# Patient Record
Sex: Male | Born: 1960 | Race: White | Hispanic: No | Marital: Married | Smoking: Former smoker
Health system: Southern US, Community
[De-identification: ages and names within clinical notes are randomized; demographics above are authoritative.]

## PROBLEM LIST (undated history)

## (undated) DIAGNOSIS — K7460 Unspecified cirrhosis of liver: Secondary | ICD-10-CM

## (undated) DIAGNOSIS — K029 Dental caries, unspecified: Secondary | ICD-10-CM

## (undated) DIAGNOSIS — K759 Inflammatory liver disease, unspecified: Secondary | ICD-10-CM

## (undated) DIAGNOSIS — F1010 Alcohol abuse, uncomplicated: Secondary | ICD-10-CM

## (undated) DIAGNOSIS — M1990 Unspecified osteoarthritis, unspecified site: Secondary | ICD-10-CM

---

## 2014-05-20 ENCOUNTER — Inpatient Hospital Stay (HOSPITAL_COMMUNITY)
Admission: AD | Admit: 2014-05-20 | Discharge: 2014-06-18 | DRG: 025 | Disposition: A | Discharge status: Hospice | Payer: Medicaid Other | Source: Other Acute Inpatient Hospital | Attending: Neurosurgery | Admitting: Neurosurgery

## 2014-05-20 DIAGNOSIS — R0902 Hypoxemia: Secondary | ICD-10-CM | POA: Insufficient documentation

## 2014-05-20 DIAGNOSIS — G936 Cerebral edema: Secondary | ICD-10-CM | POA: Diagnosis present

## 2014-05-20 DIAGNOSIS — G9389 Other specified disorders of brain: Secondary | ICD-10-CM | POA: Insufficient documentation

## 2014-05-20 DIAGNOSIS — K047 Periapical abscess without sinus: Secondary | ICD-10-CM | POA: Diagnosis present

## 2014-05-20 DIAGNOSIS — J690 Pneumonitis due to inhalation of food and vomit: Secondary | ICD-10-CM

## 2014-05-20 DIAGNOSIS — Z7901 Long term (current) use of anticoagulants: Secondary | ICD-10-CM | POA: Diagnosis not present

## 2014-05-20 DIAGNOSIS — F17210 Nicotine dependence, cigarettes, uncomplicated: Secondary | ICD-10-CM | POA: Diagnosis present

## 2014-05-20 DIAGNOSIS — F1020 Alcohol dependence, uncomplicated: Secondary | ICD-10-CM | POA: Diagnosis present

## 2014-05-20 DIAGNOSIS — K5900 Constipation, unspecified: Secondary | ICD-10-CM | POA: Diagnosis present

## 2014-05-20 DIAGNOSIS — Z515 Encounter for palliative care: Secondary | ICD-10-CM | POA: Insufficient documentation

## 2014-05-20 DIAGNOSIS — B3789 Other sites of candidiasis: Secondary | ICD-10-CM | POA: Diagnosis present

## 2014-05-20 DIAGNOSIS — C3411 Malignant neoplasm of upper lobe, right bronchus or lung: Secondary | ICD-10-CM | POA: Diagnosis present

## 2014-05-20 DIAGNOSIS — J3800 Paralysis of vocal cords and larynx, unspecified: Secondary | ICD-10-CM | POA: Diagnosis present

## 2014-05-20 DIAGNOSIS — R260 Ataxic gait: Secondary | ICD-10-CM | POA: Diagnosis present

## 2014-05-20 DIAGNOSIS — K029 Dental caries, unspecified: Secondary | ICD-10-CM | POA: Diagnosis present

## 2014-05-20 DIAGNOSIS — R739 Hyperglycemia, unspecified: Secondary | ICD-10-CM | POA: Diagnosis present

## 2014-05-20 DIAGNOSIS — G8918 Other acute postprocedural pain: Secondary | ICD-10-CM | POA: Diagnosis not present

## 2014-05-20 DIAGNOSIS — G939 Disorder of brain, unspecified: Secondary | ICD-10-CM | POA: Diagnosis present

## 2014-05-20 DIAGNOSIS — R918 Other nonspecific abnormal finding of lung field: Secondary | ICD-10-CM | POA: Insufficient documentation

## 2014-05-20 DIAGNOSIS — J9601 Acute respiratory failure with hypoxia: Secondary | ICD-10-CM | POA: Diagnosis not present

## 2014-05-20 DIAGNOSIS — K7030 Alcoholic cirrhosis of liver without ascites: Secondary | ICD-10-CM | POA: Diagnosis present

## 2014-05-20 DIAGNOSIS — Z886 Allergy status to analgesic agent status: Secondary | ICD-10-CM | POA: Diagnosis not present

## 2014-05-20 DIAGNOSIS — Z79899 Other long term (current) drug therapy: Secondary | ICD-10-CM | POA: Diagnosis not present

## 2014-05-20 DIAGNOSIS — E440 Moderate protein-calorie malnutrition: Secondary | ICD-10-CM | POA: Insufficient documentation

## 2014-05-20 DIAGNOSIS — D496 Neoplasm of unspecified behavior of brain: Secondary | ICD-10-CM

## 2014-05-20 DIAGNOSIS — Z881 Allergy status to other antibiotic agents status: Secondary | ICD-10-CM

## 2014-05-20 DIAGNOSIS — R42 Dizziness and giddiness: Secondary | ICD-10-CM

## 2014-05-20 DIAGNOSIS — D630 Anemia in neoplastic disease: Secondary | ICD-10-CM | POA: Diagnosis present

## 2014-05-20 DIAGNOSIS — R040 Epistaxis: Secondary | ICD-10-CM | POA: Diagnosis not present

## 2014-05-20 DIAGNOSIS — T380X5A Adverse effect of glucocorticoids and synthetic analogues, initial encounter: Secondary | ICD-10-CM | POA: Diagnosis present

## 2014-05-20 DIAGNOSIS — I10 Essential (primary) hypertension: Secondary | ICD-10-CM | POA: Diagnosis present

## 2014-05-20 DIAGNOSIS — B1920 Unspecified viral hepatitis C without hepatic coma: Secondary | ICD-10-CM | POA: Diagnosis present

## 2014-05-20 DIAGNOSIS — D6959 Other secondary thrombocytopenia: Secondary | ICD-10-CM | POA: Diagnosis present

## 2014-05-20 DIAGNOSIS — Z66 Do not resuscitate: Secondary | ICD-10-CM | POA: Diagnosis present

## 2014-05-20 DIAGNOSIS — J9811 Atelectasis: Secondary | ICD-10-CM | POA: Diagnosis not present

## 2014-05-20 DIAGNOSIS — C7931 Secondary malignant neoplasm of brain: Principal | ICD-10-CM | POA: Diagnosis present

## 2014-05-20 DIAGNOSIS — R451 Restlessness and agitation: Secondary | ICD-10-CM | POA: Diagnosis present

## 2014-05-20 DIAGNOSIS — R1312 Dysphagia, oropharyngeal phase: Secondary | ICD-10-CM | POA: Diagnosis present

## 2014-05-20 DIAGNOSIS — G893 Neoplasm related pain (acute) (chronic): Secondary | ICD-10-CM | POA: Diagnosis present

## 2014-05-20 DIAGNOSIS — Z452 Encounter for adjustment and management of vascular access device: Secondary | ICD-10-CM

## 2014-05-20 DIAGNOSIS — B370 Candidal stomatitis: Secondary | ICD-10-CM

## 2014-05-20 DIAGNOSIS — Z9889 Other specified postprocedural states: Secondary | ICD-10-CM

## 2014-05-20 DIAGNOSIS — Z880 Allergy status to penicillin: Secondary | ICD-10-CM | POA: Diagnosis not present

## 2014-05-20 DIAGNOSIS — C799 Secondary malignant neoplasm of unspecified site: Secondary | ICD-10-CM

## 2014-05-20 DIAGNOSIS — R7981 Abnormal blood-gas level: Secondary | ICD-10-CM

## 2014-05-20 DIAGNOSIS — M79609 Pain in unspecified limb: Secondary | ICD-10-CM | POA: Diagnosis not present

## 2014-05-20 HISTORY — DX: Inflammatory liver disease, unspecified: K75.9

## 2014-05-20 HISTORY — DX: Unspecified cirrhosis of liver: K74.60

## 2014-05-20 HISTORY — DX: Unspecified osteoarthritis, unspecified site: M19.90

## 2014-05-20 HISTORY — DX: Dental caries, unspecified: K02.9

## 2014-05-20 HISTORY — DX: Alcohol abuse, uncomplicated: F10.10

## 2014-05-20 MED ORDER — LORAZEPAM 1 MG PO TABS: 1.0000 mg | ORAL_TABLET | Freq: Four times a day (QID) | ORAL | Status: AC | PRN

## 2014-05-20 MED ORDER — FOLIC ACID 1 MG PO TABS
1.0000 mg | ORAL_TABLET | Freq: Every day | ORAL | Status: DC
  Administered 2014-05-21 – 2014-06-18 (×27): 1 mg via ORAL
  Filled 2014-05-20 (×29): qty 1

## 2014-05-20 MED ORDER — SODIUM CHLORIDE 0.9 % IV SOLN
INTRAVENOUS | Status: DC
  Administered 2014-05-20: 100 mL/h via INTRAVENOUS
  Administered 2014-05-22: 15:00:00 via INTRAVENOUS
  Administered 2014-05-22: 1000 mL via INTRAVENOUS
  Administered 2014-05-23 (×2): via INTRAVENOUS
  Administered 2014-05-24: 100 mL/h via INTRAVENOUS

## 2014-05-20 MED ORDER — VITAMIN B-1 100 MG PO TABS
100.0000 mg | ORAL_TABLET | Freq: Every day | ORAL | Status: DC
  Administered 2014-05-21 – 2014-06-18 (×27): 100 mg via ORAL
  Filled 2014-05-20 (×29): qty 1

## 2014-05-20 MED ORDER — ONDANSETRON HCL 4 MG PO TABS
4.0000 mg | ORAL_TABLET | Freq: Four times a day (QID) | ORAL | Status: DC | PRN
  Administered 2014-05-29 – 2014-06-02 (×2): 4 mg via ORAL
  Filled 2014-05-20 (×2): qty 1

## 2014-05-20 MED ORDER — THIAMINE HCL 100 MG/ML IJ SOLN
100.0000 mg | Freq: Every day | INTRAMUSCULAR | Status: DC
  Filled 2014-05-20: qty 2

## 2014-05-20 MED ORDER — ADULT MULTIVITAMIN W/MINERALS CH
1.0000 | ORAL_TABLET | Freq: Every day | ORAL | Status: DC
  Administered 2014-05-21 – 2014-06-18 (×27): 1 via ORAL
  Filled 2014-05-20 (×30): qty 1

## 2014-05-20 MED ORDER — ONDANSETRON HCL 4 MG/2ML IJ SOLN
4.0000 mg | Freq: Four times a day (QID) | INTRAMUSCULAR | Status: DC | PRN
  Administered 2014-05-20 – 2014-06-09 (×16): 4 mg via INTRAVENOUS
  Filled 2014-05-20 (×17): qty 2

## 2014-05-20 MED ORDER — LORAZEPAM 2 MG/ML IJ SOLN
1.0000 mg | Freq: Four times a day (QID) | INTRAMUSCULAR | Status: AC | PRN
  Administered 2014-05-21 (×2): 1 mg via INTRAVENOUS
  Filled 2014-05-20 (×2): qty 1

## 2014-05-20 MED ORDER — HEPARIN SODIUM (PORCINE) 5000 UNIT/ML IJ SOLN
5000.0000 [IU] | Freq: Three times a day (TID) | INTRAMUSCULAR | Status: DC
  Administered 2014-05-20 – 2014-05-31 (×29): 5000 [IU] via SUBCUTANEOUS
  Filled 2014-05-20 (×30): qty 1

## 2014-05-20 MED ORDER — MORPHINE SULFATE 2 MG/ML IJ SOLN
1.0000 mg | INTRAMUSCULAR | Status: DC | PRN
  Administered 2014-05-20 – 2014-05-30 (×39): 2 mg via INTRAVENOUS
  Administered 2014-05-31: 1 mg via INTRAVENOUS
  Administered 2014-05-31 – 2014-06-09 (×20): 2 mg via INTRAVENOUS
  Filled 2014-05-20 (×61): qty 1

## 2014-05-20 NOTE — H&P (Addendum)
Hospitalist Admission History and Physical  Patient name: Ray Garcia Medical record number: [id number] Date of birth: Feb 05, 1960 Age: 54 y.o. Gender: male  Primary Care Provider: Mliss Sax [name], MD  Chief Complaint: cerebellar mass   History of Present Illness:This is a 54 y.o. year old male with significant past medical history of cirrhosis, hepatitis C, HTN, dental caries/abscess, ETOH abuse presenting with cerebellar mass. Patient presented today at [facility] with chief complaint of recurrent intermittent headaches as well as recurrent nausea and dizziness. States that symptoms have been fairly persistent for over the past 2 weeks. No reported head trauma. Reports only minimal alcohol intake. One to 2 beers per day. Previous heavy drinker. No hemiparesis or confusion. Also with noted dental caries and dental abscess. Is currently on oral penicillin for this. Presents his [facility] afebrile, hemodynamically stable. A head CT obtained that [facility] showed abnormal appearance of the left cerebellar hemisphere concerning for underlying mass lesion with associated edema. Per report, ER physician assistant discussed case with on-call neurosurgery at [facility] recommended medical admission with formal neurosurgical consultation. Labs are pending.   Assessment and Plan: Ray Garcia is a 54 y.o. year old male presenting with cerebellar mass   Active Problems:   Cerebellar mass   1- Cerebellar mass -MRI brain w/ contrast per imaging recs -symptomatic control  -fairly benign exam  -NS consult in am   2-Cirrhosis -obtain LFTs -noted be followed at [facility]  3- Hep C -followed at [facility] -LFTs   4- HTN -pending formal vital signs and med rec  5-ETOH abuse -CIWA protocol   6-Dental Caries/Dental Abscess -cont pen VK   FEN/GI: heart healthy diet  Prophylaxis: sub q heparin  Disposition: pending further evaluation  Code Status:FUll Code    Patient Active  Problem List   Diagnosis Date Noted  . Cerebellar mass 05/20/2014   Past Medical History: 1-ETOH Abuse 2-Dental Caries   Past Surgical History: No past surgical history on file.  Social History: History   Social History  . Marital Status: Married    Spouse Name: N/A  . Number of Children: N/A  . Years of Education: N/A   Social History Main Topics  . Smoking status: Not on file  . Smokeless tobacco: Not on file  . Alcohol Use: Not on file  . Drug Use: Not on file  . Sexual Activity: Not on file   Other Topics Concern  . Not on file   Social History Narrative  . No narrative on file    Family History: Reviewed. No significant family history noted.  No family history on file.  Allergies: NKDA Allergies not on file  Current Facility-Administered Medications  Medication Dose Route Frequency Provider Last Rate Last Dose  . 0.9 %  sodium chloride infusion   Intravenous Continuous Deneise Lever, MD      . heparin injection 5,000 Units  5,000 Units Subcutaneous 3 times per day Deneise Lever, MD      . morphine 2 MG/ML injection 1-2 mg  1-2 mg Intravenous Q3H PRN Deneise Lever, MD      . ondansetron [facility]) tablet 4 mg  4 mg Oral Q6H PRN Deneise Lever, MD       Or  . ondansetron [facility]) injection 4 mg  4 mg Intravenous Q6H PRN Deneise Lever, MD       Review Of Systems: 12 point ROS negative except as noted above in HPI.  Physical Exam: Filed Vitals:  05/21/14 0541  BP: 132/83  Pulse: 62  Temp: 98.3 F (36.8 C)  Resp: 20    General: alert and cooperative HEENT: PERRLA, extra ocular movement intact and + dental caries Heart: S1, S2 normal, no murmur, rub or gallop, regular rate and rhythm Lungs: clear to auscultation, no wheezes or rales and unlabored breathing Abdomen: abdomen is soft without significant tenderness, masses, organomegaly or guarding Extremities: extremities normal, atraumatic, no cyanosis or edema Skin:no rashes Neurology:  grossly normal exam, + mild dizziness w/ ambulation   Labs and Imaging: Lab Results  Component Value Date/Time   NA 134* 05/21/2014 04:09 AM   K 3.8 05/21/2014 04:09 AM   CL 100 05/21/2014 04:09 AM   CO2 25 05/21/2014 04:09 AM   BUN 12 05/21/2014 04:09 AM   CREATININE 0.66 05/21/2014 04:09 AM   GLUCOSE 111* 05/21/2014 04:09 AM   Lab Results  Component Value Date   WBC 7.9 05/21/2014   HGB 14.3 05/21/2014   HCT 40.0 05/21/2014   MCV 88.5 05/21/2014   PLT 123* 05/21/2014    No results found.         Shanda Howells MD  Pager: [phone]

## 2014-05-21 ENCOUNTER — Inpatient Hospital Stay (HOSPITAL_COMMUNITY): Payer: Medicaid Other

## 2014-05-21 ENCOUNTER — Encounter (HOSPITAL_COMMUNITY): Payer: Self-pay | Admitting: Family Medicine

## 2014-05-21 DIAGNOSIS — I10 Essential (primary) hypertension: Secondary | ICD-10-CM

## 2014-05-21 DIAGNOSIS — F1010 Alcohol abuse, uncomplicated: Secondary | ICD-10-CM

## 2014-05-21 LAB — CBC
HCT: 40.9 % (ref 39.0–52.0)
HEMOGLOBIN: 14.6 g/dL (ref 13.0–17.0)
MCH: 31.9 pg (ref 26.0–34.0)
MCHC: 35.7 g/dL (ref 30.0–36.0)
MCV: 89.3 fL (ref 78.0–100.0)
Platelets: 124 10*3/uL — ABNORMAL LOW (ref 150–400)
RBC: 4.58 MIL/uL (ref 4.22–5.81)
RDW: 12.9 % (ref 11.5–15.5)
WBC: 7.9 10*3/uL (ref 4.0–10.5)

## 2014-05-21 LAB — COMPREHENSIVE METABOLIC PANEL
ALK PHOS: 57 U/L (ref 39–117)
ALT: 150 U/L — AB (ref 0–53)
AST: 84 U/L — ABNORMAL HIGH (ref 0–37)
Albumin: 3.4 g/dL — ABNORMAL LOW (ref 3.5–5.2)
Anion gap: 9 (ref 5–15)
BILIRUBIN TOTAL: 1.1 mg/dL (ref 0.3–1.2)
BUN: 12 mg/dL (ref 6–23)
CHLORIDE: 100 mmol/L (ref 96–112)
CO2: 25 mmol/L (ref 19–32)
Calcium: 8.7 mg/dL (ref 8.4–10.5)
Creatinine, Ser: 0.66 mg/dL (ref 0.50–1.35)
GLUCOSE: 111 mg/dL — AB (ref 70–99)
POTASSIUM: 3.8 mmol/L (ref 3.5–5.1)
Sodium: 134 mmol/L — ABNORMAL LOW (ref 135–145)
Total Protein: 6.8 g/dL (ref 6.0–8.3)

## 2014-05-21 LAB — CBC WITH DIFFERENTIAL/PLATELET
Basophils Absolute: 0 10*3/uL (ref 0.0–0.1)
Basophils Relative: 0 % (ref 0–1)
Eosinophils Absolute: 0.1 10*3/uL (ref 0.0–0.7)
Eosinophils Relative: 1 % (ref 0–5)
HEMATOCRIT: 40 % (ref 39.0–52.0)
Hemoglobin: 14.3 g/dL (ref 13.0–17.0)
Lymphocytes Relative: 32 % (ref 12–46)
Lymphs Abs: 2.5 10*3/uL (ref 0.7–4.0)
MCH: 31.6 pg (ref 26.0–34.0)
MCHC: 35.8 g/dL (ref 30.0–36.0)
MCV: 88.5 fL (ref 78.0–100.0)
MONO ABS: 0.8 10*3/uL (ref 0.1–1.0)
Monocytes Relative: 10 % (ref 3–12)
NEUTROS ABS: 4.6 10*3/uL (ref 1.7–7.7)
Neutrophils Relative %: 57 % (ref 43–77)
PLATELETS: 123 10*3/uL — AB (ref 150–400)
RBC: 4.52 MIL/uL (ref 4.22–5.81)
RDW: 12.9 % (ref 11.5–15.5)
WBC: 7.9 10*3/uL (ref 4.0–10.5)

## 2014-05-21 LAB — CREATININE, SERUM
CREATININE: 0.66 mg/dL (ref 0.50–1.35)
GFR calc Af Amer: >90 mL/min (ref >90)
GFR calc non Af Amer: >90 mL/min (ref >90)

## 2014-05-21 MED ORDER — ENSURE ENLIVE PO LIQD
237.0000 mL | Freq: Two times a day (BID) | ORAL | Status: DC
  Administered 2014-05-22 – 2014-06-09 (×28): 237 mL via ORAL

## 2014-05-21 MED ORDER — DEXAMETHASONE SODIUM PHOSPHATE 4 MG/ML IJ SOLN
4.0000 mg | Freq: Three times a day (TID) | INTRAMUSCULAR | Status: DC
  Administered 2014-05-21 – 2014-06-13 (×66): 4 mg via INTRAVENOUS
  Filled 2014-05-21 (×72): qty 1

## 2014-05-21 MED ORDER — IOHEXOL 300 MG/ML  SOLN: 25.0000 mL | INTRAMUSCULAR | Status: AC

## 2014-05-21 MED ORDER — GADOBENATE DIMEGLUMINE 529 MG/ML IV SOLN
20.0000 mL | Freq: Once | INTRAVENOUS | Status: AC
  Administered 2014-05-21: 20 mL via INTRAVENOUS

## 2014-05-21 MED ORDER — CLINDAMYCIN HCL 300 MG PO CAPS
300.0000 mg | ORAL_CAPSULE | Freq: Three times a day (TID) | ORAL | Status: DC
  Administered 2014-05-22 – 2014-05-29 (×22): 300 mg via ORAL
  Filled 2014-05-21 (×26): qty 1

## 2014-05-21 MED ORDER — IOHEXOL 300 MG/ML  SOLN
100.0000 mL | Freq: Once | INTRAMUSCULAR | Status: AC | PRN
  Administered 2014-05-21: 100 mL via INTRAVENOUS

## 2014-05-21 MED ORDER — PENICILLIN V POTASSIUM 500 MG PO TABS
500.0000 mg | ORAL_TABLET | Freq: Four times a day (QID) | ORAL | Status: DC
  Administered 2014-05-21: 500 mg via ORAL
  Filled 2014-05-21 (×6): qty 1

## 2014-05-21 MED ORDER — SACCHAROMYCES BOULARDII 250 MG PO CAPS
250.0000 mg | ORAL_CAPSULE | Freq: Two times a day (BID) | ORAL | Status: DC
  Administered 2014-05-22 – 2014-06-18 (×52): 250 mg via ORAL
  Filled 2014-05-21 (×56): qty 1

## 2014-05-21 NOTE — Progress Notes (Signed)
Pt told me this evening that he has a "violent reaction" to penicillin, becomes nauseated and vomits. Held this dose until this could be investigated further. Note sent to pharmacy.

## 2014-05-21 NOTE — Progress Notes (Signed)
Pt arrived on unit approx 2140 hrs, A&O, C/O headache 7/10 and nausea. Notified MD of Pt arrival Orders received and implemented, Pt oriented to room and equipment.

## 2014-05-21 NOTE — Progress Notes (Signed)
INITIAL NUTRITION ASSESSMENT  DOCUMENTATION CODES Per approved criteria  -Not Applicable   INTERVENTION: Ensure Enlive po BID, each supplement provides 350 kcal and 20 grams of protein RD to follow for nutrition care plan  NUTRITION DIAGNOSIS: Increased nutrient needs related to cirrhosis as evidenced by estimated nutrition needs  Goal: Pt to meet >/= 90% of their estimated nutrition needs   Monitor:  PO & supplemental intake, weight, labs, I/O's  Reason for Assessment: Malnutrition Screening Tool Report  54 y.o. male  Admitting Dx: cerebellar mass  ASSESSMENT: 54 y.o. Male transferred to [facility] from [facility] after presenting with about 2 weeks of severe daily bouts of nausea and vomiting. In addition, he has been experiencing vertigo to the point of falling also in an episodic fashion whenever he is standing. He has been having primarily posterior HA, although these have been present over the last 3 years. He denies changes in vision, N/T/W.   [facility] showed abnormal appearance of the left cerebellar hemisphere concerning for underlying mass lesion with associated edema.  Patient currently in CT-IMAGING.    Per Malnutrition Screening Tool, pt reported eating poorly because of a decreased appetite and recent weight loss without trying.  Noted nausea and vomiting PTA.  RD unable to obtain further nutrition hx at this time.  Nutrient needs increased given hx of cirrhosis, ETOH abuse.  Will order oral nutrition supplements.  Height: Ht Readings from Last 1 Encounters:  05/20/14 5' 9.6" (1.768 m)    Weight: Wt Readings from Last 1 Encounters:  05/20/14 204 lb 9.4 oz (92.8 kg)    Ideal Body Weight: 160 lb  % Ideal Body Weight: 127%  Wt Readings from Last 10 Encounters:  05/20/14 204 lb 9.4 oz (92.8 kg)    Usual Body Weight: ---  % Usual Body Weight: ---  BMI:  Body mass index is 29.69 kg/(m^2).  Estimated Nutritional  Needs: Kcal: 2100-2300 Protein: 110-120 gm Fluid: 2.1-2.3 L  Skin: Intact  Diet Order: Diet Heart Room service appropriate?: Yes; Fluid consistency:: Thin  EDUCATION NEEDS: -No education needs identified at this time  Labs:   Recent Labs Lab 05/21/14 0017 05/21/14 0409  NA  --  134*  K  --  3.8  CL  --  100  CO2  --  25  BUN  --  12  CREATININE 0.66 0.66  CALCIUM  --  8.7  GLUCOSE  --  111*    Scheduled Meds: . dexamethasone  4 mg Intravenous 3 times per day  . folic acid  1 mg Oral Daily  . heparin  5,000 Units Subcutaneous 3 times per day  . multivitamin with minerals  1 tablet Oral Daily  . penicillin v potassium  500 mg Oral 4 times per day  . thiamine  100 mg Oral Daily   Or  . thiamine  100 mg Intravenous Daily    Continuous Infusions: . sodium chloride 100 mL/hr (05/20/14 2355)    Past Medical History  Diagnosis Date  . ETOH abuse   . Dental caries     No past surgical history on file.  Arthur Holms, RD, LDN Pager #: [phone] After-Hours Pager #: [phone]

## 2014-05-21 NOTE — Progress Notes (Signed)
CARE MANAGEMENT NOTE 05/21/2014  Patient:  Ray Garcia, Ray Garcia   Account Number:  [id number]  Date Initiated:  05/21/2014  Documentation initiated by:  Lorne Skeens  Subjective/Objective Assessment:   Patient was admitted with nausea/vomiting/dizziness due to cerebellar mass.  Lives at home with his spouse. Patient is currently uninsured.     Action/Plan:   Will follow for discharge needs.   Anticipated DC Date:     Anticipated DC Plan:    In-house referral  Financial Counselor      DC Planning Services  CM consult      Choice offered to / List presented to:             Status of service:  In process, will continue to follow Medicare Important Message given?   (If response is "NO", the following Medicare IM given date fields will be blank) Date Medicare IM given:   Medicare IM given by:   Date Additional Medicare IM given:   Additional Medicare IM given by:    Discharge Disposition:    Per UR Regulation:  Reviewed for med. necessity/level of care/duration of stay  If discussed at [place] of Stay Meetings, dates discussed:    Comments:  05/21/14 [place], MSN, CM- Met with patient to discuss discharge needs. Patient is currently uninsured, but receives charity care through the [employer] at [place].  This program provides him with a PCP as well as medication assistance.  Per chart notation, patient has already been contacted by a [facility] financial counselor regarding Medicaid.  Per patient's request, CM called patient's wife Ray Garcia [phone] to relay information that was discussed.  CM will continue to follow for any additional discharge needs.

## 2014-05-21 NOTE — Progress Notes (Signed)
[facility] PROGRESS NOTE  Dewan Emond JJH:[id number] DOB: August 17, 1960 DOA: 05/20/2014 PCP: Mliss Sax [name], MD  Assessment/Plan: 1. Left cerebellar mass lesion -Patient presenting as a transfer to [facility] after initially being seen at outside hospital for complaints of nausea, vomiting, dizziness. Workup has included a CT scan of the brain which showed the presence of abnormal appearance of the left cerebellar hemisphere that could be consistent with mass lesion and associated edema. -Mass lesion and cerebellum likely to explain his symptoms. Will further workup with an MRI of brain. We'll also obtain a CT scan of chest abdomen and pelvis to assess for possible metastatic disease.  -Case was discussed with neurosurgery -Will start Decadron 4 mg IV every 8 hours as CT scan of brain showing associated edema with mass lesion in brain. -Await further recommendations from neurosurgery.  2.  Nausea/vomiting. -As mentioned above likely secondary to cerebellar mass lesion seen on brain imaging at outside hospital. -Continue supportive care, IV fluid resuscitation, IV anti-medic therapy, starting IV steroids  3.  History of cirrhosis. -Patient with history of hepatitis C and chronic alcoholism.  -Checking a CT scan of abdomen and pelvis to assess for possible metastatic disease  4.  Chronic alcoholism. -Patient reporting his last drink about a month ago, currently does not exhibit signs or symptoms of alcohol withdrawal -We'll continue monitoring closely  5.  History of hypertension -Blood pressures are stable off of antihypertensive agents will continue to monitor blood pressures over the course the day.  6.  DVT prophylaxis. Subcutaneous heparin 3 times a day  Code Status: Full Code Family Communication: Family not present Disposition Plan: Will obtain CT scan of chest abdomen and pelvis, neurosurgery  consulted   Consultants:  Neurosurgery  Procedures:    Antibiotics:    HPI/Subjective: Patient is a 54 year old woman with a past medical history of cirrhosis, hepatitis C, hypertension, history of tobacco abuse who is admitted to the medicine service on 05/20/2014. He presented as a transfer from [facility] where he initially presented with a two-week history of nausea, vomiting, dizziness, recurrent falls. A head CT performed at that facility showed abnormal appearance of the left cerebellar hemisphere consistent with mass lesion and associated edema. Patient was transferred to [facility] to undergo further workup. Case was discussed with neurosurgery who will evaluate patient today.  Objective: Filed Vitals:   05/21/14 0541  BP: 132/83  Pulse: 62  Temp: 98.3 F (36.8 C)  Resp: 20   No intake or output data in the 24 hours ending 05/21/14 0808 Filed Weights   05/20/14 2200  Weight: 92.8 kg (204 lb 9.4 oz)    Exam:   General:  Patient is awake and alert, following commands, reports ongoing headache associate with nausea.  Cardiovascular: Regular rate rhythm normal S1-S2 no murmurs rubs or gallops  Respiratory: Normal respiratory effort, lungs are clear  Abdomen: Soft nontender nondistended  Musculoskeletal: No edema  Neurological: Patient having 5 of 5 muscle strength to bilateral upper and lower extremities, no alteration to sensation. He was not ambulated  Data Reviewed: Basic Metabolic Panel:  Recent Labs Lab 05/21/14 0017 05/21/14 0409  NA  --  134*  K  --  3.8  CL  --  100  CO2  --  25  GLUCOSE  --  111*  BUN  --  12  CREATININE 0.66 0.66  CALCIUM  --  8.7   Liver Function Tests:  Recent Labs Lab 05/21/14 0409  AST 84*  ALT 150*  ALKPHOS 57  BILITOT 1.1  PROT 6.8  ALBUMIN 3.4*   No results for input(s): LIPASE, AMYLASE in the last 168 hours. No results for input(s): AMMONIA in the last 168 hours. CBC:  Recent  Labs Lab 05/21/14 0017 05/21/14 0409  WBC 7.9 7.9  NEUTROABS  --  4.6  HGB 14.6 14.3  HCT 40.9 40.0  MCV 89.3 88.5  PLT 124* 123*   Cardiac Enzymes: No results for input(s): CKTOTAL, CKMB, CKMBINDEX, TROPONINI in the last 168 hours. BNP (last 3 results) No results for input(s): BNP in the last 8760 hours.  ProBNP (last 3 results) No results for input(s): PROBNP in the last 8760 hours.  CBG: No results for input(s): GLUCAP in the last 168 hours.  No results found for this or any previous visit (from the past 240 hour(s)).   Studies: No results found.  Scheduled Meds: . dexamethasone  4 mg Intravenous 3 times per day  . folic acid  1 mg Oral Daily  . heparin  5,000 Units Subcutaneous 3 times per day  . multivitamin with minerals  1 tablet Oral Daily  . penicillin v potassium  500 mg Oral 4 times per day  . thiamine  100 mg Oral Daily   Or  . thiamine  100 mg Intravenous Daily   Continuous Infusions: . sodium chloride 100 mL/hr (05/20/14 2355)    Active Problems:   Cerebellar mass    Time spent: 40 min    Oneisha Ammons  [facility] Pager [phone]. If 7PM-7AM, please contact night-coverage at [email or web address], password [facility] 05/21/2014, 8:08 AM  LOS: 1 day

## 2014-05-21 NOTE — Consult Note (Signed)
CC:  Nausea and vomiting  HPI: Ray Garcia is a 54 y.o. male transferred to [facility] from [facility] hospital after presenting with about 2 weeks of severe daily bouts of nausea and vomiting. In addition, he has been experiencing vertigo to the point of falling also in an episodic fashion whenever he is standing. He has been having primarily posterior HA, although these have been present over the last 3 years. He denies changes in vision, N/T/W.   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 He is also a heavy smoker. He has recently been diagnosed with HCV with cirrhosis, not treated yet.  PMH: Past Medical History  Diagnosis Date  . ETOH abuse   . Dental caries     PSH: No past surgical history on file.  SH: History  Substance Use Topics  . Smoking status: Not on file  . Smokeless tobacco: Not on file  . Alcohol Use: Not on file    MEDS: Prior to Admission medications   Not on File    ALLERGY: Allergies not on file  ROS: ROS  NEUROLOGIC EXAM: Awake, alert, oriented Memory and concentration grossly intact Speech fluent, appropriate CN grossly intact, with end nystagmus on lateral gaze Motor exam: Upper Extremities Deltoid Bicep Tricep Grip  Right 5/5 5/5 5/5 5/5  Left 5/5 5/5 5/5 5/5   Lower Extremity IP Quad PF DF EHL  Right 5/5 5/5 5/5 5/5 5/5  Left 5/5 5/5 5/5 5/5 5/5   Sensation grossly intact to LT Past pointing on L  IMGAING: CTH demonstrates a left cerebellar tonsillar lesion with some surrounding edema.  CXR demonstrates a large left upper lobe mass.  IMPRESSION: - 54 y.o. 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  PLAN: - Further w/u with MRI brain w/w/o Gad - CT C/A/P - Cont decadron

## 2014-05-21 NOTE — Progress Notes (Signed)
UR complete.  Naiya Corral RN, MSN 

## 2014-05-21 NOTE — Progress Notes (Signed)
Sent page to [facility] on-call for an alternate medication to the penicillin. He has not vomited since we have held the penicillin.

## 2014-05-22 ENCOUNTER — Encounter (HOSPITAL_COMMUNITY): Payer: Self-pay | Admitting: General Practice

## 2014-05-22 DIAGNOSIS — C3411 Malignant neoplasm of upper lobe, right bronchus or lung: Secondary | ICD-10-CM | POA: Insufficient documentation

## 2014-05-22 DIAGNOSIS — Z720 Tobacco use: Secondary | ICD-10-CM

## 2014-05-22 DIAGNOSIS — R918 Other nonspecific abnormal finding of lung field: Secondary | ICD-10-CM

## 2014-05-22 DIAGNOSIS — G9389 Other specified disorders of brain: Secondary | ICD-10-CM

## 2014-05-22 DIAGNOSIS — R112 Nausea with vomiting, unspecified: Secondary | ICD-10-CM

## 2014-05-22 DIAGNOSIS — J9811 Atelectasis: Secondary | ICD-10-CM

## 2014-05-22 LAB — CBC WITH DIFFERENTIAL/PLATELET
BASOS ABS: 0 10*3/uL (ref 0.0–0.1)
BASOS PCT: 0 % (ref 0–1)
Eosinophils Absolute: 0 10*3/uL (ref 0.0–0.7)
Eosinophils Relative: 0 % (ref 0–5)
HCT: 41.7 % (ref 39.0–52.0)
HEMOGLOBIN: 15 g/dL (ref 13.0–17.0)
Lymphocytes Relative: 19 % (ref 12–46)
Lymphs Abs: 1.6 10*3/uL (ref 0.7–4.0)
MCH: 31.8 pg (ref 26.0–34.0)
MCHC: 36 g/dL (ref 30.0–36.0)
MCV: 88.5 fL (ref 78.0–100.0)
MONOS PCT: 5 % (ref 3–12)
Monocytes Absolute: 0.5 10*3/uL (ref 0.1–1.0)
NEUTROS ABS: 6.4 10*3/uL (ref 1.7–7.7)
NEUTROS PCT: 76 % (ref 43–77)
PLATELETS: 134 10*3/uL — AB (ref 150–400)
RBC: 4.71 MIL/uL (ref 4.22–5.81)
RDW: 12.6 % (ref 11.5–15.5)
WBC: 8.5 10*3/uL (ref 4.0–10.5)

## 2014-05-22 LAB — COMPREHENSIVE METABOLIC PANEL
ALT: 139 U/L — ABNORMAL HIGH (ref 0–53)
ANION GAP: 10 (ref 5–15)
AST: 67 U/L — ABNORMAL HIGH (ref 0–37)
Albumin: 3.5 g/dL (ref 3.5–5.2)
Alkaline Phosphatase: 59 U/L (ref 39–117)
BUN: 11 mg/dL (ref 6–23)
CO2: 25 mmol/L (ref 19–32)
Calcium: 9.4 mg/dL (ref 8.4–10.5)
Chloride: 101 mmol/L (ref 96–112)
Creatinine, Ser: 0.79 mg/dL (ref 0.50–1.35)
GFR calc non Af Amer: >90 mL/min (ref >90)
GLUCOSE: 138 mg/dL — AB (ref 70–99)
Potassium: 4.8 mmol/L (ref 3.5–5.1)
Sodium: 136 mmol/L (ref 135–145)
TOTAL PROTEIN: 7.1 g/dL (ref 6.0–8.3)
Total Bilirubin: 1.3 mg/dL — ABNORMAL HIGH (ref 0.3–1.2)

## 2014-05-22 LAB — PROTIME-INR
INR: 0.97 (ref 0.00–1.49)
Prothrombin Time: 13 seconds (ref 11.6–15.2)

## 2014-05-22 LAB — APTT: aPTT: 30 seconds (ref 24–37)

## 2014-05-22 MED ORDER — POLYETHYLENE GLYCOL 3350 17 G PO PACK
17.0000 g | PACK | Freq: Every day | ORAL | Status: DC
  Administered 2014-05-22 – 2014-06-14 (×20): 17 g via ORAL
  Filled 2014-05-22 (×23): qty 1

## 2014-05-22 NOTE — Progress Notes (Signed)
No issues overnight. Pt reports improvement in HA and nausea with the steroids.  EXAM:  BP 159/87 mmHg  Pulse 60  Temp(Src) 97.8 F (36.6 C) (Oral)  Resp 19  Ht 5' 9.6" (1.768 m)  Wt 92.8 kg (204 lb 9.4 oz)  BMI 29.69 kg/m2  SpO2 94%  Awake, alert, oriented  Speech fluent, appropriate  CN grossly intact  5/5 BUE/BLE   IMAGING: ~3.5cm left tonsillar enhancing mass with invasion into the medulla. There is surrounding edema which partially effaces the fourth ventricle without HCP.  IMPRESSION:  54 y.o. 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  PLAN: - Pt will likely need biopsy of lung lesion to characterize lung CA. If non-small cell, he may require resection of the cerebellar lesion. - Cont steroids

## 2014-05-22 NOTE — Progress Notes (Signed)
[facility] PROGRESS NOTE  Ray Garcia ZHY:[id number] DOB: [birth year] DOA: 05/20/2014 PCP: Soyla Murphy D, MD  Interim summary Patient is a 54 year old woman with a past medical history of cirrhosis, hepatitis C, hypertension, history of tobacco abuse who is admitted to the medicine service on 05/20/2014. He presented as a transfer from [facility] where he initially presented with a two-week history of nausea, vomiting, dizziness, recurrent falls. A head CT performed at that facility showed abnormal appearance of the left cerebellar hemisphere consistent with mass lesion and associated edema. Patient was transferred to [facility] to undergo further workup. He was further worked up with a CT scan of chest abdomen and pelvis that showed a 7.7 cm mass located in the right upper lobe. These findings were discussed with pulmonary critical care medicine who recommended after reviewing scans interventional radiology consultation for CT-guided biopsy. Per radiology findings seem consistent with primary bronchogenic neoplasm. Interventional radiology consulted.  Assessment/Plan: 1. Left cerebellar mass lesion -Patient presenting as a transfer to [facility] after initially being seen at outside hospital for complaints of nausea, vomiting, dizziness. Workup has included a CT scan of the brain which showed the presence of abnormal appearance of the left cerebellar hemisphere that could be consistent with mass lesion and associated edema. -CT scan of lungs showing a 7.7 cm posterior right upper lobe mass, making this cerebellar mass lesion suspicious for metastasis -Rad/Onc consulted.  -Symptoms appear improved after starting IV steroids  2.  Probable metastatic lung cancer -Patient having a 30-pack-year history of tobacco abuse, with CT scan of lungs showing a 7.7 cm posterior right upper lobe mass along with findings worrisome for nodal metastasis. Findings possibly compatible  with primary bronchogenic neoplasm. -Case was discussed with pulmonary critical care medicine who recommended interventional radiology consultation for CT-guided biopsy. -Interventional radiology consulted -Will need follow-up with medical oncology  2.  Nausea/vomiting. -As mentioned above likely secondary to cerebellar mass lesion seen on brain imaging at outside hospital. -Patient reporting significant improvement after the initiation of IV steroids, he is now tolerating by mouth intake  3.  History of cirrhosis. -Patient with history of hepatitis C and chronic alcoholism.  -Checking a CT scan of abdomen and pelvis to assess for possible metastatic disease  4.  Chronic alcoholism. -Patient reporting his last drink about a month ago, currently does not exhibit signs or symptoms of alcohol withdrawal -We'll continue monitoring closely  5.  History of hypertension -Blood pressures are stable off of antihypertensive agents will continue to monitor blood pressures over the course the day.  6.  DVT prophylaxis. Subcutaneous heparin 3 times a day  Code Status: Full Code Family Communication: Family not present Disposition Plan: Plan for CT-guided biopsy of lung mass   Consultants:  Neurosurgery  Pulmonary critical care medicine  Interventional radiology   HPI/Subjective: Patient states feeling much better today, reports significant improvement to nausea/vomiting, tolerating by mouth intake  Objective: Filed Vitals:   05/22/14 0600  BP: 159/87  Pulse: 60  Temp: 97.8 F (36.6 C)  Resp: 19    Intake/Output Summary (Last 24 hours) at 05/22/14 0950 Last data filed at 05/22/14 0944  Gross per 24 hour  Intake      0 ml  Output    300 ml  Net   -300 ml   Filed Weights   05/20/14 2200  Weight: 92.8 kg (204 lb 9.4 oz)    Exam:   General:  Patient is awake and alert, following commands,  today states feeling better.  Cardiovascular: Regular rate rhythm normal S1-S2 no  murmurs rubs or gallops  Respiratory: Normal respiratory effort, lungs are clear  Abdomen: Soft nontender nondistended  Musculoskeletal: No edema  Neurological: Patient having 5 of 5 muscle strength to bilateral upper and lower extremities, no alteration to sensation. He was not ambulated  Data Reviewed: Basic Metabolic Panel:  Recent Labs Lab 05/21/14 0017 05/21/14 0409 05/22/14 0458  NA  --  134* 136  K  --  3.8 4.8  CL  --  100 101  CO2  --  25 25  GLUCOSE  --  111* 138*  BUN  --  12 11  CREATININE 0.66 0.66 0.79  CALCIUM  --  8.7 9.4   Liver Function Tests:  Recent Labs Lab 05/21/14 0409 05/22/14 0458  AST 84* 67*  ALT 150* 139*  ALKPHOS 57 59  BILITOT 1.1 1.3*  PROT 6.8 7.1  ALBUMIN 3.4* 3.5   No results for input(s): LIPASE, AMYLASE in the last 168 hours. No results for input(s): AMMONIA in the last 168 hours. CBC:  Recent Labs Lab 05/21/14 0017 05/21/14 0409 05/22/14 0458  WBC 7.9 7.9 8.5  NEUTROABS  --  4.6 6.4  HGB 14.6 14.3 15.0  HCT 40.9 40.0 41.7  MCV 89.3 88.5 88.5  PLT 124* 123* 134*   Cardiac Enzymes: No results for input(s): CKTOTAL, CKMB, CKMBINDEX, TROPONINI in the last 168 hours. BNP (last 3 results) No results for input(s): BNP in the last 8760 hours.  ProBNP (last 3 results) No results for input(s): PROBNP in the last 8760 hours.  CBG: No results for input(s): GLUCAP in the last 168 hours.  No results found for this or any previous visit (from the past 240 hour(s)).   Studies: Ct Chest W Contrast  05/21/2014   CLINICAL DATA:  Cirrhosis, hepatitis C. Cerebellar mass. Lung mass on chest radiograph. Evaluate for metastatic disease.  EXAM: CT CHEST, ABDOMEN, AND PELVIS WITH CONTRAST  TECHNIQUE: Multidetector CT imaging of the chest, abdomen and pelvis was performed following the standard protocol during bolus administration of intravenous contrast.  CONTRAST:  126m OMNIPAQUE IOHEXOL 300 MG/ML  SOLN  COMPARISON:  Chest radiograph  dated 05/20/2014.  FINDINGS: CT CHEST FINDINGS  Mediastinum/Nodes: The heart is normal in size. No pericardial effusion.  Coronary atherosclerosis in the LAD.  9 mm short axis right hilar node (series 3/ image 23). Small right paratracheal nodes measuring up to 6 mm short axis (series 3/image 20). 7 mm short axis subcarinal node (series 3/ image 23).  No suspicious axillary or supraclavicular nodes.  Visualized thyroid is unremarkable.  Lungs/Pleura: Macrolobulated 6.6 x 7.7 x 6.4 cm mass in the posterior right upper lobe (series 4/image 14), which abuts the posterior chest wall/pleural surface, but does not demonstrate definite overlying osseous invasion.  No evidence of satellite nodularity or lymphangitic spread. Lungs are otherwise clear.  Mild dependent scarring/ atelectasis in the right lower lobe.  No pleural effusion or pneumothorax.  Musculoskeletal: Mild degenerative changes of the thoracic spine.  CT ABDOMEN PELVIS FINDINGS  Hepatobiliary: Cirrhosis. Mildly increased subcapsular perfusion inferiorly in thea posterior segment right hepatic lobe (series 3/image 70), which normalizes on the delayed phase, without definite underlying mass.  Cholelithiasis (series 3/ image 66). Gallbladder is underdistended. No intrahepatic or extrahepatic ductal dilatation.  Pancreas: Within normal limits.  Spleen: Splenomegaly, measuring 15.9 cm in maximal craniocaudal dimension.  Adrenals/Urinary Tract: Adrenal glands are within normal limits.  Kidneys are within normal  limits.  No hydronephrosis.  Layering excretory contrast (likely gadolinium) within the bladder.  Stomach/Bowel: Stomach is within normal limits.  No evidence of bowel obstruction.  Normal appendix.  Colonic diverticulosis, without evidence of diverticulitis.  Vascular/Lymphatic: Atherosclerotic calcifications of the abdominal aorta and branch vessels.  Small upper abdominal lymph nodes measuring up to 10 mm short axis, likely reactive.  Reproductive:  Prostate is unremarkable.  Other: Moderate fat containing left inguinal/ scrotal hernia.  Musculoskeletal: Degenerative changes of the lumbar spine.  IMPRESSION: 7.7 cm posterior right upper lobe mass which abuts the posterior chest wall/pleural surface, compatible with primary bronchogenic neoplasm.  9 mm short axis right hilar node, worrisome for nodal metastasis. Additional small right paratracheal and subcarinal nodes, indeterminate.  No evidence of metastatic disease in the abdomen/pelvis.  Additional ancillary findings as above.   Electronically Signed   By: Julian Hy M.D.   On: 05/21/2014 13:21   Mr Jeri Cos PJ Contrast  05/21/2014   CLINICAL DATA:  Dizziness with nausea and vomiting. Known brain mass.  EXAM: MRI HEAD WITHOUT AND WITH CONTRAST  TECHNIQUE: Multiplanar, multiecho pulse sequences of the brain and surrounding structures were obtained without and with intravenous contrast.  CONTRAST:  90m MULTIHANCE GADOBENATE DIMEGLUMINE 529 MG/ML IV SOLN  [facility]  FINDINGS: Calvarium and upper cervical spine: No focal marrow signal abnormality.  Orbits: No significant findings.  Sinuses: Retention cysts in the adenoid. No paranasal sinus or mastoid obstruction.  Brain: There is diffusely enhancing mass located in the lower left cerebellum with growth across the inferior cerebellar peduncle into the dorsal lateral medulla. The mass measures 32 x 25 x 37 mm. T2 hypo intensity and partial diffusion restriction suggests dense cellularity. There could be minimal blood products internally, but no measurable hematoma. No additional mass lesion is seen. Vasogenic edema surrounds the mass, with crowding in the posterior fossa and upper displacement of the cerebellum. The lower fourth ventricle it is partially effaced, but there is no hydrocephalus.  No infarct, major vessel occlusion, for significant white matter disease.  IMPRESSION: 1. 32 x 25 x 37  mm densely cellular left cerebellar mass which extends into the left inferior peduncle and medulla. Given patient's right upper lobe mass, this is most consistent with a solitary metastasis. 2. Vasogenic edema partially effaces the lower fourth ventricle. No hydrocephalus.   Electronically Signed   By: JMonte FantasiaM.D.   On: 05/21/2014 12:04   Ct Abdomen Pelvis W Contrast  05/21/2014   CLINICAL DATA:  Cirrhosis, hepatitis C. Cerebellar mass. Lung mass on chest radiograph. Evaluate for metastatic disease.  EXAM: CT CHEST, ABDOMEN, AND PELVIS WITH CONTRAST  TECHNIQUE: Multidetector CT imaging of the chest, abdomen and pelvis was performed following the standard protocol during bolus administration of intravenous contrast.  CONTRAST:  1052mOMNIPAQUE IOHEXOL 300 MG/ML  SOLN  COMPARISON:  Chest radiograph dated 05/20/2014.  FINDINGS: CT CHEST FINDINGS  Mediastinum/Nodes: The heart is normal in size. No pericardial effusion.  Coronary atherosclerosis in the LAD.  9 mm short axis right hilar node (series 3/ image 23). Small right paratracheal nodes measuring up to 6 mm short axis (series 3/image 20). 7 mm short axis subcarinal node (series 3/ image 23).  No suspicious axillary or supraclavicular nodes.  Visualized thyroid is unremarkable.  Lungs/Pleura: Macrolobulated 6.6 x 7.7 x 6.4 cm mass in the posterior right upper lobe (series 4/image 14), which abuts the posterior chest wall/pleural surface, but  does not demonstrate definite overlying osseous invasion.  No evidence of satellite nodularity or lymphangitic spread. Lungs are otherwise clear.  Mild dependent scarring/ atelectasis in the right lower lobe.  No pleural effusion or pneumothorax.  Musculoskeletal: Mild degenerative changes of the thoracic spine.  CT ABDOMEN PELVIS FINDINGS  Hepatobiliary: Cirrhosis. Mildly increased subcapsular perfusion inferiorly in thea posterior segment right hepatic lobe (series 3/image 70), which normalizes on the delayed  phase, without definite underlying mass.  Cholelithiasis (series 3/ image 66). Gallbladder is underdistended. No intrahepatic or extrahepatic ductal dilatation.  Pancreas: Within normal limits.  Spleen: Splenomegaly, measuring 15.9 cm in maximal craniocaudal dimension.  Adrenals/Urinary Tract: Adrenal glands are within normal limits.  Kidneys are within normal limits.  No hydronephrosis.  Layering excretory contrast (likely gadolinium) within the bladder.  Stomach/Bowel: Stomach is within normal limits.  No evidence of bowel obstruction.  Normal appendix.  Colonic diverticulosis, without evidence of diverticulitis.  Vascular/Lymphatic: Atherosclerotic calcifications of the abdominal aorta and branch vessels.  Small upper abdominal lymph nodes measuring up to 10 mm short axis, likely reactive.  Reproductive: Prostate is unremarkable.  Other: Moderate fat containing left inguinal/ scrotal hernia.  Musculoskeletal: Degenerative changes of the lumbar spine.  IMPRESSION: 7.7 cm posterior right upper lobe mass which abuts the posterior chest wall/pleural surface, compatible with primary bronchogenic neoplasm.  9 mm short axis right hilar node, worrisome for nodal metastasis. Additional small right paratracheal and subcarinal nodes, indeterminate.  No evidence of metastatic disease in the abdomen/pelvis.  Additional ancillary findings as above.   Electronically Signed   By: Julian Hy M.D.   On: 05/21/2014 13:21    Scheduled Meds: . clindamycin  300 mg Oral 3 times per day  . dexamethasone  4 mg Intravenous 3 times per day  . feeding supplement (ENSURE ENLIVE)  237 mL Oral BID BM  . folic acid  1 mg Oral Daily  . heparin  5,000 Units Subcutaneous 3 times per day  . multivitamin with minerals  1 tablet Oral Daily  . polyethylene glycol  17 g Oral Daily  . saccharomyces boulardii  250 mg Oral BID  . thiamine  100 mg Oral Daily   Continuous Infusions: . sodium chloride 1,000 mL (05/22/14 0455)     Active Problems:   Cerebellar mass    Time spent: 35 min    Denney Shein  [facility] Pager [phone]. If 7PM-7AM, please contact night-coverage at [email or web address], password [facility] 05/22/2014, 9:50 AM  LOS: 2 days

## 2014-05-22 NOTE — Consult Note (Addendum)
Name: Ray Garcia MRN: [id number] DOB: [birth year]    ADMISSION DATE:  05/20/2014 CONSULTATION DATE:  05/22/2014  REFERRING MD :  TRH  CHIEF COMPLAINT:  Lung mass  BRIEF PATIENT DESCRIPTION: 54 year old male with no significant PMH who presents to the hospital with headache.  Brain imaging showed a cerebellar mass.  Patient was transferred to [facility] for evaluation of brain mass.  During the evaluation process patient was noted to have a lung mass in the RUL and PCCM was called on consultation.  Patient is a smoker.  Denies cough, hemoptysis or wt loss.  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  SIGNIFICANT EVENTS    STUDIES:  Brain MRI with a cerebellar mass. Chest CT with a large right sided lung mass.   HISTORY OF PRESENT ILLNESS:  54 year old male with no significant PMH who presents to the hospital with headache.  Brain imaging showed a cerebellar mass.  Patient was transferred to [facility] for evaluation of brain mass.  During the evaluation process patient was noted to have a lung mass in the RUL and PCCM was called on consultation.  Patient is a smoker.  Denies cough, hemoptysis or wt loss.  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  PAST MEDICAL HISTORY :   has a past medical history of ETOH abuse and Dental caries.  has no past surgical history on file. Prior to Admission medications   Medication Sig Start Date End Date Taking? Authorizing Provider  dexamethasone (DECADRON) 4 MG/ML injection Inject 4 mg into the vein 3 (three) times daily.   Yes Historical Provider, MD  folic acid (FOLVITE) 1 MG tablet Take 1 mg by mouth daily.   Yes Historical Provider, MD  heparin 5000 UNIT/ML injection Inject 5,000 Units into the skin 3 (three) times daily.   Yes Historical Provider, MD  Multiple Vitamins-Minerals (MULTIVITAMIN WITH MINERALS) tablet Take 1 tablet by mouth daily.   Yes Historical Provider, MD  penicillin v potassium (VEETID) 500 MG tablet Take 500 mg by mouth 4 (four) times  daily.   Yes Historical Provider, MD  thiamine 100 MG tablet Take 100 mg by mouth daily.   Yes Historical Provider, MD   No Known Allergies  FAMILY HISTORY:  family history is not on file. SOCIAL HISTORY:    REVIEW OF SYSTEMS:   Constitutional: Negative for fever, chills, weight loss, malaise/fatigue and diaphoresis.  HENT: Negative for hearing loss, ear pain, nosebleeds, congestion, sore throat, neck pain, tinnitus and ear discharge. Does complain of intermittent headaches.  Eyes: Negative for blurred vision, double vision, photophobia, pain, discharge and redness.  Respiratory: Negative for cough, hemoptysis, sputum production, shortness of breath, wheezing and stridor.   Cardiovascular: Negative for chest pain, palpitations, orthopnea, claudication, leg swelling and PND.  Gastrointestinal: Negative for heartburn, nausea, vomiting, abdominal pain, diarrhea, constipation, blood in stool and melena.  Genitourinary: Negative for dysuria, urgency, frequency, hematuria and flank pain.  Musculoskeletal: Negative for myalgias, back pain, joint pain and falls.  Skin: Negative for itching and rash.  Neurological: Negative for dizziness, tingling, tremors, sensory change, speech change, focal weakness, seizures, loss of consciousness, weakness and headaches.  Endo/Heme/Allergies: Negative for environmental allergies and polydipsia. Does not bruise/bleed easily.  SUBJECTIVE:   VITAL SIGNS: Temp:  [97 F (36.1 C)-98.4 F (36.9 C)] 97.8 F (36.6 C) (04/28 0600) Pulse Rate:  [60-100] 60 (04/28 0600) Resp:  [18-20] 19 (04/28 0600) BP: (136-159)/(66-91) 159/87 mmHg (04/28 0600) SpO2:  [94 %-100 %]  94 % (04/28 0600)  PHYSICAL EXAMINATION: General:  Well appearing male, resting comfortably in exam bed in no acute distress. Neuro:  Alert and oriented x4, moving all ext to commands. Head: [place]/AT. EENT:  PERRL, EOM-I and MMM, -JVD. Cardiovascular:  RRR, Nl S1/S2, -M/R/G. Lungs:  CTA  bilaterally. Abdomen:  Soft, NT, ND and +BS. Musculoskeletal:  -edema and -tenderness. Skin:  Intact.   Recent Labs Lab 05/21/14 0017 05/21/14 0409 05/22/14 0458  NA  --  134* 136  K  --  3.8 4.8  CL  --  100 101  CO2  --  25 25  BUN  --  12 11  CREATININE 0.66 0.66 0.79  GLUCOSE  --  111* 138*    Recent Labs Lab 05/21/14 0017 05/21/14 0409 05/22/14 0458  HGB 14.6 14.3 15.0  HCT 40.9 40.0 41.7  WBC 7.9 7.9 8.5  PLT 124* 123* 134*   Ct Chest W Contrast  05/21/2014   CLINICAL DATA:  Cirrhosis, hepatitis C. Cerebellar mass. Lung mass on chest radiograph. Evaluate for metastatic disease.  EXAM: CT CHEST, ABDOMEN, AND PELVIS WITH CONTRAST  TECHNIQUE: Multidetector CT imaging of the chest, abdomen and pelvis was performed following the standard protocol during bolus administration of intravenous contrast.  CONTRAST:  173m OMNIPAQUE IOHEXOL 300 MG/ML  SOLN  COMPARISON:  Chest radiograph dated 05/20/2014.  FINDINGS: CT CHEST FINDINGS  Mediastinum/Nodes: The heart is normal in size. No pericardial effusion.  Coronary atherosclerosis in the LAD.  9 mm short axis right hilar node (series 3/ image 23). Small right paratracheal nodes measuring up to 6 mm short axis (series 3/image 20). 7 mm short axis subcarinal node (series 3/ image 23).  No suspicious axillary or supraclavicular nodes.  Visualized thyroid is unremarkable.  Lungs/Pleura: Macrolobulated 6.6 x 7.7 x 6.4 cm mass in the posterior right upper lobe (series 4/image 14), which abuts the posterior chest wall/pleural surface, but does not demonstrate definite overlying osseous invasion.  No evidence of satellite nodularity or lymphangitic spread. Lungs are otherwise clear.  Mild dependent scarring/ atelectasis in the right lower lobe.  No pleural effusion or pneumothorax.  Musculoskeletal: Mild degenerative changes of the thoracic spine.  CT ABDOMEN PELVIS FINDINGS  Hepatobiliary: Cirrhosis. Mildly increased subcapsular perfusion  inferiorly in thea posterior segment right hepatic lobe (series 3/image 70), which normalizes on the delayed phase, without definite underlying mass.  Cholelithiasis (series 3/ image 66). Gallbladder is underdistended. No intrahepatic or extrahepatic ductal dilatation.  Pancreas: Within normal limits.  Spleen: Splenomegaly, measuring 15.9 cm in maximal craniocaudal dimension.  Adrenals/Urinary Tract: Adrenal glands are within normal limits.  Kidneys are within normal limits.  No hydronephrosis.  Layering excretory contrast (likely gadolinium) within the bladder.  Stomach/Bowel: Stomach is within normal limits.  No evidence of bowel obstruction.  Normal appendix.  Colonic diverticulosis, without evidence of diverticulitis.  Vascular/Lymphatic: Atherosclerotic calcifications of the abdominal aorta and branch vessels.  Small upper abdominal lymph nodes measuring up to 10 mm short axis, likely reactive.  Reproductive: Prostate is unremarkable.  Other: Moderate fat containing left inguinal/ scrotal hernia.  Musculoskeletal: Degenerative changes of the lumbar spine.  IMPRESSION: 7.7 cm posterior right upper lobe mass which abuts the posterior chest wall/pleural surface, compatible with primary bronchogenic neoplasm.  9 mm short axis right hilar node, worrisome for nodal metastasis. Additional small right paratracheal and subcarinal nodes, indeterminate.  No evidence of metastatic disease in the abdomen/pelvis.  Additional ancillary findings as above.   Electronically Signed   By:  Julian Hy M.D.   On: 05/21/2014 13:21   Mr Jeri Cos TM Contrast  05/21/2014   CLINICAL DATA:  Dizziness with nausea and vomiting. Known brain mass.  EXAM: MRI HEAD WITHOUT AND WITH CONTRAST  TECHNIQUE: Multiplanar, multiecho pulse sequences of the brain and surrounding structures were obtained without and with intravenous contrast.  CONTRAST:  48m MULTIHANCE GADOBENATE DIMEGLUMINE 529 MG/ML IV SOLN  [facility]  FINDINGS: Calvarium and upper cervical spine: No focal marrow signal abnormality.  Orbits: No significant findings.  Sinuses: Retention cysts in the adenoid. No paranasal sinus or mastoid obstruction.  Brain: There is diffusely enhancing mass located in the lower left cerebellum with growth across the inferior cerebellar peduncle into the dorsal lateral medulla. The mass measures 32 x 25 x 37 mm. T2 hypo intensity and partial diffusion restriction suggests dense cellularity. There could be minimal blood products internally, but no measurable hematoma. No additional mass lesion is seen. Vasogenic edema surrounds the mass, with crowding in the posterior fossa and upper displacement of the cerebellum. The lower fourth ventricle it is partially effaced, but there is no hydrocephalus.  No infarct, major vessel occlusion, for significant white matter disease.  IMPRESSION: 1. 32 x 25 x 37 mm densely cellular left cerebellar mass which extends into the left inferior peduncle and medulla. Given patient's right upper lobe mass, this is most consistent with a solitary metastasis. 2. Vasogenic edema partially effaces the lower fourth ventricle. No hydrocephalus.   Electronically Signed   By: JMonte FantasiaM.D.   On: 05/21/2014 12:04   Ct Abdomen Pelvis W Contrast  05/21/2014   CLINICAL DATA:  Cirrhosis, hepatitis C. Cerebellar mass. Lung mass on chest radiograph. Evaluate for metastatic disease.  EXAM: CT CHEST, ABDOMEN, AND PELVIS WITH CONTRAST  TECHNIQUE: Multidetector CT imaging of the chest, abdomen and pelvis was performed following the standard protocol during bolus administration of intravenous contrast.  CONTRAST:  1035mOMNIPAQUE IOHEXOL 300 MG/ML  SOLN  COMPARISON:  Chest radiograph dated 05/20/2014.  FINDINGS: CT CHEST FINDINGS  Mediastinum/Nodes: The heart is normal in size. No pericardial effusion.  Coronary atherosclerosis in the LAD.  9 mm short axis right hilar node (series  3/ image 23). Small right paratracheal nodes measuring up to 6 mm short axis (series 3/image 20). 7 mm short axis subcarinal node (series 3/ image 23).  No suspicious axillary or supraclavicular nodes.  Visualized thyroid is unremarkable.  Lungs/Pleura: Macrolobulated 6.6 x 7.7 x 6.4 cm mass in the posterior right upper lobe (series 4/image 14), which abuts the posterior chest wall/pleural surface, but does not demonstrate definite overlying osseous invasion.  No evidence of satellite nodularity or lymphangitic spread. Lungs are otherwise clear.  Mild dependent scarring/ atelectasis in the right lower lobe.  No pleural effusion or pneumothorax.  Musculoskeletal: Mild degenerative changes of the thoracic spine.  CT ABDOMEN PELVIS FINDINGS  Hepatobiliary: Cirrhosis. Mildly increased subcapsular perfusion inferiorly in thea posterior segment right hepatic lobe (series 3/image 70), which normalizes on the delayed phase, without definite underlying mass.  Cholelithiasis (series 3/ image 66). Gallbladder is underdistended. No intrahepatic or extrahepatic ductal dilatation.  Pancreas: Within normal limits.  Spleen: Splenomegaly, measuring 15.9 cm in maximal craniocaudal dimension.  Adrenals/Urinary Tract: Adrenal glands are within normal limits.  Kidneys are within normal limits.  No hydronephrosis.  Layering excretory contrast (likely gadolinium) within the bladder.  Stomach/Bowel: Stomach is within normal limits.  No evidence of bowel  obstruction.  Normal appendix.  Colonic diverticulosis, without evidence of diverticulitis.  Vascular/Lymphatic: Atherosclerotic calcifications of the abdominal aorta and branch vessels.  Small upper abdominal lymph nodes measuring up to 10 mm short axis, likely reactive.  Reproductive: Prostate is unremarkable.  Other: Moderate fat containing left inguinal/ scrotal hernia.  Musculoskeletal: Degenerative changes of the lumbar spine.  IMPRESSION: 7.7 cm posterior right upper lobe mass which  abuts the posterior chest wall/pleural surface, compatible with primary bronchogenic neoplasm.  9 mm short axis right hilar node, worrisome for nodal metastasis. Additional small right paratracheal and subcarinal nodes, indeterminate.  No evidence of metastatic disease in the abdomen/pelvis.  Additional ancillary findings as above.   Electronically Signed   By: Julian Hy M.D.   On: 05/21/2014 13:21    ASSESSMENT / PLAN:  54 year old male smoker presenting to the hospital with headaches.  Brain imaging revealed a cerebellar mass that is likely metastatic.  Chest CT that I reviewed myself showed a lung mass.  PCCM was called on consultation for potential bronchoscopy.  I reviewed the CT myself, the mass is not approachable bronchoscopically.  However, it is close enough to the pleura and large enough that it avails itself to a CT guided biopsy.  Problem list: - Lung mass. 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 - Tobacco abuse. - Bibasilar atelectasis.  Plan: - No bronchoscopy, mass is not approachable that way. - Consult IR for a CT guided biopsy. - Smoking cessation. - Atelectasis will have RT give IS per RT protocol. - Recommend calling H/O now pending cell type. - PCCM will be available PRN.  Rush Farmer, M.D. [facility] Pulmonary[facility]Critical Care Medicine. Pager: [phone]. After hours pager: [phone].  05/22/2014, 9:17 AM

## 2014-05-22 NOTE — Clinical Social Work Note (Signed)
CSW consult acknowledged:  Clinical Social Worker received a consult for "financial difficulty with hospital bills and obtaining medications". Please refer to financial counseling to complete Medicaid application and RNCM for obtaining medications.   Clinical Social Worker will sign off for now as social work intervention is no longer needed. Please consult [place] again if new need arises.  Glendon Axe, MSW, LCSWA [phone] 05/22/2014 2:28 PM

## 2014-05-22 NOTE — Consult Note (Signed)
Reason for consult: Right lung mass biopsy  Referring Physician(s): TRH/CCM  History of Present Illness: Ray Garcia is a 54 y.o. male with a past medical history of cirrhosis, hepatitis C, hypertension, history of tobacco abuse who was admitted on 05/20/2014 as a transfer from [facility] where he initially presented with a two-week history of nausea, vomiting, dizziness, recurrent falls. A head CT performed at that facility showed abnormal appearance of the left cerebellar hemisphere consistent with mass lesion and associated edema. Patient was transferred to [facility] to undergo further workup. He underwent  a CT scan of chest abdomen and pelvis 05/21/14 that showed a 7.7 cm mass located in the right upper lobe. Pulmonary/critical care was consulted and felt that mass was not approachable bronchoscopically. Request has now been received for CT-guided right upper lobe lung mass biopsy.  Past Medical History  Diagnosis Date  . ETOH abuse   . Dental caries     No past surgical history on file.  Allergies: Review of patient's allergies indicates no known allergies.  Medications: Prior to Admission medications   Medication Sig Start Date End Date Taking? Authorizing Provider  dexamethasone (DECADRON) 4 MG/ML injection Inject 4 mg into the vein 3 (three) times daily.   Yes Historical Provider, MD  folic acid (FOLVITE) 1 MG tablet Take 1 mg by mouth daily.   Yes Historical Provider, MD  heparin 5000 UNIT/ML injection Inject 5,000 Units into the skin 3 (three) times daily.   Yes Historical Provider, MD  Multiple Vitamins-Minerals (MULTIVITAMIN WITH MINERALS) tablet Take 1 tablet by mouth daily.   Yes Historical Provider, MD  penicillin v potassium (VEETID) 500 MG tablet Take 500 mg by mouth 4 (four) times daily.   Yes Historical Provider, MD  thiamine 100 MG tablet Take 100 mg by mouth daily.   Yes Historical Provider, MD     No family history on file.  History    Social History  . Marital Status: Married    Spouse Name: N/A  . Number of Children: N/A  . Years of Education: N/A   Social History Main Topics  . Smoking status: Not on file  . Smokeless tobacco: Not on file  . Alcohol Use: Not on file  . Drug Use: Not on file  . Sexual Activity: Not on file   Other Topics Concern  . Not on file   Social History Narrative  . No narrative on file     Review of Systems see above  Vital Signs: BP 157/97 mmHg  Pulse 70  Temp(Src) 98.6 F (37 C) (Oral)  Resp 18  Ht 5' 9.6" (1.768 m)  Wt 204 lb 9.4 oz (92.8 kg)  BMI 29.69 kg/m2  SpO2 91%  Physical Exam patient awake, alert. Chest is clear to auscultation bilaterally. Heart with regular rate and rhythm. Abdomen soft, positive bowel sounds, nontender. Extremities with full range of motion and no edema.  Mallampati Score:     Imaging: Ct Chest W Contrast  05/21/2014   CLINICAL DATA:  Cirrhosis, hepatitis C. Cerebellar mass. Lung mass on chest radiograph. Evaluate for metastatic disease.  EXAM: CT CHEST, ABDOMEN, AND PELVIS WITH CONTRAST  TECHNIQUE: Multidetector CT imaging of the chest, abdomen and pelvis was performed following the standard protocol during bolus administration of intravenous contrast.  CONTRAST:  136m OMNIPAQUE IOHEXOL 300 MG/ML  SOLN  COMPARISON:  Chest radiograph dated 05/20/2014.  FINDINGS: CT CHEST FINDINGS  Mediastinum/Nodes: The heart is normal in size. No  pericardial effusion.  Coronary atherosclerosis in the LAD.  9 mm short axis right hilar node (series 3/ image 23). Small right paratracheal nodes measuring up to 6 mm short axis (series 3/image 20). 7 mm short axis subcarinal node (series 3/ image 23).  No suspicious axillary or supraclavicular nodes.  Visualized thyroid is unremarkable.  Lungs/Pleura: Macrolobulated 6.6 x 7.7 x 6.4 cm mass in the posterior right upper lobe (series 4/image 14), which abuts the posterior chest wall/pleural surface, but does not  demonstrate definite overlying osseous invasion.  No evidence of satellite nodularity or lymphangitic spread. Lungs are otherwise clear.  Mild dependent scarring/ atelectasis in the right lower lobe.  No pleural effusion or pneumothorax.  Musculoskeletal: Mild degenerative changes of the thoracic spine.  CT ABDOMEN PELVIS FINDINGS  Hepatobiliary: Cirrhosis. Mildly increased subcapsular perfusion inferiorly in thea posterior segment right hepatic lobe (series 3/image 70), which normalizes on the delayed phase, without definite underlying mass.  Cholelithiasis (series 3/ image 66). Gallbladder is underdistended. No intrahepatic or extrahepatic ductal dilatation.  Pancreas: Within normal limits.  Spleen: Splenomegaly, measuring 15.9 cm in maximal craniocaudal dimension.  Adrenals/Urinary Tract: Adrenal glands are within normal limits.  Kidneys are within normal limits.  No hydronephrosis.  Layering excretory contrast (likely gadolinium) within the bladder.  Stomach/Bowel: Stomach is within normal limits.  No evidence of bowel obstruction.  Normal appendix.  Colonic diverticulosis, without evidence of diverticulitis.  Vascular/Lymphatic: Atherosclerotic calcifications of the abdominal aorta and branch vessels.  Small upper abdominal lymph nodes measuring up to 10 mm short axis, likely reactive.  Reproductive: Prostate is unremarkable.  Other: Moderate fat containing left inguinal/ scrotal hernia.  Musculoskeletal: Degenerative changes of the lumbar spine.  IMPRESSION: 7.7 cm posterior right upper lobe mass which abuts the posterior chest wall/pleural surface, compatible with primary bronchogenic neoplasm.  9 mm short axis right hilar node, worrisome for nodal metastasis. Additional small right paratracheal and subcarinal nodes, indeterminate.  No evidence of metastatic disease in the abdomen/pelvis.  Additional ancillary findings as above.   Electronically Signed   By: Julian Hy M.D.   On: 05/21/2014 13:21    Mr Jeri Cos YQ Contrast  05/21/2014   CLINICAL DATA:  Dizziness with nausea and vomiting. Known brain mass.  EXAM: MRI HEAD WITHOUT AND WITH CONTRAST  TECHNIQUE: Multiplanar, multiecho pulse sequences of the brain and surrounding structures were obtained without and with intravenous contrast.  CONTRAST:  58m MULTIHANCE GADOBENATE DIMEGLUMINE 529 MG/ML IV SOLN  [facility]  FINDINGS: Calvarium and upper cervical spine: No focal marrow signal abnormality.  Orbits: No significant findings.  Sinuses: Retention cysts in the adenoid. No paranasal sinus or mastoid obstruction.  Brain: There is diffusely enhancing mass located in the lower left cerebellum with growth across the inferior cerebellar peduncle into the dorsal lateral medulla. The mass measures 32 x 25 x 37 mm. T2 hypo intensity and partial diffusion restriction suggests dense cellularity. There could be minimal blood products internally, but no measurable hematoma. No additional mass lesion is seen. Vasogenic edema surrounds the mass, with crowding in the posterior fossa and upper displacement of the cerebellum. The lower fourth ventricle it is partially effaced, but there is no hydrocephalus.  No infarct, major vessel occlusion, for significant white matter disease.  IMPRESSION: 1. 32 x 25 x 37 mm densely cellular left cerebellar mass which extends into the left inferior peduncle and medulla. Given patient's right upper lobe mass, this is most consistent  with a solitary metastasis. 2. Vasogenic edema partially effaces the lower fourth ventricle. No hydrocephalus.   Electronically Signed   By: Monte Fantasia M.D.   On: 05/21/2014 12:04   Ct Abdomen Pelvis W Contrast  05/21/2014   CLINICAL DATA:  Cirrhosis, hepatitis C. Cerebellar mass. Lung mass on chest radiograph. Evaluate for metastatic disease.  EXAM: CT CHEST, ABDOMEN, AND PELVIS WITH CONTRAST  TECHNIQUE: Multidetector CT imaging of the chest,  abdomen and pelvis was performed following the standard protocol during bolus administration of intravenous contrast.  CONTRAST:  128m OMNIPAQUE IOHEXOL 300 MG/ML  SOLN  COMPARISON:  Chest radiograph dated 05/20/2014.  FINDINGS: CT CHEST FINDINGS  Mediastinum/Nodes: The heart is normal in size. No pericardial effusion.  Coronary atherosclerosis in the LAD.  9 mm short axis right hilar node (series 3/ image 23). Small right paratracheal nodes measuring up to 6 mm short axis (series 3/image 20). 7 mm short axis subcarinal node (series 3/ image 23).  No suspicious axillary or supraclavicular nodes.  Visualized thyroid is unremarkable.  Lungs/Pleura: Macrolobulated 6.6 x 7.7 x 6.4 cm mass in the posterior right upper lobe (series 4/image 14), which abuts the posterior chest wall/pleural surface, but does not demonstrate definite overlying osseous invasion.  No evidence of satellite nodularity or lymphangitic spread. Lungs are otherwise clear.  Mild dependent scarring/ atelectasis in the right lower lobe.  No pleural effusion or pneumothorax.  Musculoskeletal: Mild degenerative changes of the thoracic spine.  CT ABDOMEN PELVIS FINDINGS  Hepatobiliary: Cirrhosis. Mildly increased subcapsular perfusion inferiorly in thea posterior segment right hepatic lobe (series 3/image 70), which normalizes on the delayed phase, without definite underlying mass.  Cholelithiasis (series 3/ image 66). Gallbladder is underdistended. No intrahepatic or extrahepatic ductal dilatation.  Pancreas: Within normal limits.  Spleen: Splenomegaly, measuring 15.9 cm in maximal craniocaudal dimension.  Adrenals/Urinary Tract: Adrenal glands are within normal limits.  Kidneys are within normal limits.  No hydronephrosis.  Layering excretory contrast (likely gadolinium) within the bladder.  Stomach/Bowel: Stomach is within normal limits.  No evidence of bowel obstruction.  Normal appendix.  Colonic diverticulosis, without evidence of diverticulitis.   Vascular/Lymphatic: Atherosclerotic calcifications of the abdominal aorta and branch vessels.  Small upper abdominal lymph nodes measuring up to 10 mm short axis, likely reactive.  Reproductive: Prostate is unremarkable.  Other: Moderate fat containing left inguinal/ scrotal hernia.  Musculoskeletal: Degenerative changes of the lumbar spine.  IMPRESSION: 7.7 cm posterior right upper lobe mass which abuts the posterior chest wall/pleural surface, compatible with primary bronchogenic neoplasm.  9 mm short axis right hilar node, worrisome for nodal metastasis. Additional small right paratracheal and subcarinal nodes, indeterminate.  No evidence of metastatic disease in the abdomen/pelvis.  Additional ancillary findings as above.   Electronically Signed   By: SJulian HyM.D.   On: 05/21/2014 13:21    Labs:  CBC:  Recent Labs  05/21/14 0017 05/21/14 0409 05/22/14 0458  WBC 7.9 7.9 8.5  HGB 14.6 14.3 15.0  HCT 40.9 40.0 41.7  PLT 124* 123* 134*    COAGS: No results for input(s): INR, APTT in the last 8760 hours.  BMP:  Recent Labs  05/21/14 0017 05/21/14 0409 05/22/14 0458  NA  --  134* 136  K  --  3.8 4.8  CL  --  100 101  CO2  --  25 25  GLUCOSE  --  111* 138*  BUN  --  12 11  CALCIUM  --  8.7 9.4  CREATININE 0.66  0.66 0.79  GFRNONAA >90 >90 >90  GFRAA >90 >90 >90    LIVER FUNCTION TESTS:  Recent Labs  05/21/14 0409 05/22/14 0458  BILITOT 1.1 1.3*  AST 84* 67*  ALT 150* 139*  ALKPHOS 57 59  PROT 6.8 7.1  ALBUMIN 3.4* 3.5    TUMOR MARKERS: No results for input(s): AFPTM, CEA, CA199, CHROMGRNA in the last 8760 hours.  Assessment and Plan: Patient with history of cirrhosis, hypertension, hepatitis C, tobacco/alcohol abuse, recent dizziness and falls, cerebellar mass as well as 7.7 cm posterior right upper lobe lung mass on recent imaging. Request now received for CT-guided right upper lobe lung mass biopsy. Imaging studies have been reviewed and the right  upper lobe mass is amenable to percutaneous biopsy. Details/risks of procedure, including but not limited to, internal bleeding, infection, hemoptysis, pneumothorax requiring chest tube placement, and death were discussed with patient with his understanding and consent. Procedure tentatively planned for 4/29. Heparin injections will held until after procedure.   Signed: Autumn Messing 05/22/2014, 10:53 AM   I spent a total of 20 minutes in face to face in clinical consultation, greater than 50% of which was counseling/coordinating care for CT-guided right upper lobe lung mass biopsy

## 2014-05-23 ENCOUNTER — Encounter (HOSPITAL_COMMUNITY): Payer: Self-pay

## 2014-05-23 ENCOUNTER — Inpatient Hospital Stay (HOSPITAL_COMMUNITY): Payer: Medicaid Other

## 2014-05-23 LAB — COMPREHENSIVE METABOLIC PANEL
ALT: 117 U/L — AB (ref 0–53)
ANION GAP: 9 (ref 5–15)
AST: 49 U/L — ABNORMAL HIGH (ref 0–37)
Albumin: 3.4 g/dL — ABNORMAL LOW (ref 3.5–5.2)
Alkaline Phosphatase: 57 U/L (ref 39–117)
BUN: 12 mg/dL (ref 6–23)
CALCIUM: 9.2 mg/dL (ref 8.4–10.5)
CO2: 25 mmol/L (ref 19–32)
CREATININE: 0.67 mg/dL (ref 0.50–1.35)
Chloride: 102 mmol/L (ref 96–112)
Glucose, Bld: 204 mg/dL — ABNORMAL HIGH (ref 70–99)
POTASSIUM: 4.3 mmol/L (ref 3.5–5.1)
Sodium: 136 mmol/L (ref 135–145)
TOTAL PROTEIN: 6.8 g/dL (ref 6.0–8.3)
Total Bilirubin: 0.5 mg/dL (ref 0.3–1.2)

## 2014-05-23 LAB — CBC WITH DIFFERENTIAL/PLATELET
BASOS ABS: 0 10*3/uL (ref 0.0–0.1)
BASOS PCT: 0 % (ref 0–1)
Eosinophils Absolute: 0 10*3/uL (ref 0.0–0.7)
Eosinophils Relative: 0 % (ref 0–5)
HEMATOCRIT: 40.4 % (ref 39.0–52.0)
Hemoglobin: 14.7 g/dL (ref 13.0–17.0)
Lymphocytes Relative: 14 % (ref 12–46)
Lymphs Abs: 1.6 10*3/uL (ref 0.7–4.0)
MCH: 32.1 pg (ref 26.0–34.0)
MCHC: 36.4 g/dL — ABNORMAL HIGH (ref 30.0–36.0)
MCV: 88.2 fL (ref 78.0–100.0)
MONO ABS: 0.7 10*3/uL (ref 0.1–1.0)
Monocytes Relative: 7 % (ref 3–12)
Neutro Abs: 8.7 10*3/uL — ABNORMAL HIGH (ref 1.7–7.7)
Neutrophils Relative %: 79 % — ABNORMAL HIGH (ref 43–77)
PLATELETS: 137 10*3/uL — AB (ref 150–400)
RBC: 4.58 MIL/uL (ref 4.22–5.81)
RDW: 12.6 % (ref 11.5–15.5)
WBC: 11.1 10*3/uL — ABNORMAL HIGH (ref 4.0–10.5)

## 2014-05-23 MED ORDER — MIDAZOLAM HCL 2 MG/2ML IJ SOLN
INTRAMUSCULAR | Status: AC
Start: 2014-05-23 — End: 2014-05-23
  Filled 2014-05-23: qty 4

## 2014-05-23 MED ORDER — FENTANYL CITRATE (PF) 100 MCG/2ML IJ SOLN
INTRAMUSCULAR | Status: AC | PRN
  Administered 2014-05-23 (×2): 50 ug via INTRAVENOUS

## 2014-05-23 MED ORDER — FENTANYL CITRATE (PF) 100 MCG/2ML IJ SOLN
INTRAMUSCULAR | Status: AC
  Filled 2014-05-23: qty 4

## 2014-05-23 MED ORDER — MIDAZOLAM HCL 2 MG/2ML IJ SOLN
INTRAMUSCULAR | Status: AC | PRN
  Administered 2014-05-23 (×2): 1 mg via INTRAVENOUS

## 2014-05-23 MED ORDER — LIDOCAINE HCL 1 % IJ SOLN
INTRAMUSCULAR | Status: AC
  Filled 2014-05-23: qty 20

## 2014-05-23 NOTE — Procedures (Signed)
RUL lung Bx 18 g core times 3 No comp

## 2014-05-23 NOTE — Sedation Documentation (Signed)
Transport requested. Pt moved to his bed w/out difficulty, no c/o pain.

## 2014-05-23 NOTE — Progress Notes (Signed)
Pt arrived to unit at 9:58am alert, verbal with no noted distress. Pt stable, neuro intact. Pt returned from biopsy, report called in by nurse Sonia Baller. Biopsy site to upper back with bandaid dry and intact. Pt denies pain but stated that when he moves site aches. Will continue to monitor.

## 2014-05-23 NOTE — Clinical Documentation Improvement (Signed)
H&P: "showed abnormal appearance of the left cerebellar hemisphere concerning for underlying mass lesion with associated edema" MR Brain: Vasogenic edema surrounds the mass, with crowding in the posterior fossa andupper displacement of the cerebellum. Vasogenic edema partially effaces the lower fourth ventricle. No hydrocephalus.  Please clarify and document in progress notes / discharge summary  if clinical condition associated with below.  Possible Conditions ____cerebral edema ____vasogenic edema Other Not able to determine  Risk Factors:Known brain mass. Sign & Symptoms: nausea, vomiting, headaches, dizziness Diagnostics:MR Brain  Treatment: Neuro consult,  Thank you,  Philippa Chester ,RN Clinical Documentation Specialist:  [facility] [employer]

## 2014-05-23 NOTE — Progress Notes (Signed)
[facility]                                                                              Patient Demographics  Ray Garcia, is a 54 y.o. male, DOB - 1960-04-06, TKP:[id number]  Admit date - 05/20/2014   Admitting Physician Lavina Hamman, MD  Outpatient Primary MD for the patient is [name], [name], MD  LOS - 3   No chief complaint on file.      Brief HPI   Patient is a 54 year old male with cirrhosis, hepatitis C, hypertension, history of tobacco abuse presented as a transfer from [facility] where he initially presented with a two-week history of nausea, vomiting, dizziness, recurrent falls. A head CT performed at that facility showed abnormal appearance of the left cerebellar hemisphere consistent with mass lesion and associated edema. Patient was transferred to [facility] to undergo further workup.  He was further worked up with a CT scan of chest abdomen and pelvis that showed a 7.7 cm mass located in the right upper lobe lung. These findings were discussed with pulmonary critical care medicine who recommended after reviewing scans interventional radiology consultation for CT-guided biopsy. Per radiology findings seem consistent with primary bronchogenic neoplasm. Interventional radiology consulted.   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  -Started on IV steroids, per neurosurgery, Dr. Kathyrn Sheriff, if non-small cell CA, may require resection of the cerebellar lesion.   Active problems  Probable metastatic lung cancer -Patient has a 30-pack-year smoking history, CT chest showed 7.7 cm posterior right  upper lobe mass compatible with primary bronchogenic neoplasm. - IR consult obtained, patient underwent CT-guided biopsy on 4/29, will consult oncology once biopsy results are available.  Nausea/vomiting. -As mentioned above likely secondary to cerebellar mass lesion seen on brain imaging at outside hospital significantly improved after IV steroids., .   liver cirrhosis: Likely due to hepatitis C and chronic alcoholism - CT abdomen and pelvis showed no evidence of metastatic disease in the abdomen and pelvis.   Chronic alcoholism. -Patient reported that his last drink about a month ago, currently does not exhibit signs or symptoms of alcohol withdrawal  Essential hypertension  - Currently stable  Code Status:Full code  Family Communication: Discussed in detail with the patient, all imaging results, lab results explained to the patient    Disposition Plan: Pending workup   Time Spent in minutes   25 minutes  Procedures  CT chest, abdomen and pelvis  Consults  Critical care medicine   IR Neurosurgery  DVT Prophylaxis heparin   Medications  Scheduled Meds: . clindamycin  300 mg Oral 3 times per day  . dexamethasone  4 mg Intravenous 3 times per day  . feeding supplement (ENSURE ENLIVE)  237 mL Oral BID BM  . fentaNYL      . folic acid  1 mg Oral Daily  . heparin  5,000 Units Subcutaneous 3 times per day  . lidocaine      . midazolam      . multivitamin with minerals  1 tablet Oral Daily  . polyethylene glycol  17 g Oral Daily  . saccharomyces boulardii  250 mg Oral BID  . thiamine  100 mg Oral Daily   Continuous Infusions: . sodium chloride 100 mL/hr at 05/23/14 0111   PRN Meds:.LORazepam **OR** LORazepam, morphine injection, ondansetron **OR** ondansetron (ZOFRAN) IV   Antibiotics   Anti-infectives    Start     Dose/Rate Route Frequency Ordered Stop   05/22/14 0015  clindamycin (CLEOCIN) capsule 300 mg     300 mg Oral 3 times per day 05/21/14 2359      05/21/14 0645  penicillin v potassium (VEETID) tablet 500 mg  Status:  Discontinued     500 mg Oral 4 times per day 05/21/14 3220 05/21/14 2358        Subjective:   Ray Garcia was seen and examined today. Patient denies dizziness, chest pain, shortness of breath, abdominal pain, N/V/D/C, new weakness, numbess, tingling. No acute events overnight.    Objective:   Blood pressure 148/91, pulse 79, temperature 97.8 F (36.6 C), temperature source Oral, resp. rate 20, height 5' 9.6" (1.768 m), weight 92.8 kg (204 lb 9.4 oz), SpO2 99 %.  Wt Readings from Last 3 Encounters:  05/20/14 92.8 kg (204 lb 9.4 oz)     Intake/Output Summary (Last 24 hours) at 05/23/14 1417 Last data filed at 05/23/14 0925  Gross per 24 hour  Intake      0 ml  Output    325 ml  Net   -325 ml    Exam  General: Alert and oriented x 3, NAD  HEENT:  PERRLA, EOMI, Anicteic Sclera, mucous membranes moist.   Neck: Supple, no JVD, no masses  CVS: S1 S2 auscultated, no rubs, murmurs or gallops. Regular rate and rhythm.  Respiratory: Clear to auscultation bilaterally, no wheezing, rales or rhonchi  Abdomen: Soft, nontender, nondistended, + bowel sounds  Ext: no cyanosis clubbing or edema  Neuro: AAOx3, Cr N's II- XII. Strength 5/5 upper and lower extremities bilaterally  Skin: No rashes  Psych: Normal affect and demeanor, alert and oriented x3    Data Review   Micro Results No results found for this or any previous visit (from the past 240 hour(s)).  Radiology Reports Ct Chest W Contrast  05/21/2014   CLINICAL DATA:  Cirrhosis, hepatitis C. Cerebellar mass. Lung mass on chest radiograph. Evaluate for metastatic disease.  EXAM: CT CHEST, ABDOMEN, AND PELVIS WITH CONTRAST  TECHNIQUE: Multidetector CT imaging of the chest, abdomen and pelvis was performed following the standard protocol during bolus administration of intravenous contrast.  CONTRAST:  14m OMNIPAQUE IOHEXOL 300 MG/ML  SOLN   COMPARISON:  Chest radiograph dated 05/20/2014.  FINDINGS: CT CHEST FINDINGS  Mediastinum/Nodes: The heart is normal in size. No pericardial effusion.  Coronary atherosclerosis in the LAD.  9 mm short axis right hilar node (series 3/ image 23). Small right paratracheal nodes measuring up to 6 mm short axis (series 3/image 20). 7 mm short axis subcarinal node (series 3/ image 23).  No suspicious axillary or supraclavicular nodes.  Visualized thyroid is unremarkable.  Lungs/Pleura: Macrolobulated 6.6 x 7.7 x 6.4 cm mass in the posterior right upper lobe (series 4/image 14), which abuts the posterior chest wall/pleural surface, but does not demonstrate definite overlying  osseous invasion.  No evidence of satellite nodularity or lymphangitic spread. Lungs are otherwise clear.  Mild dependent scarring/ atelectasis in the right lower lobe.  No pleural effusion or pneumothorax.  Musculoskeletal: Mild degenerative changes of the thoracic spine.  CT ABDOMEN PELVIS FINDINGS  Hepatobiliary: Cirrhosis. Mildly increased subcapsular perfusion inferiorly in thea posterior segment right hepatic lobe (series 3/image 70), which normalizes on the delayed phase, without definite underlying mass.  Cholelithiasis (series 3/ image 66). Gallbladder is underdistended. No intrahepatic or extrahepatic ductal dilatation.  Pancreas: Within normal limits.  Spleen: Splenomegaly, measuring 15.9 cm in maximal craniocaudal dimension.  Adrenals/Urinary Tract: Adrenal glands are within normal limits.  Kidneys are within normal limits.  No hydronephrosis.  Layering excretory contrast (likely gadolinium) within the bladder.  Stomach/Bowel: Stomach is within normal limits.  No evidence of bowel obstruction.  Normal appendix.  Colonic diverticulosis, without evidence of diverticulitis.  Vascular/Lymphatic: Atherosclerotic calcifications of the abdominal aorta and branch vessels.  Small upper abdominal lymph nodes measuring up to 10 mm short axis, likely  reactive.  Reproductive: Prostate is unremarkable.  Other: Moderate fat containing left inguinal/ scrotal hernia.  Musculoskeletal: Degenerative changes of the lumbar spine.  IMPRESSION: 7.7 cm posterior right upper lobe mass which abuts the posterior chest wall/pleural surface, compatible with primary bronchogenic neoplasm.  9 mm short axis right hilar node, worrisome for nodal metastasis. Additional small right paratracheal and subcarinal nodes, indeterminate.  No evidence of metastatic disease in the abdomen/pelvis.  Additional ancillary findings as above.   Electronically Signed   By: Julian Hy M.D.   On: 05/21/2014 13:21   Mr Jeri Cos VQ Contrast  05/21/2014   CLINICAL DATA:  Dizziness with nausea and vomiting. Known brain mass.  EXAM: MRI HEAD WITHOUT AND WITH CONTRAST  TECHNIQUE: Multiplanar, multiecho pulse sequences of the brain and surrounding structures were obtained without and with intravenous contrast.  CONTRAST:  37m MULTIHANCE GADOBENATE DIMEGLUMINE 529 MG/ML IV SOLN  [facility]  FINDINGS: Calvarium and upper cervical spine: No focal marrow signal abnormality.  Orbits: No significant findings.  Sinuses: Retention cysts in the adenoid. No paranasal sinus or mastoid obstruction.  Brain: There is diffusely enhancing mass located in the lower left cerebellum with growth across the inferior cerebellar peduncle into the dorsal lateral medulla. The mass measures 32 x 25 x 37 mm. T2 hypo intensity and partial diffusion restriction suggests dense cellularity. There could be minimal blood products internally, but no measurable hematoma. No additional mass lesion is seen. Vasogenic edema surrounds the mass, with crowding in the posterior fossa and upper displacement of the cerebellum. The lower fourth ventricle it is partially effaced, but there is no hydrocephalus.  No infarct, major vessel occlusion, for significant white matter disease.   IMPRESSION: 1. 32 x 25 x 37 mm densely cellular left cerebellar mass which extends into the left inferior peduncle and medulla. Given patient's right upper lobe mass, this is most consistent with a solitary metastasis. 2. Vasogenic edema partially effaces the lower fourth ventricle. No hydrocephalus.   Electronically Signed   By: JMonte FantasiaM.D.   On: 05/21/2014 12:04   Ct Abdomen Pelvis W Contrast  05/21/2014   CLINICAL DATA:  Cirrhosis, hepatitis C. Cerebellar mass. Lung mass on chest radiograph. Evaluate for metastatic disease.  EXAM: CT CHEST, ABDOMEN, AND PELVIS WITH CONTRAST  TECHNIQUE: Multidetector CT imaging of the chest, abdomen and pelvis was performed following the standard protocol during bolus administration  of intravenous contrast.  CONTRAST:  152m OMNIPAQUE IOHEXOL 300 MG/ML  SOLN  COMPARISON:  Chest radiograph dated 05/20/2014.  FINDINGS: CT CHEST FINDINGS  Mediastinum/Nodes: The heart is normal in size. No pericardial effusion.  Coronary atherosclerosis in the LAD.  9 mm short axis right hilar node (series 3/ image 23). Small right paratracheal nodes measuring up to 6 mm short axis (series 3/image 20). 7 mm short axis subcarinal node (series 3/ image 23).  No suspicious axillary or supraclavicular nodes.  Visualized thyroid is unremarkable.  Lungs/Pleura: Macrolobulated 6.6 x 7.7 x 6.4 cm mass in the posterior right upper lobe (series 4/image 14), which abuts the posterior chest wall/pleural surface, but does not demonstrate definite overlying osseous invasion.  No evidence of satellite nodularity or lymphangitic spread. Lungs are otherwise clear.  Mild dependent scarring/ atelectasis in the right lower lobe.  No pleural effusion or pneumothorax.  Musculoskeletal: Mild degenerative changes of the thoracic spine.  CT ABDOMEN PELVIS FINDINGS  Hepatobiliary: Cirrhosis. Mildly increased subcapsular perfusion inferiorly in thea posterior segment right hepatic lobe (series 3/image 70), which  normalizes on the delayed phase, without definite underlying mass.  Cholelithiasis (series 3/ image 66). Gallbladder is underdistended. No intrahepatic or extrahepatic ductal dilatation.  Pancreas: Within normal limits.  Spleen: Splenomegaly, measuring 15.9 cm in maximal craniocaudal dimension.  Adrenals/Urinary Tract: Adrenal glands are within normal limits.  Kidneys are within normal limits.  No hydronephrosis.  Layering excretory contrast (likely gadolinium) within the bladder.  Stomach/Bowel: Stomach is within normal limits.  No evidence of bowel obstruction.  Normal appendix.  Colonic diverticulosis, without evidence of diverticulitis.  Vascular/Lymphatic: Atherosclerotic calcifications of the abdominal aorta and branch vessels.  Small upper abdominal lymph nodes measuring up to 10 mm short axis, likely reactive.  Reproductive: Prostate is unremarkable.  Other: Moderate fat containing left inguinal/ scrotal hernia.  Musculoskeletal: Degenerative changes of the lumbar spine.  IMPRESSION: 7.7 cm posterior right upper lobe mass which abuts the posterior chest wall/pleural surface, compatible with primary bronchogenic neoplasm.  9 mm short axis right hilar node, worrisome for nodal metastasis. Additional small right paratracheal and subcarinal nodes, indeterminate.  No evidence of metastatic disease in the abdomen/pelvis.  Additional ancillary findings as above.   Electronically Signed   By: SJulian HyM.D.   On: 05/21/2014 13:21   Ct Biopsy  05/23/2014   CLINICAL DATA:  Right upper lobe mass.  Intracranial lesion.  EXAM: CT-GUIDED BIOPSY OF A RIGHT UPPER LOBE MASS.  CORE.  MEDICATIONS AND MEDICAL HISTORY: Versed 2 mg, Fentanyl 100 mcg.  Additional Medications: None.  ANESTHESIA/SEDATION: Moderate sedation time: 10 minutes  PROCEDURE: The procedure, risks, benefits, and alternatives were explained to the patient. Questions regarding the procedure were encouraged and answered. The patient understands and  consents to the procedure.  The right upper posterior thorax was prepped with Betadine in a sterile fashion, and a sterile drape was applied covering the operative field. A sterile gown and sterile gloves were used for the procedure.  Under CT guidance, a(n) 17 gauge guide needle was advanced into the right upper lobe lung mass. Subsequently 3 18 gauge core biopsies were obtained. The guide needle was removed. Final imaging was performed.  Patient tolerated the procedure well without complication. Vital sign monitoring by nursing staff during the procedure will continue as patient is in the special procedures unit for post procedure observation.  FINDINGS: The images document guide needle placement within the right upper lobe lung mass. Post biopsy images demonstrate no pneumothorax.  COMPLICATIONS: None  IMPRESSION: Successful CT-guided right upper lobe lung mass core biopsy.   Electronically Signed   By: Marybelle Killings M.D.   On: 05/23/2014 14:13    CBC  Recent Labs Lab 05/21/14 0017 05/21/14 0409 05/22/14 0458 05/23/14 0431  WBC 7.9 7.9 8.5 11.1*  HGB 14.6 14.3 15.0 14.7  HCT 40.9 40.0 41.7 40.4  PLT 124* 123* 134* 137*  MCV 89.3 88.5 88.5 88.2  MCH 31.9 31.6 31.8 32.1  MCHC 35.7 35.8 36.0 36.4*  RDW 12.9 12.9 12.6 12.6  LYMPHSABS  --  2.5 1.6 1.6  MONOABS  --  0.8 0.5 0.7  EOSABS  --  0.1 0.0 0.0  BASOSABS  --  0.0 0.0 0.0    Chemistries   Recent Labs Lab 05/21/14 0017 05/21/14 0409 05/22/14 0458 05/23/14 0431  NA  --  134* 136 136  K  --  3.8 4.8 4.3  CL  --  100 101 102  CO2  --  '25 25 25  '$ GLUCOSE  --  111* 138* 204*  BUN  --  '12 11 12  '$ CREATININE 0.66 0.66 0.79 0.67  CALCIUM  --  8.7 9.4 9.2  AST  --  84* 67* 49*  ALT  --  150* 139* 117*  ALKPHOS  --  57 59 57  BILITOT  --  1.1 1.3* 0.5   ------------------------------------------------------------------------------------------------------------------ estimated creatinine clearance is 118.6 mL/min (by C-G formula  based on Cr of 0.67). ------------------------------------------------------------------------------------------------------------------ No results for input(s): HGBA1C in the last 72 hours. ------------------------------------------------------------------------------------------------------------------ No results for input(s): CHOL, HDL, LDLCALC, TRIG, CHOLHDL, LDLDIRECT in the last 72 hours. ------------------------------------------------------------------------------------------------------------------ No results for input(s): TSH, T4TOTAL, T3FREE, THYROIDAB in the last 72 hours.  Invalid input(s): FREET3 ------------------------------------------------------------------------------------------------------------------ No results for input(s): VITAMINB12, FOLATE, FERRITIN, TIBC, IRON, RETICCTPCT in the last 72 hours.  Coagulation profile  Recent Labs Lab 05/22/14 1214  INR 0.97    No results for input(s): DDIMER in the last 72 hours.  Cardiac Enzymes No results for input(s): CKMB, TROPONINI, MYOGLOBIN in the last 168 hours.  Invalid input(s): CK ------------------------------------------------------------------------------------------------------------------ Invalid input(s): POCBNP  No results for input(s): GLUCAP in the last 72 hours.   Branton Einstein M.D. [facility] 05/23/2014, 2:17 PM  Pager: [phone]   Between 7am to 7pm - call Pager - [phone]  After 7pm go to [email or web address] - password [email or web address]  Call night coverage person covering after 7pm

## 2014-05-23 NOTE — Sedation Documentation (Signed)
Pt is not moaning or c/o pain at this time but does not appear drowsy at all. Discussed continued elevated Bp and alertness of patient w/ Dr. Barbie Banner.

## 2014-05-23 NOTE — Sedation Documentation (Signed)
Pt listed as having no allergies but stated he is allergic to Ibuprofen and Penicillin.  Added to chart.

## 2014-05-23 NOTE — Sedation Documentation (Signed)
Dr. Barbie Banner obtaining samples. Pt tolerating well. VSS.

## 2014-05-23 NOTE — Sedation Documentation (Signed)
Transport finally arrived. Pt alert and in no pain.

## 2014-05-23 NOTE — Sedation Documentation (Signed)
Patient is resting comfortably. 

## 2014-05-23 NOTE — Sedation Documentation (Signed)
Dr. Barbie Banner in to speak with patient who stated he is very scared about procedure. Risks and benefits explained. Questions answered and reassurance given.  Pt v/u.

## 2014-05-24 LAB — COMPREHENSIVE METABOLIC PANEL
ALT: 125 U/L — ABNORMAL HIGH (ref 0–53)
AST: 56 U/L — AB (ref 0–37)
Albumin: 3.4 g/dL — ABNORMAL LOW (ref 3.5–5.2)
Alkaline Phosphatase: 61 U/L (ref 39–117)
Anion gap: 7 (ref 5–15)
BUN: 16 mg/dL (ref 6–23)
CALCIUM: 9.3 mg/dL (ref 8.4–10.5)
CO2: 27 mmol/L (ref 19–32)
Chloride: 100 mmol/L (ref 96–112)
Creatinine, Ser: 0.7 mg/dL (ref 0.50–1.35)
GFR calc Af Amer: >90 mL/min (ref >90)
Glucose, Bld: 175 mg/dL — ABNORMAL HIGH (ref 70–99)
Potassium: 4.6 mmol/L (ref 3.5–5.1)
SODIUM: 134 mmol/L — AB (ref 135–145)
Total Bilirubin: 0.8 mg/dL (ref 0.3–1.2)
Total Protein: 7 g/dL (ref 6.0–8.3)

## 2014-05-24 LAB — CBC WITH DIFFERENTIAL/PLATELET
BASOS PCT: 0 % (ref 0–1)
Basophils Absolute: 0 10*3/uL (ref 0.0–0.1)
Eosinophils Absolute: 0 10*3/uL (ref 0.0–0.7)
Eosinophils Relative: 0 % (ref 0–5)
HCT: 41.8 % (ref 39.0–52.0)
Hemoglobin: 15 g/dL (ref 13.0–17.0)
LYMPHS ABS: 2.2 10*3/uL (ref 0.7–4.0)
Lymphocytes Relative: 19 % (ref 12–46)
MCH: 32 pg (ref 26.0–34.0)
MCHC: 35.9 g/dL (ref 30.0–36.0)
MCV: 89.1 fL (ref 78.0–100.0)
Monocytes Absolute: 0.7 10*3/uL (ref 0.1–1.0)
Monocytes Relative: 6 % (ref 3–12)
NEUTROS ABS: 8.7 10*3/uL — AB (ref 1.7–7.7)
Neutrophils Relative %: 75 % (ref 43–77)
Platelets: 136 10*3/uL — ABNORMAL LOW (ref 150–400)
RBC: 4.69 MIL/uL (ref 4.22–5.81)
RDW: 12.9 % (ref 11.5–15.5)
WBC: 11.6 10*3/uL — AB (ref 4.0–10.5)

## 2014-05-24 MED ORDER — DOCUSATE SODIUM 100 MG PO CAPS
100.0000 mg | ORAL_CAPSULE | Freq: Two times a day (BID) | ORAL | Status: DC
  Administered 2014-05-24 – 2014-06-14 (×32): 100 mg via ORAL
  Filled 2014-05-24 (×45): qty 1

## 2014-05-24 MED ORDER — FLEET ENEMA 7-19 GM/118ML RE ENEM
1.0000 | ENEMA | Freq: Once | RECTAL | Status: AC
  Administered 2014-05-24: 1 via RECTAL
  Filled 2014-05-24: qty 1

## 2014-05-24 MED ORDER — BISACODYL 10 MG RE SUPP
10.0000 mg | Freq: Once | RECTAL | Status: AC
  Administered 2014-05-24: 10 mg via RECTAL
  Filled 2014-05-24: qty 1

## 2014-05-24 NOTE — Progress Notes (Signed)
No new issues from a neurosurgical standpoint. Lung biopsy pending. Depending results on lung biopsy decision will be made with regard to resection of cerebellar tumor.

## 2014-05-24 NOTE — Progress Notes (Signed)
[facility]                                                                              Patient Demographics  Ray Garcia, is a 54 y.o. male, DOB - May 01, 1960, [id number]  Admit date - 05/20/2014   Admitting Physician Lavina Hamman, MD  Outpatient Primary MD for the patient is [name], [name], MD  LOS - 4   No chief complaint on file.      Brief HPI   Patient is a 54 year old male with cirrhosis, hepatitis C, hypertension, history of tobacco abuse presented as a transfer from [facility] where he initially presented with a two-week history of nausea, vomiting, dizziness, recurrent falls. A head CT performed at that facility showed abnormal appearance of the left cerebellar hemisphere consistent with mass lesion and associated edema. Patient was transferred to [facility] to undergo further workup.  He was further worked up with a CT scan of chest abdomen and pelvis that showed a 7.7 cm mass located in the right upper lobe lung. These findings were discussed with pulmonary critical care medicine who recommended after reviewing scans interventional radiology consultation for CT-guided biopsy. Per radiology findings seem consistent with primary bronchogenic neoplasm. Interventional radiology consulted.   Assessment & Plan    Principal problem Left cerebellar mass lesion with vasogenic edema - CT of the brain showed abnormal appearance of left cerebellar hemisphere consistent with mass lesion and vasogenic edema. CT scan of lungs showing a 7.7 cm posterior right upper lobe mass, making this cerebellar mass lesion suspicious for metastasis -Rad/Onc and neurosurgery consulted consulted.  -Continue IV steroids, per neurosurgery, Dr. Kathyrn Sheriff, if non-small cell CA, may require resection of the cerebellar lesion.   Active problems  Probable metastatic lung cancer -Patient has a 30-pack-year smoking history, CT chest showed 7.7 cm posterior right upper  lobe mass compatible with primary bronchogenic neoplasm. -Patient underwent CT-guided biopsy 4/29, results still pending. Further management per neurosurgery depends on the biopsy results, will need oncology consult after biopsy results are available  Nausea/vomiting. -As mentioned above likely secondary to cerebellar mass lesion seen on brain imaging at outside hospital significantly improved after IV steroids   liver cirrhosis: Likely due to hepatitis C and chronic alcoholism - CT abdomen and pelvis showed no evidence of metastatic disease in the abdomen and pelvis.   Chronic alcoholism. -Patient reported that his last drink about a month ago, currently does not exhibit signs or symptoms of alcohol withdrawal  Essential hypertension  - Currently stable  Constipation - Added MiraLAX, Colace and Dulcolax suppository  Code Status:Full code  Family Communication: Discussed in detail with the patient, all imaging results, lab results explained to the patient    Disposition Plan: Pending workup   Time Spent in minutes   25 minutes  Procedures  CT chest, abdomen and pelvis  Consults  Critical care medicine   IR Neurosurgery  DVT Prophylaxis heparin   Medications  Scheduled Meds: . clindamycin  300 mg Oral 3 times per day  . dexamethasone  4 mg Intravenous 3 times per day  . docusate sodium  100 mg Oral  BID  . feeding supplement (ENSURE ENLIVE)  237 mL Oral BID BM  . folic acid  1 mg Oral Daily  . heparin  5,000 Units Subcutaneous 3 times per day  . multivitamin with minerals  1 tablet Oral Daily  . polyethylene glycol  17 g Oral Daily  . saccharomyces boulardii  250 mg Oral BID  . thiamine  100 mg Oral Daily   Continuous Infusions:   PRN Meds:.morphine injection, ondansetron **OR** ondansetron (ZOFRAN) IV   Antibiotics   Anti-infectives    Start     Dose/Rate Route Frequency Ordered Stop   05/22/14 0015  clindamycin (CLEOCIN) capsule 300 mg     300 mg Oral 3  times per day 05/21/14 2359     05/21/14 0645  penicillin v potassium (VEETID) tablet 500 mg  Status:  Discontinued     500 mg Oral 4 times per day 05/21/14 0272 05/21/14 2358        Subjective:   Ray Garcia was seen and examined today. Patient denies dizziness, chest pain, shortness of breath, abdominal pain, N/V, new weakness, numbess, tingling. No acute events overnight. Complaining of constipation   Objective:   Blood pressure 148/87, pulse 58, temperature 97.8 F (36.6 C), temperature source Oral, resp. rate 20, height 5' 9.6" (1.768 m), weight 92.8 kg (204 lb 9.4 oz), SpO2 97 %.  Wt Readings from Last 3 Encounters:  05/20/14 92.8 kg (204 lb 9.4 oz)     Intake/Output Summary (Last 24 hours) at 05/24/14 1207 Last data filed at 05/24/14 0531  Gross per 24 hour  Intake      0 ml  Output   2525 ml  Net  -2525 ml    Exam  General: Alert and oriented x 3, NAD  HEENT:  PERRLA, EOMI, Anicteic Sclera, mucous membranes moist.   Neck: Supple, no JVD, no masses  CVS: S1 S2 auscultated, no rubs, murmurs or gallops. Regular rate and rhythm.  Respiratory: Clear to auscultation bilaterally, no wheezing, rales or rhonchi  Abdomen: Soft, nontender, nondistended, + bowel sounds  Ext: no cyanosis clubbing or edema  Neuro: AAOx3, Cr N's II- XII. Strength 5/5 upper and lower extremities bilaterally  Skin: No rashes  Psych: Normal affect and demeanor, alert and oriented x3    Data Review   Micro Results No results found for this or any previous visit (from the past 240 hour(s)).  Radiology Reports Ct Chest W Contrast  05/21/2014   CLINICAL DATA:  Cirrhosis, hepatitis C. Cerebellar mass. Lung mass on chest radiograph. Evaluate for metastatic disease.  EXAM: CT CHEST, ABDOMEN, AND PELVIS WITH CONTRAST  TECHNIQUE: Multidetector CT imaging of the chest, abdomen and pelvis was performed following the standard protocol during bolus administration of intravenous contrast.   CONTRAST:  169m OMNIPAQUE IOHEXOL 300 MG/ML  SOLN  COMPARISON:  Chest radiograph dated 05/20/2014.  FINDINGS: CT CHEST FINDINGS  Mediastinum/Nodes: The heart is normal in size. No pericardial effusion.  Coronary atherosclerosis in the LAD.  9 mm short axis right hilar node (series 3/ image 23). Small right paratracheal nodes measuring up to 6 mm short axis (series 3/image 20). 7 mm short axis subcarinal node (series 3/ image 23).  No suspicious axillary or supraclavicular nodes.  Visualized thyroid is unremarkable.  Lungs/Pleura: Macrolobulated 6.6 x 7.7 x 6.4 cm mass in the posterior right upper lobe (series 4/image 14), which abuts the posterior chest wall/pleural surface, but does not demonstrate definite overlying osseous invasion.  No evidence of satellite  nodularity or lymphangitic spread. Lungs are otherwise clear.  Mild dependent scarring/ atelectasis in the right lower lobe.  No pleural effusion or pneumothorax.  Musculoskeletal: Mild degenerative changes of the thoracic spine.  CT ABDOMEN PELVIS FINDINGS  Hepatobiliary: Cirrhosis. Mildly increased subcapsular perfusion inferiorly in thea posterior segment right hepatic lobe (series 3/image 70), which normalizes on the delayed phase, without definite underlying mass.  Cholelithiasis (series 3/ image 66). Gallbladder is underdistended. No intrahepatic or extrahepatic ductal dilatation.  Pancreas: Within normal limits.  Spleen: Splenomegaly, measuring 15.9 cm in maximal craniocaudal dimension.  Adrenals/Urinary Tract: Adrenal glands are within normal limits.  Kidneys are within normal limits.  No hydronephrosis.  Layering excretory contrast (likely gadolinium) within the bladder.  Stomach/Bowel: Stomach is within normal limits.  No evidence of bowel obstruction.  Normal appendix.  Colonic diverticulosis, without evidence of diverticulitis.  Vascular/Lymphatic: Atherosclerotic calcifications of the abdominal aorta and branch vessels.  Small upper abdominal  lymph nodes measuring up to 10 mm short axis, likely reactive.  Reproductive: Prostate is unremarkable.  Other: Moderate fat containing left inguinal/ scrotal hernia.  Musculoskeletal: Degenerative changes of the lumbar spine.  IMPRESSION: 7.7 cm posterior right upper lobe mass which abuts the posterior chest wall/pleural surface, compatible with primary bronchogenic neoplasm.  9 mm short axis right hilar node, worrisome for nodal metastasis. Additional small right paratracheal and subcarinal nodes, indeterminate.  No evidence of metastatic disease in the abdomen/pelvis.  Additional ancillary findings as above.   Electronically Signed   By: Julian Hy M.D.   On: 05/21/2014 13:21   Mr Jeri Cos NF Contrast  05/21/2014   CLINICAL DATA:  Dizziness with nausea and vomiting. Known brain mass.  EXAM: MRI HEAD WITHOUT AND WITH CONTRAST  TECHNIQUE: Multiplanar, multiecho pulse sequences of the brain and surrounding structures were obtained without and with intravenous contrast.  CONTRAST:  38m MULTIHANCE GADOBENATE DIMEGLUMINE 529 MG/ML IV SOLN  [facility]  FINDINGS: Calvarium and upper cervical spine: No focal marrow signal abnormality.  Orbits: No significant findings.  Sinuses: Retention cysts in the adenoid. No paranasal sinus or mastoid obstruction.  Brain: There is diffusely enhancing mass located in the lower left cerebellum with growth across the inferior cerebellar peduncle into the dorsal lateral medulla. The mass measures 32 x 25 x 37 mm. T2 hypo intensity and partial diffusion restriction suggests dense cellularity. There could be minimal blood products internally, but no measurable hematoma. No additional mass lesion is seen. Vasogenic edema surrounds the mass, with crowding in the posterior fossa and upper displacement of the cerebellum. The lower fourth ventricle it is partially effaced, but there is no hydrocephalus.  No infarct, major vessel  occlusion, for significant white matter disease.  IMPRESSION: 1. 32 x 25 x 37 mm densely cellular left cerebellar mass which extends into the left inferior peduncle and medulla. Given patient's right upper lobe mass, this is most consistent with a solitary metastasis. 2. Vasogenic edema partially effaces the lower fourth ventricle. No hydrocephalus.   Electronically Signed   By: JMonte FantasiaM.D.   On: 05/21/2014 12:04   Ct Abdomen Pelvis W Contrast  05/21/2014   CLINICAL DATA:  Cirrhosis, hepatitis C. Cerebellar mass. Lung mass on chest radiograph. Evaluate for metastatic disease.  EXAM: CT CHEST, ABDOMEN, AND PELVIS WITH CONTRAST  TECHNIQUE: Multidetector CT imaging of the chest, abdomen and pelvis was performed following the standard protocol during bolus administration of intravenous contrast.  CONTRAST:  1072m  OMNIPAQUE IOHEXOL 300 MG/ML  SOLN  COMPARISON:  Chest radiograph dated 05/20/2014.  FINDINGS: CT CHEST FINDINGS  Mediastinum/Nodes: The heart is normal in size. No pericardial effusion.  Coronary atherosclerosis in the LAD.  9 mm short axis right hilar node (series 3/ image 23). Small right paratracheal nodes measuring up to 6 mm short axis (series 3/image 20). 7 mm short axis subcarinal node (series 3/ image 23).  No suspicious axillary or supraclavicular nodes.  Visualized thyroid is unremarkable.  Lungs/Pleura: Macrolobulated 6.6 x 7.7 x 6.4 cm mass in the posterior right upper lobe (series 4/image 14), which abuts the posterior chest wall/pleural surface, but does not demonstrate definite overlying osseous invasion.  No evidence of satellite nodularity or lymphangitic spread. Lungs are otherwise clear.  Mild dependent scarring/ atelectasis in the right lower lobe.  No pleural effusion or pneumothorax.  Musculoskeletal: Mild degenerative changes of the thoracic spine.  CT ABDOMEN PELVIS FINDINGS  Hepatobiliary: Cirrhosis. Mildly increased subcapsular perfusion inferiorly in thea posterior segment  right hepatic lobe (series 3/image 70), which normalizes on the delayed phase, without definite underlying mass.  Cholelithiasis (series 3/ image 66). Gallbladder is underdistended. No intrahepatic or extrahepatic ductal dilatation.  Pancreas: Within normal limits.  Spleen: Splenomegaly, measuring 15.9 cm in maximal craniocaudal dimension.  Adrenals/Urinary Tract: Adrenal glands are within normal limits.  Kidneys are within normal limits.  No hydronephrosis.  Layering excretory contrast (likely gadolinium) within the bladder.  Stomach/Bowel: Stomach is within normal limits.  No evidence of bowel obstruction.  Normal appendix.  Colonic diverticulosis, without evidence of diverticulitis.  Vascular/Lymphatic: Atherosclerotic calcifications of the abdominal aorta and branch vessels.  Small upper abdominal lymph nodes measuring up to 10 mm short axis, likely reactive.  Reproductive: Prostate is unremarkable.  Other: Moderate fat containing left inguinal/ scrotal hernia.  Musculoskeletal: Degenerative changes of the lumbar spine.  IMPRESSION: 7.7 cm posterior right upper lobe mass which abuts the posterior chest wall/pleural surface, compatible with primary bronchogenic neoplasm.  9 mm short axis right hilar node, worrisome for nodal metastasis. Additional small right paratracheal and subcarinal nodes, indeterminate.  No evidence of metastatic disease in the abdomen/pelvis.  Additional ancillary findings as above.   Electronically Signed   By: Julian Hy M.D.   On: 05/21/2014 13:21   Ct Biopsy  05/23/2014   CLINICAL DATA:  Right upper lobe mass.  Intracranial lesion.  EXAM: CT-GUIDED BIOPSY OF A RIGHT UPPER LOBE MASS.  CORE.  MEDICATIONS AND MEDICAL HISTORY: Versed 2 mg, Fentanyl 100 mcg.  Additional Medications: None.  ANESTHESIA/SEDATION: Moderate sedation time: 10 minutes  PROCEDURE: The procedure, risks, benefits, and alternatives were explained to the patient. Questions regarding the procedure were  encouraged and answered. The patient understands and consents to the procedure.  The right upper posterior thorax was prepped with Betadine in a sterile fashion, and a sterile drape was applied covering the operative field. A sterile gown and sterile gloves were used for the procedure.  Under CT guidance, a(n) 17 gauge guide needle was advanced into the right upper lobe lung mass. Subsequently 3 18 gauge core biopsies were obtained. The guide needle was removed. Final imaging was performed.  Patient tolerated the procedure well without complication. Vital sign monitoring by nursing staff during the procedure will continue as patient is in the special procedures unit for post procedure observation.  FINDINGS: The images document guide needle placement within the right upper lobe lung mass. Post biopsy images demonstrate no pneumothorax.  COMPLICATIONS: None  IMPRESSION: Successful CT-guided  right upper lobe lung mass core biopsy.   Electronically Signed   By: Marybelle Killings M.D.   On: 05/23/2014 14:13   Dg Chest Port 1 View  05/23/2014   CLINICAL DATA:  Evaluate for pneumothorax after lung biopsy  EXAM: PORTABLE CHEST - 1 VIEW  COMPARISON:  None.  FINDINGS: The heart size and mediastinal contours are within normal limits. Right upper lobe lung mass is again noted. No pneumothorax identified. The visualized skeletal structures are unremarkable.  IMPRESSION: 1. No pneumothorax noted following lung biopsy.   Electronically Signed   By: Kerby Moors M.D.   On: 05/23/2014 14:47    CBC  Recent Labs Lab 05/21/14 0017 05/21/14 0409 05/22/14 0458 05/23/14 0431 05/24/14 0458  WBC 7.9 7.9 8.5 11.1* 11.6*  HGB 14.6 14.3 15.0 14.7 15.0  HCT 40.9 40.0 41.7 40.4 41.8  PLT 124* 123* 134* 137* 136*  MCV 89.3 88.5 88.5 88.2 89.1  MCH 31.9 31.6 31.8 32.1 32.0  MCHC 35.7 35.8 36.0 36.4* 35.9  RDW 12.9 12.9 12.6 12.6 12.9  LYMPHSABS  --  2.5 1.6 1.6 2.2  MONOABS  --  0.8 0.5 0.7 0.7  EOSABS  --  0.1 0.0 0.0 0.0   BASOSABS  --  0.0 0.0 0.0 0.0    Chemistries   Recent Labs Lab 05/21/14 0017 05/21/14 0409 05/22/14 0458 05/23/14 0431 05/24/14 0458  NA  --  134* 136 136 134*  K  --  3.8 4.8 4.3 4.6  CL  --  100 101 102 100  CO2  --  '25 25 25 27  '$ GLUCOSE  --  111* 138* 204* 175*  BUN  --  '12 11 12 16  '$ CREATININE 0.66 0.66 0.79 0.67 0.70  CALCIUM  --  8.7 9.4 9.2 9.3  AST  --  84* 67* 49* 56*  ALT  --  150* 139* 117* 125*  ALKPHOS  --  57 59 57 61  BILITOT  --  1.1 1.3* 0.5 0.8   ------------------------------------------------------------------------------------------------------------------ estimated creatinine clearance is 118.6 mL/min (by C-G formula based on Cr of 0.7). ------------------------------------------------------------------------------------------------------------------ No results for input(s): HGBA1C in the last 72 hours. ------------------------------------------------------------------------------------------------------------------ No results for input(s): CHOL, HDL, LDLCALC, TRIG, CHOLHDL, LDLDIRECT in the last 72 hours. ------------------------------------------------------------------------------------------------------------------ No results for input(s): TSH, T4TOTAL, T3FREE, THYROIDAB in the last 72 hours.  Invalid input(s): FREET3 ------------------------------------------------------------------------------------------------------------------ No results for input(s): VITAMINB12, FOLATE, FERRITIN, TIBC, IRON, RETICCTPCT in the last 72 hours.  Coagulation profile  Recent Labs Lab 05/22/14 1214  INR 0.97    No results for input(s): DDIMER in the last 72 hours.  Cardiac Enzymes No results for input(s): CKMB, TROPONINI, MYOGLOBIN in the last 168 hours.  Invalid input(s): CK ------------------------------------------------------------------------------------------------------------------ Invalid input(s): POCBNP  No results for input(s): GLUCAP in the  last 72 hours.   RAI,RIPUDEEP M.D. [facility] 05/24/2014, 12:07 PM  Pager: [phone]   Between 7am to 7pm - call Pager - [phone]  After 7pm go to [email or web address] - password [email or web address]  Call night coverage person covering after 7pm

## 2014-05-25 LAB — COMPREHENSIVE METABOLIC PANEL
ALBUMIN: 3.5 g/dL (ref 3.5–5.0)
ALT: 132 U/L — AB (ref 17–63)
ANION GAP: 11 (ref 5–15)
AST: 56 U/L — ABNORMAL HIGH (ref 15–41)
Alkaline Phosphatase: 60 U/L (ref 38–126)
BUN: 18 mg/dL (ref 6–20)
CHLORIDE: 96 mmol/L — AB (ref 101–111)
CO2: 26 mmol/L (ref 22–32)
CREATININE: 0.68 mg/dL (ref 0.61–1.24)
Calcium: 9.5 mg/dL (ref 8.9–10.3)
GLUCOSE: 268 mg/dL — AB (ref 70–99)
POTASSIUM: 3.8 mmol/L (ref 3.5–5.1)
Sodium: 133 mmol/L — ABNORMAL LOW (ref 135–145)
Total Bilirubin: 1.2 mg/dL (ref 0.3–1.2)
Total Protein: 7.4 g/dL (ref 6.5–8.1)

## 2014-05-25 LAB — CBC WITH DIFFERENTIAL/PLATELET
Basophils Absolute: 0 10*3/uL (ref 0.0–0.1)
Basophils Relative: 0 % (ref 0–1)
Eosinophils Absolute: 0 10*3/uL (ref 0.0–0.7)
Eosinophils Relative: 0 % (ref 0–5)
HEMATOCRIT: 43.7 % (ref 39.0–52.0)
HEMOGLOBIN: 15.9 g/dL (ref 13.0–17.0)
LYMPHS PCT: 18 % (ref 12–46)
Lymphs Abs: 2.2 10*3/uL (ref 0.7–4.0)
MCH: 32.3 pg (ref 26.0–34.0)
MCHC: 36.4 g/dL — AB (ref 30.0–36.0)
MCV: 88.6 fL (ref 78.0–100.0)
Monocytes Absolute: 0.4 10*3/uL (ref 0.1–1.0)
Monocytes Relative: 4 % (ref 3–12)
Neutro Abs: 9.9 10*3/uL — ABNORMAL HIGH (ref 1.7–7.7)
Neutrophils Relative %: 78 % — ABNORMAL HIGH (ref 43–77)
Platelets: 163 10*3/uL (ref 150–400)
RBC: 4.93 MIL/uL (ref 4.22–5.81)
RDW: 13 % (ref 11.5–15.5)
WBC: 12.6 10*3/uL — ABNORMAL HIGH (ref 4.0–10.5)

## 2014-05-25 NOTE — Progress Notes (Signed)
[facility]                                                                              Patient Demographics  Ray Garcia, is a 54 y.o. male, DOB - 1960/06/01, MBT:[id number]  Admit date - 05/20/2014   Admitting Physician Lavina Hamman, MD  Outpatient Primary MD for the patient is [name], [name], MD  LOS - 5   No chief complaint on file.      Brief HPI   Patient is a 54 year old male with cirrhosis, hepatitis C, hypertension, history of tobacco abuse presented as a transfer from [facility] where he initially presented with a two-week history of nausea, vomiting, dizziness, recurrent falls. A head CT performed at that facility showed abnormal appearance of the left cerebellar hemisphere consistent with mass lesion and associated edema. Patient was transferred to [facility] to undergo further workup.  He was further worked up with a CT scan of chest abdomen and pelvis that showed a 7.7 cm mass located in the right upper lobe lung. These findings were discussed with pulmonary critical care medicine who recommended after reviewing scans interventional radiology consultation for CT-guided biopsy. Per radiology findings seem consistent with primary bronchogenic neoplasm. Interventional radiology consulted.  Subjective:   Patient examined earlier this afternoon. He complained of some pain in the right upper chest which she has on and off for years. It is mild in nature, described as a dull ache and relieved with morphine.   Assessment & Plan    Principal problem Left cerebellar mass lesion with vasogenic edema - CT of the brain showed abnormal appearance of left cerebellar hemisphere consistent with mass lesion and vasogenic edema. CT scan of lungs showing a 7.7 cm posterior right upper lobe mass, making this cerebellar mass lesion suspicious for metastasis -Rad/Onc and neurosurgery consulted consulted.  -Continue IV steroids- per neurosurgery, Dr.  Kathyrn Sheriff, if non-small cell CA, may require resection of the cerebellar lesion.   Active problems  Probable metastatic lung cancer -Patient has a 30-pack-year smoking history, CT chest showed 7.7 cm posterior right upper lobe mass compatible with primary bronchogenic neoplasm. -Patient underwent CT-guided biopsy 4/29, results still pending. Further management per neurosurgery depends on the biopsy results, will need oncology consult after biopsy results are available  Nausea/vomiting. -As mentioned above likely secondary to cerebellar mass lesion seen on brain imaging at outside hospital significantly improved after IV steroids   liver cirrhosis: Likely due to hepatitis C and chronic alcoholism - CT abdomen and pelvis showed no evidence of metastatic disease in the abdomen and pelvis.   Chronic alcoholism. -Patient reported that his last drink about a month ago, currently does not exhibit signs or symptoms of alcohol withdrawal  Essential hypertension  - Currently stable  Constipation - Added MiraLAX, Colace and Dulcolax suppository  Code Status:Full code  Family Communication: Discussed in detail with the patient, all imaging results, lab results explained to the patient    Disposition Plan: Still awaiting biopsy results  Time Spent in minutes   25 minutes  Procedures  CT chest, abdomen and pelvis  Consults  Critical care medicine  IR Neurosurgery  DVT Prophylaxis heparin   Medications  Scheduled Meds: . clindamycin  300 mg Oral 3 times per day  . dexamethasone  4 mg Intravenous 3 times per day  . docusate sodium  100 mg Oral BID  . feeding supplement (ENSURE ENLIVE)  237 mL Oral BID BM  . folic acid  1 mg Oral Daily  . heparin  5,000 Units Subcutaneous 3 times per day  . multivitamin with minerals  1 tablet Oral Daily  . polyethylene glycol  17 g Oral Daily  . saccharomyces boulardii  250 mg Oral BID  . thiamine  100 mg Oral Daily   Continuous Infusions:     PRN Meds:.morphine injection, ondansetron **OR** ondansetron (ZOFRAN) IV   Antibiotics   Anti-infectives    Start     Dose/Rate Route Frequency Ordered Stop   05/22/14 0015  clindamycin (CLEOCIN) capsule 300 mg     300 mg Oral 3 times per day 05/21/14 2359     05/21/14 0645  penicillin v potassium (VEETID) tablet 500 mg  Status:  Discontinued     500 mg Oral 4 times per day 05/21/14 0642 05/21/14 2358      Objective:   Blood pressure 143/90, pulse 60, temperature 97.5 F (36.4 C), temperature source Oral, resp. rate 20, height 5' 9.6" (1.768 m), weight 92.8 kg (204 lb 9.4 oz), SpO2 92 %.  Wt Readings from Last 3 Encounters:  05/20/14 92.8 kg (204 lb 9.4 oz)     Intake/Output Summary (Last 24 hours) at 05/25/14 1537 Last data filed at 05/25/14 1422  Gross per 24 hour  Intake    720 ml  Output   3175 ml  Net  -2455 ml    Exam  General: Alert and oriented x 3, NAD  HEENT:  PERRLA, EOMI, Anicteic Sclera, mucous membranes moist.   Neck: Supple, no JVD, no masses  CVS: S1 S2 auscultated, no rubs, murmurs or gallops. Regular rate and rhythm.  Respiratory: Clear to auscultation bilaterally, no wheezing, rales or rhonchi  Abdomen: Soft, nontender, nondistended, + bowel sounds  Ext: no cyanosis clubbing or edema  Neuro: AAOx3, Cr N's II- XII. Strength 5/5 upper and lower extremities bilaterally  Skin: No rashes  Psych: Normal affect and demeanor, alert and oriented x3    Data Review   Micro Results No results found for this or any previous visit (from the past 240 hour(s)).  Radiology Reports Ct Chest W Contrast  05/21/2014   CLINICAL DATA:  Cirrhosis, hepatitis C. Cerebellar mass. Lung mass on chest radiograph. Evaluate for metastatic disease.  EXAM: CT CHEST, ABDOMEN, AND PELVIS WITH CONTRAST  TECHNIQUE: Multidetector CT imaging of the chest, abdomen and pelvis was performed following the standard protocol during bolus administration of intravenous contrast.   CONTRAST:  171m OMNIPAQUE IOHEXOL 300 MG/ML  SOLN  COMPARISON:  Chest radiograph dated 05/20/2014.  FINDINGS: CT CHEST FINDINGS  Mediastinum/Nodes: The heart is normal in size. No pericardial effusion.  Coronary atherosclerosis in the LAD.  9 mm short axis right hilar node (series 3/ image 23). Small right paratracheal nodes measuring up to 6 mm short axis (series 3/image 20). 7 mm short axis subcarinal node (series 3/ image 23).  No suspicious axillary or supraclavicular nodes.  Visualized thyroid is unremarkable.  Lungs/Pleura: Macrolobulated 6.6 x 7.7 x 6.4 cm mass in the posterior right upper lobe (series 4/image 14), which abuts the posterior chest wall/pleural surface, but does not demonstrate definite overlying osseous invasion.  No evidence of satellite nodularity or lymphangitic spread. Lungs are otherwise clear.  Mild dependent scarring/ atelectasis in the right lower lobe.  No pleural effusion or pneumothorax.  Musculoskeletal: Mild degenerative changes of the thoracic spine.  CT ABDOMEN PELVIS FINDINGS  Hepatobiliary: Cirrhosis. Mildly increased subcapsular perfusion inferiorly in thea posterior segment right hepatic lobe (series 3/image 70), which normalizes on the delayed phase, without definite underlying mass.  Cholelithiasis (series 3/ image 66). Gallbladder is underdistended. No intrahepatic or extrahepatic ductal dilatation.  Pancreas: Within normal limits.  Spleen: Splenomegaly, measuring 15.9 cm in maximal craniocaudal dimension.  Adrenals/Urinary Tract: Adrenal glands are within normal limits.  Kidneys are within normal limits.  No hydronephrosis.  Layering excretory contrast (likely gadolinium) within the bladder.  Stomach/Bowel: Stomach is within normal limits.  No evidence of bowel obstruction.  Normal appendix.  Colonic diverticulosis, without evidence of diverticulitis.  Vascular/Lymphatic: Atherosclerotic calcifications of the abdominal aorta and branch vessels.  Small upper abdominal  lymph nodes measuring up to 10 mm short axis, likely reactive.  Reproductive: Prostate is unremarkable.  Other: Moderate fat containing left inguinal/ scrotal hernia.  Musculoskeletal: Degenerative changes of the lumbar spine.  IMPRESSION: 7.7 cm posterior right upper lobe mass which abuts the posterior chest wall/pleural surface, compatible with primary bronchogenic neoplasm.  9 mm short axis right hilar node, worrisome for nodal metastasis. Additional small right paratracheal and subcarinal nodes, indeterminate.  No evidence of metastatic disease in the abdomen/pelvis.  Additional ancillary findings as above.   Electronically Signed   By: Julian Hy M.Garcia.   On: 05/21/2014 13:21   Mr Jeri Cos VE Contrast  05/21/2014   CLINICAL DATA:  Dizziness with nausea and vomiting. Known brain mass.  EXAM: MRI HEAD WITHOUT AND WITH CONTRAST  TECHNIQUE: Multiplanar, multiecho pulse sequences of the brain and surrounding structures were obtained without and with intravenous contrast.  CONTRAST:  38m MULTIHANCE GADOBENATE DIMEGLUMINE 529 MG/ML IV SOLN  [facility]  FINDINGS: Calvarium and upper cervical spine: No focal marrow signal abnormality.  Orbits: No significant findings.  Sinuses: Retention cysts in the adenoid. No paranasal sinus or mastoid obstruction.  Brain: There is diffusely enhancing mass located in the lower left cerebellum with growth across the inferior cerebellar peduncle into the dorsal lateral medulla. The mass measures 32 x 25 x 37 mm. T2 hypo intensity and partial diffusion restriction suggests dense cellularity. There could be minimal blood products internally, but no measurable hematoma. No additional mass lesion is seen. Vasogenic edema surrounds the mass, with crowding in the posterior fossa and upper displacement of the cerebellum. The lower fourth ventricle it is partially effaced, but there is no hydrocephalus.  No infarct, major vessel  occlusion, for significant white matter disease.  IMPRESSION: 1. 32 x 25 x 37 mm densely cellular left cerebellar mass which extends into the left inferior peduncle and medulla. Given patient's right upper lobe mass, this is most consistent with a solitary metastasis. 2. Vasogenic edema partially effaces the lower fourth ventricle. No hydrocephalus.   Electronically Signed   By: JMonte FantasiaM.Garcia.   On: 05/21/2014 12:04   Ct Abdomen Pelvis W Contrast  05/21/2014   CLINICAL DATA:  Cirrhosis, hepatitis C. Cerebellar mass. Lung mass on chest radiograph. Evaluate for metastatic disease.  EXAM: CT CHEST, ABDOMEN, AND PELVIS WITH CONTRAST  TECHNIQUE: Multidetector CT imaging of the chest, abdomen and pelvis was performed following the standard protocol during bolus administration of intravenous contrast.  CONTRAST:  161m OMNIPAQUE IOHEXOL 300 MG/ML  SOLN  COMPARISON:  Chest radiograph dated 05/20/2014.  FINDINGS: CT CHEST FINDINGS  Mediastinum/Nodes: The heart is normal in size. No pericardial effusion.  Coronary atherosclerosis in the LAD.  9 mm short axis right hilar node (series 3/ image 23). Small right paratracheal nodes measuring up to 6 mm short axis (series 3/image 20). 7 mm short axis subcarinal node (series 3/ image 23).  No suspicious axillary or supraclavicular nodes.  Visualized thyroid is unremarkable.  Lungs/Pleura: Macrolobulated 6.6 x 7.7 x 6.4 cm mass in the posterior right upper lobe (series 4/image 14), which abuts the posterior chest wall/pleural surface, but does not demonstrate definite overlying osseous invasion.  No evidence of satellite nodularity or lymphangitic spread. Lungs are otherwise clear.  Mild dependent scarring/ atelectasis in the right lower lobe.  No pleural effusion or pneumothorax.  Musculoskeletal: Mild degenerative changes of the thoracic spine.  CT ABDOMEN PELVIS FINDINGS  Hepatobiliary: Cirrhosis. Mildly increased subcapsular perfusion inferiorly in thea posterior segment  right hepatic lobe (series 3/image 70), which normalizes on the delayed phase, without definite underlying mass.  Cholelithiasis (series 3/ image 66). Gallbladder is underdistended. No intrahepatic or extrahepatic ductal dilatation.  Pancreas: Within normal limits.  Spleen: Splenomegaly, measuring 15.9 cm in maximal craniocaudal dimension.  Adrenals/Urinary Tract: Adrenal glands are within normal limits.  Kidneys are within normal limits.  No hydronephrosis.  Layering excretory contrast (likely gadolinium) within the bladder.  Stomach/Bowel: Stomach is within normal limits.  No evidence of bowel obstruction.  Normal appendix.  Colonic diverticulosis, without evidence of diverticulitis.  Vascular/Lymphatic: Atherosclerotic calcifications of the abdominal aorta and branch vessels.  Small upper abdominal lymph nodes measuring up to 10 mm short axis, likely reactive.  Reproductive: Prostate is unremarkable.  Other: Moderate fat containing left inguinal/ scrotal hernia.  Musculoskeletal: Degenerative changes of the lumbar spine.  IMPRESSION: 7.7 cm posterior right upper lobe mass which abuts the posterior chest wall/pleural surface, compatible with primary bronchogenic neoplasm.  9 mm short axis right hilar node, worrisome for nodal metastasis. Additional small right paratracheal and subcarinal nodes, indeterminate.  No evidence of metastatic disease in the abdomen/pelvis.  Additional ancillary findings as above.   Electronically Signed   By: SJulian HyM.Garcia.   On: 05/21/2014 13:21   Ct Biopsy  05/23/2014   CLINICAL DATA:  Right upper lobe mass.  Intracranial lesion.  EXAM: CT-GUIDED BIOPSY OF A RIGHT UPPER LOBE MASS.  CORE.  MEDICATIONS AND MEDICAL HISTORY: Versed 2 mg, Fentanyl 100 mcg.  Additional Medications: None.  ANESTHESIA/SEDATION: Moderate sedation time: 10 minutes  PROCEDURE: The procedure, risks, benefits, and alternatives were explained to the patient. Questions regarding the procedure were  encouraged and answered. The patient understands and consents to the procedure.  The right upper posterior thorax was prepped with Betadine in a sterile fashion, and a sterile drape was applied covering the operative field. A sterile gown and sterile gloves were used for the procedure.  Under CT guidance, a(n) 17 gauge guide needle was advanced into the right upper lobe lung mass. Subsequently 3 18 gauge core biopsies were obtained. The guide needle was removed. Final imaging was performed.  Patient tolerated the procedure well without complication. Vital sign monitoring by nursing staff during the procedure will continue as patient is in the special procedures unit for post procedure observation.  FINDINGS: The images document guide needle placement within the right upper lobe lung mass. Post biopsy images demonstrate no pneumothorax.  COMPLICATIONS: None  IMPRESSION: Successful CT-guided right upper lobe lung mass core biopsy.   Electronically Signed   By: Marybelle Killings M.Garcia.   On: 05/23/2014 14:13   Dg Chest Port 1 View  05/23/2014   CLINICAL DATA:  Evaluate for pneumothorax after lung biopsy  EXAM: PORTABLE CHEST - 1 VIEW  COMPARISON:  None.  FINDINGS: The heart size and mediastinal contours are within normal limits. Right upper lobe lung mass is again noted. No pneumothorax identified. The visualized skeletal structures are unremarkable.  IMPRESSION: 1. No pneumothorax noted following lung biopsy.   Electronically Signed   By: Kerby Moors M.Garcia.   On: 05/23/2014 14:47    CBC  Recent Labs Lab 05/21/14 0409 05/22/14 0458 05/23/14 0431 05/24/14 0458 05/25/14 0805  WBC 7.9 8.5 11.1* 11.6* 12.6*  HGB 14.3 15.0 14.7 15.0 15.9  HCT 40.0 41.7 40.4 41.8 43.7  PLT 123* 134* 137* 136* 163  MCV 88.5 88.5 88.2 89.1 88.6  MCH 31.6 31.8 32.1 32.0 32.3  MCHC 35.8 36.0 36.4* 35.9 36.4*  RDW 12.9 12.6 12.6 12.9 13.0  LYMPHSABS 2.5 1.6 1.6 2.2 2.2  MONOABS 0.8 0.5 0.7 0.7 0.4  EOSABS 0.1 0.0 0.0 0.0 0.0    BASOSABS 0.0 0.0 0.0 0.0 0.0    Chemistries   Recent Labs Lab 05/21/14 0409 05/22/14 0458 05/23/14 0431 05/24/14 0458 05/25/14 0805  NA 134* 136 136 134* 133*  K 3.8 4.8 4.3 4.6 3.8  CL 100 101 102 100 96*  CO2 '25 25 25 27 26  '$ GLUCOSE 111* 138* 204* 175* 268*  BUN '12 11 12 16 18  '$ CREATININE 0.66 0.79 0.67 0.70 0.68  CALCIUM 8.7 9.4 9.2 9.3 9.5  AST 84* 67* 49* 56* 56*  ALT 150* 139* 117* 125* 132*  ALKPHOS 57 59 57 61 60  BILITOT 1.1 1.3* 0.5 0.8 1.2   ------------------------------------------------------------------------------------------------------------------ estimated creatinine clearance is 118.6 mL/min (by C-G formula based on Cr of 0.68). ------------------------------------------------------------------------------------------------------------------ No results for input(s): HGBA1C in the last 72 hours. ------------------------------------------------------------------------------------------------------------------ No results for input(s): CHOL, HDL, LDLCALC, TRIG, CHOLHDL, LDLDIRECT in the last 72 hours. ------------------------------------------------------------------------------------------------------------------ No results for input(s): TSH, T4TOTAL, T3FREE, THYROIDAB in the last 72 hours.  Invalid input(s): FREET3 ------------------------------------------------------------------------------------------------------------------ No results for input(s): VITAMINB12, FOLATE, FERRITIN, TIBC, IRON, RETICCTPCT in the last 72 hours.  Coagulation profile  Recent Labs Lab 05/22/14 1214  INR 0.97    No results for input(s): DDIMER in the last 72 hours.  Cardiac Enzymes No results for input(s): CKMB, TROPONINI, MYOGLOBIN in the last 168 hours.  Invalid input(s): CK ------------------------------------------------------------------------------------------------------------------ Invalid input(s): POCBNP  No results for input(s): GLUCAP in the last 72  hours.   [facility] M.Garcia. [facility] 05/25/2014, 3:37 PM  Pager: [phone] After 7pm go to [email or web address] - password [email or web address]  Call night coverage person covering after 7pm

## 2014-05-25 NOTE — Progress Notes (Signed)
Patient ID: Ray Garcia, male   DOB: 1961-01-14, 54 y.o.   MRN: [id number] BP 141/95 mmHg  Pulse 58  Temp(Src) 97.6 F (36.4 C) (Oral)  Resp 16  Ht 5' 9.6" (1.768 m)  Wt 92.8 kg (204 lb 9.4 oz)  BMI 29.69 kg/m2  SpO2 96% Alert and oriented x 4, speech is clear, and fluent Voice hoarse No drift Perrl, full eom Symmetric facies, tongue and uvula midline 5/5 strength all extremities No nystagmus, normal finger nose finger testing Await biopsy results

## 2014-05-26 ENCOUNTER — Ambulatory Visit
Admit: 2014-05-26 | Discharge: 2014-05-26 | Disposition: A | Discharge status: Home / Self Care | Payer: MEDICAID | Attending: Radiation Oncology | Admitting: Radiation Oncology

## 2014-05-26 DIAGNOSIS — Z9889 Other specified postprocedural states: Secondary | ICD-10-CM | POA: Insufficient documentation

## 2014-05-26 DIAGNOSIS — K047 Periapical abscess without sinus: Secondary | ICD-10-CM | POA: Insufficient documentation

## 2014-05-26 DIAGNOSIS — G9389 Other specified disorders of brain: Secondary | ICD-10-CM

## 2014-05-26 LAB — CBC WITH DIFFERENTIAL/PLATELET
BASOS ABS: 0 10*3/uL (ref 0.0–0.1)
Basophils Relative: 0 % (ref 0–1)
EOS ABS: 0 10*3/uL (ref 0.0–0.7)
Eosinophils Relative: 0 % (ref 0–5)
HCT: 45.5 % (ref 39.0–52.0)
Hemoglobin: 16.4 g/dL (ref 13.0–17.0)
LYMPHS PCT: 18 % (ref 12–46)
Lymphs Abs: 2.5 10*3/uL (ref 0.7–4.0)
MCH: 32 pg (ref 26.0–34.0)
MCHC: 36 g/dL (ref 30.0–36.0)
MCV: 88.7 fL (ref 78.0–100.0)
MONO ABS: 0.9 10*3/uL (ref 0.1–1.0)
Monocytes Relative: 7 % (ref 3–12)
NEUTROS ABS: 9.9 10*3/uL — AB (ref 1.7–7.7)
NEUTROS PCT: 75 % (ref 43–77)
Platelets: 154 10*3/uL (ref 150–400)
RBC: 5.13 MIL/uL (ref 4.22–5.81)
RDW: 12.9 % (ref 11.5–15.5)
WBC: 13.3 10*3/uL — ABNORMAL HIGH (ref 4.0–10.5)

## 2014-05-26 NOTE — Progress Notes (Signed)
[facility]                                                                              Patient Demographics  Ray Garcia, is a 54 y.o. male, DOB - 02-03-1960, LOV:[id number]  Admit date - 05/20/2014   Admitting Physician Lavina Hamman, MD  Outpatient Primary MD for the patient is [name], [name], MD  LOS - 6   No chief complaint on file.     HPI on 05/20/2014 by Dr. Shanda Howells This is a 54 y.o. year old male with significant past medical history of cirrhosis, hepatitis C, HTN, dental caries/abscess, ETOH abuse presenting with cerebellar mass. Patient presented today at [facility] with chief complaint of recurrent intermittent headaches as well as recurrent nausea and dizziness. States that symptoms have been fairly persistent for over the past 2 weeks. No reported head trauma. Reports only minimal alcohol intake. One to 2 beers per day. Previous heavy drinker. No hemiparesis or confusion. Also with noted dental caries and dental abscess. Is currently on oral penicillin for this. Presents his [facility] afebrile, hemodynamically stable. A head CT obtained that [facility] showed abnormal appearance of the left cerebellar hemisphere concerning for underlying mass lesion with associated edema. Per report, ER physician assistant discussed case with on-call neurosurgery at [facility] recommended medical admission with formal neurosurgical consultation. Labs are pending.   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 CT scan of lungs showing a 7.7 cm posterior right upper lobe mass, making this cerebellar mass lesion suspicious for metastasis -Rad/Onc and neurosurgery consulted consulted.  -Continue IV steroids- per neurosurgery, Dr. Kathyrn Sheriff, if non-small cell CA, may require resection of the cerebellar lesion (note on  05/22/2014)  Probable metastatic lung cancer -Patient has a 30-pack-year smoking history, CT chest showed 7.7 cm posterior right upper lobe mass compatible with primary bronchogenic neoplasm. -Patient underwent CT-guided biopsy 4/29, results still pending. Further management per neurosurgery depends on the biopsy results, will need oncology consult after biopsy results are available  Nausea/vomiting. -As mentioned above likely secondary to cerebellar mass lesion seen on brain imaging at outside hospital significantly improved after IV steroids  Liver cirrhosis: Likely due to hepatitis C and chronic alcoholism -CT abdomen and pelvis showed no evidence of metastatic disease in the abdomen and pelvis.   Chronic alcoholism. -Patient reported that his last drink about a month ago, currently does not exhibit signs or symptoms of alcohol withdrawal  Essential hypertension  -Currently stable  Constipation -Continue MiraLAX, Colace and Dulcolax suppository -Patient states he had a bowel movement, but is used to have 2 per day  Leukocytosis -Likely secondary to decadron -Continue to monitor  Dental caries/abscess -Was on PCN as an outpatient -Currently on clindamycin  Code Status: Full  Family Communication: None at bedside.  Disposition Plan: Admitted, pending biopsy results  Time Spent in minutes   30 minutes  Procedures  CT guided biopsy- by IR  Consults   Pulmonology Interventional radiology Neurosurgery  DVT Prophylaxis  Heparin  Lab Results  Component Value Date  PLT 154 05/26/2014    Medications  Scheduled Meds: . clindamycin  300 mg Oral 3 times per day  . dexamethasone  4 mg Intravenous 3 times per day  . docusate sodium  100 mg Oral BID  . feeding supplement (ENSURE ENLIVE)  237 mL Oral BID BM  . folic acid  1 mg Oral Daily  . heparin  5,000 Units Subcutaneous 3 times per day  . multivitamin with minerals  1 tablet Oral Daily  . polyethylene glycol  17 g  Oral Daily  . saccharomyces boulardii  250 mg Oral BID  . thiamine  100 mg Oral Daily   Continuous Infusions:  PRN Meds:.morphine injection, ondansetron **OR** ondansetron (ZOFRAN) IV  Antibiotics    Anti-infectives    Start     Dose/Rate Route Frequency Ordered Stop   05/22/14 0015  clindamycin (CLEOCIN) capsule 300 mg     300 mg Oral 3 times per day 05/21/14 2359     05/21/14 0645  penicillin v potassium (VEETID) tablet 500 mg  Status:  Discontinued     500 mg Oral 4 times per day 05/21/14 4970 05/21/14 2358      Subjective:   Ray Garcia seen and examined today.  Patient states he is feeling better as compared to previous days.  He denies headache, chest pain, shortness of breath, abdominal pain.  Has    Objective:   Filed Vitals:   05/25/14 2033 05/26/14 0123 05/26/14 0533 05/26/14 1031  BP: 155/89 130/80 136/81 146/93  Pulse: 52 50 60 58  Temp: 98.1 F (36.7 C) 98.4 F (36.9 C) 98.2 F (36.8 C) 97.6 F (36.4 C)  TempSrc: Oral Oral Oral Oral  Resp: '18 18 18 20  '$ Height:      Weight:      SpO2: 97% 97% 97% 99%    Wt Readings from Last 3 Encounters:  05/20/14 92.8 kg (204 lb 9.4 oz)     Intake/Output Summary (Last 24 hours) at 05/26/14 1404 Last data filed at 05/26/14 1041  Gross per 24 hour  Intake    240 ml  Output   2225 ml  Net  -1985 ml    Exam  General: Well developed, well nourished, NAD, appears stated age  HEENT: NCAT, mucous membranes moist.   Cardiovascular: S1 S2 auscultated, no rubs, murmurs or gallops. Regular rate and rhythm.  Respiratory: Clear to auscultation bilaterally with equal chest rise  Abdomen: Soft, nontender, nondistended, + bowel sounds  Extremities: warm dry without cyanosis clubbing or edema  Neuro: AAOx3, nonfocal  Psych: Normal affect and demeanor  Data Review   Micro Results No results found for this or any previous visit (from the past 240 hour(s)).  Radiology Reports Ct Chest W Contrast  05/21/2014    CLINICAL DATA:  Cirrhosis, hepatitis C. Cerebellar mass. Lung mass on chest radiograph. Evaluate for metastatic disease.  EXAM: CT CHEST, ABDOMEN, AND PELVIS WITH CONTRAST  TECHNIQUE: Multidetector CT imaging of the chest, abdomen and pelvis was performed following the standard protocol during bolus administration of intravenous contrast.  CONTRAST:  144m OMNIPAQUE IOHEXOL 300 MG/ML  SOLN  COMPARISON:  Chest radiograph dated 05/20/2014.  FINDINGS: CT CHEST FINDINGS  Mediastinum/Nodes: The heart is normal in size. No pericardial effusion.  Coronary atherosclerosis in the LAD.  9 mm short axis right hilar node (series 3/ image 23). Small right paratracheal nodes measuring up to 6 mm short axis (series 3/image 20). 7 mm short axis subcarinal node (series 3/ image  23).  No suspicious axillary or supraclavicular nodes.  Visualized thyroid is unremarkable.  Lungs/Pleura: Macrolobulated 6.6 x 7.7 x 6.4 cm mass in the posterior right upper lobe (series 4/image 14), which abuts the posterior chest wall/pleural surface, but does not demonstrate definite overlying osseous invasion.  No evidence of satellite nodularity or lymphangitic spread. Lungs are otherwise clear.  Mild dependent scarring/ atelectasis in the right lower lobe.  No pleural effusion or pneumothorax.  Musculoskeletal: Mild degenerative changes of the thoracic spine.  CT ABDOMEN PELVIS FINDINGS  Hepatobiliary: Cirrhosis. Mildly increased subcapsular perfusion inferiorly in thea posterior segment right hepatic lobe (series 3/image 70), which normalizes on the delayed phase, without definite underlying mass.  Cholelithiasis (series 3/ image 66). Gallbladder is underdistended. No intrahepatic or extrahepatic ductal dilatation.  Pancreas: Within normal limits.  Spleen: Splenomegaly, measuring 15.9 cm in maximal craniocaudal dimension.  Adrenals/Urinary Tract: Adrenal glands are within normal limits.  Kidneys are within normal limits.  No hydronephrosis.  Layering  excretory contrast (likely gadolinium) within the bladder.  Stomach/Bowel: Stomach is within normal limits.  No evidence of bowel obstruction.  Normal appendix.  Colonic diverticulosis, without evidence of diverticulitis.  Vascular/Lymphatic: Atherosclerotic calcifications of the abdominal aorta and branch vessels.  Small upper abdominal lymph nodes measuring up to 10 mm short axis, likely reactive.  Reproductive: Prostate is unremarkable.  Other: Moderate fat containing left inguinal/ scrotal hernia.  Musculoskeletal: Degenerative changes of the lumbar spine.  IMPRESSION: 7.7 cm posterior right upper lobe mass which abuts the posterior chest wall/pleural surface, compatible with primary bronchogenic neoplasm.  9 mm short axis right hilar node, worrisome for nodal metastasis. Additional small right paratracheal and subcarinal nodes, indeterminate.  No evidence of metastatic disease in the abdomen/pelvis.  Additional ancillary findings as above.   Electronically Signed   By: Julian Hy M.D.   On: 05/21/2014 13:21   Mr Jeri Cos TG Contrast  05/21/2014   CLINICAL DATA:  Dizziness with nausea and vomiting. Known brain mass.  EXAM: MRI HEAD WITHOUT AND WITH CONTRAST  TECHNIQUE: Multiplanar, multiecho pulse sequences of the brain and surrounding structures were obtained without and with intravenous contrast.  CONTRAST:  75m MULTIHANCE GADOBENATE DIMEGLUMINE 529 MG/ML IV SOLN  [facility]  FINDINGS: Calvarium and upper cervical spine: No focal marrow signal abnormality.  Orbits: No significant findings.  Sinuses: Retention cysts in the adenoid. No paranasal sinus or mastoid obstruction.  Brain: There is diffusely enhancing mass located in the lower left cerebellum with growth across the inferior cerebellar peduncle into the dorsal lateral medulla. The mass measures 32 x 25 x 37 mm. T2 hypo intensity and partial diffusion restriction suggests dense  cellularity. There could be minimal blood products internally, but no measurable hematoma. No additional mass lesion is seen. Vasogenic edema surrounds the mass, with crowding in the posterior fossa and upper displacement of the cerebellum. The lower fourth ventricle it is partially effaced, but there is no hydrocephalus.  No infarct, major vessel occlusion, for significant white matter disease.  IMPRESSION: 1. 32 x 25 x 37 mm densely cellular left cerebellar mass which extends into the left inferior peduncle and medulla. Given patient's right upper lobe mass, this is most consistent with a solitary metastasis. 2. Vasogenic edema partially effaces the lower fourth ventricle. No hydrocephalus.   Electronically Signed   By: JMonte FantasiaM.D.   On: 05/21/2014 12:04   Ct Abdomen Pelvis W Contrast  05/21/2014   CLINICAL  DATA:  Cirrhosis, hepatitis C. Cerebellar mass. Lung mass on chest radiograph. Evaluate for metastatic disease.  EXAM: CT CHEST, ABDOMEN, AND PELVIS WITH CONTRAST  TECHNIQUE: Multidetector CT imaging of the chest, abdomen and pelvis was performed following the standard protocol during bolus administration of intravenous contrast.  CONTRAST:  153m OMNIPAQUE IOHEXOL 300 MG/ML  SOLN  COMPARISON:  Chest radiograph dated 05/20/2014.  FINDINGS: CT CHEST FINDINGS  Mediastinum/Nodes: The heart is normal in size. No pericardial effusion.  Coronary atherosclerosis in the LAD.  9 mm short axis right hilar node (series 3/ image 23). Small right paratracheal nodes measuring up to 6 mm short axis (series 3/image 20). 7 mm short axis subcarinal node (series 3/ image 23).  No suspicious axillary or supraclavicular nodes.  Visualized thyroid is unremarkable.  Lungs/Pleura: Macrolobulated 6.6 x 7.7 x 6.4 cm mass in the posterior right upper lobe (series 4/image 14), which abuts the posterior chest wall/pleural surface, but does not demonstrate definite overlying osseous invasion.  No evidence of satellite nodularity  or lymphangitic spread. Lungs are otherwise clear.  Mild dependent scarring/ atelectasis in the right lower lobe.  No pleural effusion or pneumothorax.  Musculoskeletal: Mild degenerative changes of the thoracic spine.  CT ABDOMEN PELVIS FINDINGS  Hepatobiliary: Cirrhosis. Mildly increased subcapsular perfusion inferiorly in thea posterior segment right hepatic lobe (series 3/image 70), which normalizes on the delayed phase, without definite underlying mass.  Cholelithiasis (series 3/ image 66). Gallbladder is underdistended. No intrahepatic or extrahepatic ductal dilatation.  Pancreas: Within normal limits.  Spleen: Splenomegaly, measuring 15.9 cm in maximal craniocaudal dimension.  Adrenals/Urinary Tract: Adrenal glands are within normal limits.  Kidneys are within normal limits.  No hydronephrosis.  Layering excretory contrast (likely gadolinium) within the bladder.  Stomach/Bowel: Stomach is within normal limits.  No evidence of bowel obstruction.  Normal appendix.  Colonic diverticulosis, without evidence of diverticulitis.  Vascular/Lymphatic: Atherosclerotic calcifications of the abdominal aorta and branch vessels.  Small upper abdominal lymph nodes measuring up to 10 mm short axis, likely reactive.  Reproductive: Prostate is unremarkable.  Other: Moderate fat containing left inguinal/ scrotal hernia.  Musculoskeletal: Degenerative changes of the lumbar spine.  IMPRESSION: 7.7 cm posterior right upper lobe mass which abuts the posterior chest wall/pleural surface, compatible with primary bronchogenic neoplasm.  9 mm short axis right hilar node, worrisome for nodal metastasis. Additional small right paratracheal and subcarinal nodes, indeterminate.  No evidence of metastatic disease in the abdomen/pelvis.  Additional ancillary findings as above.   Electronically Signed   By: SJulian HyM.D.   On: 05/21/2014 13:21   Ct Biopsy  05/23/2014   CLINICAL DATA:  Right upper lobe mass.  Intracranial lesion.   EXAM: CT-GUIDED BIOPSY OF A RIGHT UPPER LOBE MASS.  CORE.  MEDICATIONS AND MEDICAL HISTORY: Versed 2 mg, Fentanyl 100 mcg.  Additional Medications: None.  ANESTHESIA/SEDATION: Moderate sedation time: 10 minutes  PROCEDURE: The procedure, risks, benefits, and alternatives were explained to the patient. Questions regarding the procedure were encouraged and answered. The patient understands and consents to the procedure.  The right upper posterior thorax was prepped with Betadine in a sterile fashion, and a sterile drape was applied covering the operative field. A sterile gown and sterile gloves were used for the procedure.  Under CT guidance, a(n) 17 gauge guide needle was advanced into the right upper lobe lung mass. Subsequently 3 18 gauge core biopsies were obtained. The guide needle was removed. Final imaging was performed.  Patient tolerated the procedure well without  complication. Vital sign monitoring by nursing staff during the procedure will continue as patient is in the special procedures unit for post procedure observation.  FINDINGS: The images document guide needle placement within the right upper lobe lung mass. Post biopsy images demonstrate no pneumothorax.  COMPLICATIONS: None  IMPRESSION: Successful CT-guided right upper lobe lung mass core biopsy.   Electronically Signed   By: Marybelle Killings M.D.   On: 05/23/2014 14:13   Dg Chest Port 1 View  05/23/2014   CLINICAL DATA:  Evaluate for pneumothorax after lung biopsy  EXAM: PORTABLE CHEST - 1 VIEW  COMPARISON:  None.  FINDINGS: The heart size and mediastinal contours are within normal limits. Right upper lobe lung mass is again noted. No pneumothorax identified. The visualized skeletal structures are unremarkable.  IMPRESSION: 1. No pneumothorax noted following lung biopsy.   Electronically Signed   By: Kerby Moors M.D.   On: 05/23/2014 14:47    CBC  Recent Labs Lab 05/22/14 0458 05/23/14 0431 05/24/14 0458 05/25/14 0805 05/26/14 0710    WBC 8.5 11.1* 11.6* 12.6* 13.3*  HGB 15.0 14.7 15.0 15.9 16.4  HCT 41.7 40.4 41.8 43.7 45.5  PLT 134* 137* 136* 163 154  MCV 88.5 88.2 89.1 88.6 88.7  MCH 31.8 32.1 32.0 32.3 32.0  MCHC 36.0 36.4* 35.9 36.4* 36.0  RDW 12.6 12.6 12.9 13.0 12.9  LYMPHSABS 1.6 1.6 2.2 2.2 2.5  MONOABS 0.5 0.7 0.7 0.4 0.9  EOSABS 0.0 0.0 0.0 0.0 0.0  BASOSABS 0.0 0.0 0.0 0.0 0.0    Chemistries   Recent Labs Lab 05/21/14 0409 05/22/14 0458 05/23/14 0431 05/24/14 0458 05/25/14 0805  NA 134* 136 136 134* 133*  K 3.8 4.8 4.3 4.6 3.8  CL 100 101 102 100 96*  CO2 '25 25 25 27 26  '$ GLUCOSE 111* 138* 204* 175* 268*  BUN '12 11 12 16 18  '$ CREATININE 0.66 0.79 0.67 0.70 0.68  CALCIUM 8.7 9.4 9.2 9.3 9.5  AST 84* 67* 49* 56* 56*  ALT 150* 139* 117* 125* 132*  ALKPHOS 57 59 57 61 60  BILITOT 1.1 1.3* 0.5 0.8 1.2   ------------------------------------------------------------------------------------------------------------------ estimated creatinine clearance is 118.6 mL/min (by C-G formula based on Cr of 0.68). ------------------------------------------------------------------------------------------------------------------ No results for input(s): HGBA1C in the last 72 hours. ------------------------------------------------------------------------------------------------------------------ No results for input(s): CHOL, HDL, LDLCALC, TRIG, CHOLHDL, LDLDIRECT in the last 72 hours. ------------------------------------------------------------------------------------------------------------------ No results for input(s): TSH, T4TOTAL, T3FREE, THYROIDAB in the last 72 hours.  Invalid input(s): FREET3 ------------------------------------------------------------------------------------------------------------------ No results for input(s): VITAMINB12, FOLATE, FERRITIN, TIBC, IRON, RETICCTPCT in the last 72 hours.  Coagulation profile  Recent Labs Lab 05/22/14 1214  INR 0.97    No results for input(s):  DDIMER in the last 72 hours.  Cardiac Enzymes No results for input(s): CKMB, TROPONINI, MYOGLOBIN in the last 168 hours.  Invalid input(s): CK ------------------------------------------------------------------------------------------------------------------ Invalid input(s): POCBNP    Blen Ransome D.O. on 05/26/2014 at 2:04 PM  Between 7am to 7pm - Pager - [phone]  After 7pm go to [email or web address] - password [email or web address]  And look for the night coverage person covering for me after hours  [facility] Office  [phone]

## 2014-05-26 NOTE — Evaluation (Signed)
Physical Therapy Evaluation Patient Details Name: Ray Garcia MRN: [id number] DOB: 02-12-1960 Today's Date: 05/26/2014   History of Present Illness  This is a 54 y.o. year old male with significant past medical history of cirrhosis, hepatitis C, HTN, dental caries/abscess, ETOH abuse presenting with cerebellar mass. Pt underwent biopsy and now awaiting results to determine excision of tumor.  Clinical Impression  Pt admitted with above. Pt very aware of situation and is anxious regarding the results. He is aware if/when he has surgery to remove the tumor "he will have a long road." Pt currently functioning with a RW for safe amb and minA for safe transfers. Pt c/o mild dizziness with mobility. Pt with noted balance impairments as well. Acute PT to follow however will have to re-assess post-surgery to finalize d/c recommendations.    Follow Up Recommendations Home health PT;Supervision/Assistance - 24 hour (will have to re-assess s/p surgery)    Equipment Recommendations  Rolling walker with 5" wheels    Recommendations for Other Services       Precautions / Restrictions Precautions Precautions: Fall Restrictions Weight Bearing Restrictions: No      Mobility  Bed Mobility Overal bed mobility: Modified Independent             General bed mobility comments: pt demo'd safe technique with minimal use of hands for transfer to EOB  Transfers Overall transfer level: Needs assistance Equipment used: Rolling walker (2 wheeled) Transfers: Sit to/from Stand Sit to Stand: Min assist         General transfer comment: attempted sit to stand without RW however pt unsteady and desired to hold onto bed.pt with improved safety with RW  Ambulation/Gait Ambulation/Gait assistance: Min assist Ambulation Distance (Feet): 120 Feet Assistive device: Rolling walker (2 wheeled) Gait Pattern/deviations: Step-through pattern;Narrow base of support     General Gait Details: pt very  dependent on UEs, mild ataxia with occasional crossover gait pattern during turning. min v/c's for safe walker management. when attempted to amb without RW pt very unsteady and desiring to hold onto something. pt with wide base of support and short step length and height  Stairs            Wheelchair Mobility    Modified Rankin (Stroke Patients Only)       Balance Overall balance assessment: Needs assistance Sitting-balance support: No upper extremity supported;Feet supported Sitting balance-Leahy Scale: Fair     Standing balance support: Bilateral upper extremity supported Standing balance-Leahy Scale: Poor Standing balance comment: pt strongly desires to hold onto somethign in standing due to mild dizziness                             Pertinent Vitals/Pain Pain Assessment: No/denies pain    Home Living Family/patient expects to be discharged to:: Private residence Living Arrangements: Spouse/significant other Available Help at Discharge: Family;Available 24 hours/day (however spouse doesn't drive as she's legally blind) Type of Home: House Home Access: Stairs to enter Entrance Stairs-Rails: None Entrance Stairs-Number of Steps: 3 Home Layout: One level Home Equipment: Walker - 2 wheels;Cane - single point      Prior Function Level of Independence: Independent               Hand Dominance   Dominant Hand: Right    Extremity/Trunk Assessment   Upper Extremity Assessment: Overall WFL for tasks assessed           Lower Extremity Assessment: Overall [facility]  for tasks assessed (however mildly ataxic during ambulation)      Cervical / Trunk Assessment: Normal  Communication   Communication: No difficulties  Cognition Arousal/Alertness: Awake/alert Behavior During Therapy: WFL for tasks assessed/performed Overall Cognitive Status: Within Functional Limits for tasks assessed                      General Comments      Exercises         Assessment/Plan    PT Assessment Patient needs continued PT services  PT Diagnosis Difficulty walking   PT Problem List Decreased strength;Decreased activity tolerance;Decreased balance;Decreased mobility;Decreased coordination;Decreased knowledge of use of DME  PT Treatment Interventions DME instruction;Gait training;Stair training;Functional mobility training;Therapeutic activities;Therapeutic exercise;Balance training;Neuromuscular re-education   PT Goals (Current goals can be found in the Care Plan section) Acute Rehab PT Goals Patient Stated Goal: to get better PT Goal Formulation: With patient Time For Goal Achievement: 06/02/14 Potential to Achieve Goals: Good Additional Goals Additional Goal #1: Pt to score >19 on DGI to indicate minimal falls risk.    Frequency Min 3X/week   Barriers to discharge        Co-evaluation               End of Session Equipment Utilized During Treatment: Gait belt Activity Tolerance: Patient tolerated treatment well Patient left: in chair;with call bell/phone within reach Nurse Communication: Mobility status         Time: 6468-0321 PT Time Calculation (min) (ACUTE ONLY): 21 min   Charges:   PT Evaluation $Initial PT Evaluation Tier I: 1 Procedure     PT G CodesKingsley Callander 05/26/2014, 4:34 PM   Kittie Plater, PT, DPT Pager #: [phone] Office #: [phone]

## 2014-05-27 ENCOUNTER — Encounter: Payer: Self-pay | Admitting: Radiation Oncology

## 2014-05-27 ENCOUNTER — Inpatient Hospital Stay (HOSPITAL_COMMUNITY): Payer: Medicaid Other

## 2014-05-27 DIAGNOSIS — C3490 Malignant neoplasm of unspecified part of unspecified bronchus or lung: Secondary | ICD-10-CM

## 2014-05-27 LAB — CBC WITH DIFFERENTIAL/PLATELET
Basophils Absolute: 0 10*3/uL (ref 0.0–0.1)
Basophils Relative: 0 % (ref 0–1)
Eosinophils Absolute: 0 10*3/uL (ref 0.0–0.7)
Eosinophils Relative: 0 % (ref 0–5)
HCT: 45.5 % (ref 39.0–52.0)
HEMOGLOBIN: 16.4 g/dL (ref 13.0–17.0)
Lymphocytes Relative: 18 % (ref 12–46)
Lymphs Abs: 2.5 10*3/uL (ref 0.7–4.0)
MCH: 32.4 pg (ref 26.0–34.0)
MCHC: 36 g/dL (ref 30.0–36.0)
MCV: 89.9 fL (ref 78.0–100.0)
MONOS PCT: 7 % (ref 3–12)
Monocytes Absolute: 1 10*3/uL (ref 0.1–1.0)
NEUTROS ABS: 10.3 10*3/uL — AB (ref 1.7–7.7)
NEUTROS PCT: 75 % (ref 43–77)
Platelets: 153 10*3/uL (ref 150–400)
RBC: 5.06 MIL/uL (ref 4.22–5.81)
RDW: 13.4 % (ref 11.5–15.5)
WBC: 13.8 10*3/uL — ABNORMAL HIGH (ref 4.0–10.5)

## 2014-05-27 MED ORDER — GADOBENATE DIMEGLUMINE 529 MG/ML IV SOLN
20.0000 mL | Freq: Once | INTRAVENOUS | Status: AC | PRN
  Administered 2014-05-27: 20 mL via INTRAVENOUS

## 2014-05-27 NOTE — Consult Note (Cosign Needed)
Radiation Oncology         [phone]) [phone] ________________________________  Initial inpatient Consultation  Name: Ray Garcia MRN: [id number]  Date: 05/20/2014  DOB: [birth year]  HC:WCBJSE, [name], MD  No ref. provider found   REFERRING PHYSICIAN: Dr. 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 male who is transferred from [facility] on 05/21/14 after presenting with about 2 weeks of severe daily bouts of nausea and vomiting. In addition, he has been experiencing vertigo to the point of falling also in an episodic fashion whenever he is standing. He had been having primarily posterior HA.  Outside head CT showed a large left cerebellar mass. Brain MRI at [facility] on 05/21/2014 demonstrated a 32 x 25 x 37 mm diffusely enhancing mass in the lower left cerebellum with growth across the inferior cerebellar peduncle into the dorsal lateral medulla.      Subsequent CT of the chest abdomen and pelvis on 05/21/2014 demonstrated a large right upper lung mass suggestive of a primary lung cancer.    CT-guided core needle biopsy of the right upper lung mass on 05/23/2014 confirmed adenocarcinoma. The patient has kindly been referred today for discussion of potential treatment options, particularly related to his brain metastasis.  PREVIOUS RADIATION THERAPY: No  PAST MEDICAL HISTORY:  has a past medical history of ETOH abuse; Dental caries; Cirrhosis of liver; Hepatitis; and Arthritis.    PAST SURGICAL HISTORY:History reviewed. No pertinent past surgical history.  FAMILY HISTORY: family history is not on file.  SOCIAL HISTORY:  reports that he quit smoking about 4 weeks ago. His smoking use included Cigarettes. He has quit using smokeless tobacco. He reports that he does not drink alcohol  or use illicit drugs.  ALLERGIES: Amoxicillin; Ibuprofen; and Penicillins  MEDICATIONS:  Current Facility-Administered Medications  Medication Dose Route Frequency Provider Last Rate Last Dose  . clindamycin (CLEOCIN) capsule 300 mg  300 mg Oral 3 times per day Rise Patience, MD   300 mg at 05/27/14 1323  . dexamethasone (DECADRON) injection 4 mg  4 mg Intravenous 3 times per day Kelvin Cellar, MD   4 mg at 05/27/14 1323  . docusate sodium (COLACE) capsule 100 mg  100 mg Oral BID Ripudeep K Rai, MD   100 mg at 05/27/14 1059  . feeding supplement (ENSURE ENLIVE) (ENSURE ENLIVE) liquid 237 mL  237 mL Oral BID BM Rogue Bussing, RD   237 mL at 05/27/14 1323  . folic acid (FOLVITE) tablet 1 mg  1 mg Oral Daily Deneise Lever, MD   1 mg at 05/27/14 1000  . heparin injection 5,000 Units  5,000 Units Subcutaneous 3 times per day Deneise Lever, MD   5,000 Units at 05/27/14 1323  . morphine 2 MG/ML injection 1-2 mg  1-2 mg Intravenous Q3H PRN Deneise Lever, MD   2 mg at 05/27/14 1641  . multivitamin with minerals tablet 1 tablet  1 tablet Oral Daily Deneise Lever, MD   1 tablet at 05/27/14 1059  . ondansetron (ZOFRAN) tablet 4 mg  4 mg Oral Q6H PRN Deneise Lever, MD       Or  . ondansetron [facility]) injection 4 mg  4 mg Intravenous Q6H PRN Deneise Lever, MD   4 mg at 05/26/14 0520  .  polyethylene glycol (MIRALAX / GLYCOLAX) packet 17 g  17 g Oral Daily Consuella Lose, MD   17 g at 05/27/14 1059  . saccharomyces boulardii (FLORASTOR) capsule 250 mg  250 mg Oral BID Rise Patience, MD   250 mg at 05/27/14 1059  . thiamine (VITAMIN B-1) tablet 100 mg  100 mg Oral Daily Deneise Lever, MD   100 mg at 05/27/14 1059    REVIEW OF SYSTEMS:  A 15 point review of systems is documented in the electronic medical record. This was obtained by the nursing staff. However, I reviewed this with the patient to discuss relevant findings and make appropriate changes.  Pertinent items are  noted in HPI. his presenting symptoms have improved on dexamethasone   PHYSICAL EXAM:  height is 5' 9.6" (1.768 m) and weight is 204 lb 9.4 oz (92.8 kg). His oral temperature is 97.8 F (36.6 C). His blood pressure is 139/83 and his pulse is 67. His respiration is 20 and oxygen saturation is 98%.   Per neurosurgery at the time of admission:  Awake, alert, oriented Memory and concentration grossly intact Speech fluent, appropriate CN grossly intact, with end nystagmus on lateral gaze Motor exam: Upper Extremities Deltoid Bicep Tricep Grip  Right 5/5 5/5 5/5 5/5  Left 5/5 5/5 5/5 5/5   Lower Extremity IP Quad PF DF EHL  Right 5/5 5/5 5/5 5/5 5/5  Left 5/5 5/5 5/5 5/5 5/5   Sensation grossly intact to LT Past pointing on L        KPS = 90  100 - Normal; no complaints; no evidence of disease. 90   - Able to carry on normal activity; minor signs or symptoms of disease. 80   - Normal activity with effort; some signs or symptoms of disease. 58   - Cares for self; unable to carry on normal activity or to do active work. 60   - Requires occasional assistance, but is able to care for most of his personal needs. 50   - Requires considerable assistance and frequent medical care. 45   - Disabled; requires special care and assistance. 83   - Severely disabled; hospital admission is indicated although death not imminent. 38   - Very sick; hospital admission necessary; active supportive treatment necessary. 10   - Moribund; fatal processes progressing rapidly. 0     - Dead  Karnofsky DA, Abelmann [place], Craver LS and Burchenal JH [phone]) The use of the nitrogen mustards in the palliative treatment of carcinoma: with particular reference to bronchogenic carcinoma Cancer 1 [id number]  LABORATORY DATA:  Lab Results  Component Value Date   WBC 13.8* 05/27/2014   HGB 16.4 05/27/2014   HCT 45.5 05/27/2014   MCV 89.9 05/27/2014   PLT 153 05/27/2014   Lab Results    Component Value Date   NA 133* 05/25/2014   K 3.8 05/25/2014   CL 96* 05/25/2014   CO2 26 05/25/2014   Lab Results  Component Value Date   ALT 132* 05/25/2014   AST 56* 05/25/2014   ALKPHOS 60 05/25/2014   BILITOT 1.2 05/25/2014     RADIOGRAPHY: Ct Chest W Contrast  05/21/2014   CLINICAL DATA:  Cirrhosis, hepatitis C. Cerebellar mass. Lung mass on chest radiograph. Evaluate for metastatic disease.  EXAM: CT CHEST, ABDOMEN, AND PELVIS WITH CONTRAST  TECHNIQUE: Multidetector CT imaging of the chest, abdomen and pelvis was performed following the standard protocol during bolus administration of intravenous contrast.  CONTRAST:  153m  OMNIPAQUE IOHEXOL 300 MG/ML  SOLN  COMPARISON:  Chest radiograph dated 05/20/2014.  FINDINGS: CT CHEST FINDINGS  Mediastinum/Nodes: The heart is normal in size. No pericardial effusion.  Coronary atherosclerosis in the LAD.  9 mm short axis right hilar node (series 3/ image 23). Small right paratracheal nodes measuring up to 6 mm short axis (series 3/image 20). 7 mm short axis subcarinal node (series 3/ image 23).  No suspicious axillary or supraclavicular nodes.  Visualized thyroid is unremarkable.  Lungs/Pleura: Macrolobulated 6.6 x 7.7 x 6.4 cm mass in the posterior right upper lobe (series 4/image 14), which abuts the posterior chest wall/pleural surface, but does not demonstrate definite overlying osseous invasion.  No evidence of satellite nodularity or lymphangitic spread. Lungs are otherwise clear.  Mild dependent scarring/ atelectasis in the right lower lobe.  No pleural effusion or pneumothorax.  Musculoskeletal: Mild degenerative changes of the thoracic spine.  CT ABDOMEN PELVIS FINDINGS  Hepatobiliary: Cirrhosis. Mildly increased subcapsular perfusion inferiorly in thea posterior segment right hepatic lobe (series 3/image 70), which normalizes on the delayed phase, without definite underlying mass.  Cholelithiasis (series 3/ image 66). Gallbladder is  underdistended. No intrahepatic or extrahepatic ductal dilatation.  Pancreas: Within normal limits.  Spleen: Splenomegaly, measuring 15.9 cm in maximal craniocaudal dimension.  Adrenals/Urinary Tract: Adrenal glands are within normal limits.  Kidneys are within normal limits.  No hydronephrosis.  Layering excretory contrast (likely gadolinium) within the bladder.  Stomach/Bowel: Stomach is within normal limits.  No evidence of bowel obstruction.  Normal appendix.  Colonic diverticulosis, without evidence of diverticulitis.  Vascular/Lymphatic: Atherosclerotic calcifications of the abdominal aorta and branch vessels.  Small upper abdominal lymph nodes measuring up to 10 mm short axis, likely reactive.  Reproductive: Prostate is unremarkable.  Other: Moderate fat containing left inguinal/ scrotal hernia.  Musculoskeletal: Degenerative changes of the lumbar spine.  IMPRESSION: 7.7 cm posterior right upper lobe mass which abuts the posterior chest wall/pleural surface, compatible with primary bronchogenic neoplasm.  9 mm short axis right hilar node, worrisome for nodal metastasis. Additional small right paratracheal and subcarinal nodes, indeterminate.  No evidence of metastatic disease in the abdomen/pelvis.  Additional ancillary findings as above.   Electronically Signed   By: Julian Hy M.D.   On: 05/21/2014 13:21   Mr Jeri Cos CB Contrast  05/21/2014   CLINICAL DATA:  Dizziness with nausea and vomiting. Known brain mass.  EXAM: MRI HEAD WITHOUT AND WITH CONTRAST  TECHNIQUE: Multiplanar, multiecho pulse sequences of the brain and surrounding structures were obtained without and with intravenous contrast.  CONTRAST:  1m MULTIHANCE GADOBENATE DIMEGLUMINE 529 MG/ML IV SOLN  [facility]  FINDINGS: Calvarium and upper cervical spine: No focal marrow signal abnormality.  Orbits: No significant findings.  Sinuses: Retention cysts in the adenoid. No paranasal  sinus or mastoid obstruction.  Brain: There is diffusely enhancing mass located in the lower left cerebellum with growth across the inferior cerebellar peduncle into the dorsal lateral medulla. The mass measures 32 x 25 x 37 mm. T2 hypo intensity and partial diffusion restriction suggests dense cellularity. There could be minimal blood products internally, but no measurable hematoma. No additional mass lesion is seen. Vasogenic edema surrounds the mass, with crowding in the posterior fossa and upper displacement of the cerebellum. The lower fourth ventricle it is partially effaced, but there is no hydrocephalus.  No infarct, major vessel occlusion, for significant white matter disease.  IMPRESSION: 1. 32 x 25  x 37 mm densely cellular left cerebellar mass which extends into the left inferior peduncle and medulla. Given patient's right upper lobe mass, this is most consistent with a solitary metastasis. 2. Vasogenic edema partially effaces the lower fourth ventricle. No hydrocephalus.   Electronically Signed   By: Monte Fantasia M.D.   On: 05/21/2014 12:04   Ct Abdomen Pelvis W Contrast  05/21/2014   CLINICAL DATA:  Cirrhosis, hepatitis C. Cerebellar mass. Lung mass on chest radiograph. Evaluate for metastatic disease.  EXAM: CT CHEST, ABDOMEN, AND PELVIS WITH CONTRAST  TECHNIQUE: Multidetector CT imaging of the chest, abdomen and pelvis was performed following the standard protocol during bolus administration of intravenous contrast.  CONTRAST:  180m OMNIPAQUE IOHEXOL 300 MG/ML  SOLN  COMPARISON:  Chest radiograph dated 05/20/2014.  FINDINGS: CT CHEST FINDINGS  Mediastinum/Nodes: The heart is normal in size. No pericardial effusion.  Coronary atherosclerosis in the LAD.  9 mm short axis right hilar node (series 3/ image 23). Small right paratracheal nodes measuring up to 6 mm short axis (series 3/image 20). 7 mm short axis subcarinal node (series 3/ image 23).  No suspicious axillary or supraclavicular nodes.   Visualized thyroid is unremarkable.  Lungs/Pleura: Macrolobulated 6.6 x 7.7 x 6.4 cm mass in the posterior right upper lobe (series 4/image 14), which abuts the posterior chest wall/pleural surface, but does not demonstrate definite overlying osseous invasion.  No evidence of satellite nodularity or lymphangitic spread. Lungs are otherwise clear.  Mild dependent scarring/ atelectasis in the right lower lobe.  No pleural effusion or pneumothorax.  Musculoskeletal: Mild degenerative changes of the thoracic spine.  CT ABDOMEN PELVIS FINDINGS  Hepatobiliary: Cirrhosis. Mildly increased subcapsular perfusion inferiorly in thea posterior segment right hepatic lobe (series 3/image 70), which normalizes on the delayed phase, without definite underlying mass.  Cholelithiasis (series 3/ image 66). Gallbladder is underdistended. No intrahepatic or extrahepatic ductal dilatation.  Pancreas: Within normal limits.  Spleen: Splenomegaly, measuring 15.9 cm in maximal craniocaudal dimension.  Adrenals/Urinary Tract: Adrenal glands are within normal limits.  Kidneys are within normal limits.  No hydronephrosis.  Layering excretory contrast (likely gadolinium) within the bladder.  Stomach/Bowel: Stomach is within normal limits.  No evidence of bowel obstruction.  Normal appendix.  Colonic diverticulosis, without evidence of diverticulitis.  Vascular/Lymphatic: Atherosclerotic calcifications of the abdominal aorta and branch vessels.  Small upper abdominal lymph nodes measuring up to 10 mm short axis, likely reactive.  Reproductive: Prostate is unremarkable.  Other: Moderate fat containing left inguinal/ scrotal hernia.  Musculoskeletal: Degenerative changes of the lumbar spine.  IMPRESSION: 7.7 cm posterior right upper lobe mass which abuts the posterior chest wall/pleural surface, compatible with primary bronchogenic neoplasm.  9 mm short axis right hilar node, worrisome for nodal metastasis. Additional small right paratracheal and  subcarinal nodes, indeterminate.  No evidence of metastatic disease in the abdomen/pelvis.  Additional ancillary findings as above.   Electronically Signed   By: SJulian HyM.D.   On: 05/21/2014 13:21   Ct Biopsy  05/23/2014   CLINICAL DATA:  Right upper lobe mass.  Intracranial lesion.  EXAM: CT-GUIDED BIOPSY OF A RIGHT UPPER LOBE MASS.  CORE.  MEDICATIONS AND MEDICAL HISTORY: Versed 2 mg, Fentanyl 100 mcg.  Additional Medications: None.  ANESTHESIA/SEDATION: Moderate sedation time: 10 minutes  PROCEDURE: The procedure, risks, benefits, and alternatives were explained to the patient. Questions regarding the procedure were encouraged and answered. The patient understands and consents to the procedure.  The right upper posterior thorax was  prepped with Betadine in a sterile fashion, and a sterile drape was applied covering the operative field. A sterile gown and sterile gloves were used for the procedure.  Under CT guidance, a(n) 17 gauge guide needle was advanced into the right upper lobe lung mass. Subsequently 3 18 gauge core biopsies were obtained. The guide needle was removed. Final imaging was performed.  Patient tolerated the procedure well without complication. Vital sign monitoring by nursing staff during the procedure will continue as patient is in the special procedures unit for post procedure observation.  FINDINGS: The images document guide needle placement within the right upper lobe lung mass. Post biopsy images demonstrate no pneumothorax.  COMPLICATIONS: None  IMPRESSION: Successful CT-guided right upper lobe lung mass core biopsy.   Electronically Signed   By: Marybelle Killings M.D.   On: 05/23/2014 14:13   Dg Chest Port 1 View  05/23/2014   CLINICAL DATA:  Evaluate for pneumothorax after lung biopsy  EXAM: PORTABLE CHEST - 1 VIEW  COMPARISON:  None.  FINDINGS: The heart size and mediastinal contours are within normal limits. Right upper lobe lung mass is again noted. No pneumothorax  identified. The visualized skeletal structures are unremarkable.  IMPRESSION: 1. No pneumothorax noted following lung biopsy.   Electronically Signed   By: Kerby Moors M.D.   On: 05/23/2014 14:47      IMPRESSION: This patient is a very nice 54 year old gentleman with a solitary 3.7 cm brain metastasis in the left cerebellum from adenocarcinoma the right upper lung. The patient may benefit from radiotherapy to the brain. His options include whole brain radiotherapy versus stereotactic radiosurgery. Given the extent of edema and location of the tumor, he would be at risk for obstruction of the fourth ventricle with any treatment-related edema. In this setting, suboccipital craniectomy with tumor resection may be beneficial for cytoreduction and local control. In this setting, preoperative stereotactic radiosurgery provides a higher likelihood of local tumor control while avoiding the effects of whole brain radiotherapy including neurocognitive decline and reduced short-term memory.  PLAN:Today, I talked to the patient and family about the findings and work-up thus far.  We discussed the natural history of solitary brain metastases from lung cancer and general treatment, highlighting the role of surgical resection and radiotherapy in the management.  We discussed the available radiation techniques with emphasis on stereotactic radiosurgery, and focused on the details of logistics and delivery.  We reviewed the anticipated acute and late sequelae associated with radiation in this setting.  The patient was encouraged to ask questions that I answered to the best of my ability.  The patient would like to proceed with preoperative stereotactic radiosurgery followed by resection and will be scheduled for MRI for radiosurgery planning and image guided surgery.  I spent 20 minutes minutes face to face with the patient and more than 50% of that time was spent in counseling and/or coordination of care.  The patient's  presenting symptoms are relatively well controlled with dexamethasone.  We will work toward scheduling surgery with his neurosurgeon and planning preoperative stereotactic radiosurgery to be delivered up to 48 hours prior.   ------------------------------------------------  Sheral Apley Tammi Klippel, M.D.

## 2014-05-27 NOTE — Progress Notes (Signed)
[facility]                                                                              Patient Demographics  Ray Garcia, is a 54 y.o. male, DOB - 12/16/1960, OXB:[id number]  Admit date - 05/20/2014   Admitting Physician Lavina Hamman, MD  Outpatient Primary MD for the patient is [name], [name], MD  LOS - 7   No chief complaint on file.     HPI on 05/20/2014 by Dr. Shanda Howells This is a 54 y.o. year old male with significant past medical history of cirrhosis, hepatitis C, HTN, dental caries/abscess, ETOH abuse presenting with cerebellar mass. Patient presented today at [facility] with chief complaint of recurrent intermittent headaches as well as recurrent nausea and dizziness. States that symptoms have been fairly persistent for over the past 2 weeks. No reported head trauma. Reports only minimal alcohol intake. One to 2 beers per day. Previous heavy drinker. No hemiparesis or confusion. Also with noted dental caries and dental abscess. Is currently on oral penicillin for this. Presents his [facility] afebrile, hemodynamically stable. A head CT obtained that [facility] showed abnormal appearance of the left cerebellar hemisphere concerning for underlying mass lesion with associated edema. Per report, ER physician assistant discussed case with on-call neurosurgery at [facility] recommended medical admission with formal neurosurgical consultation. Labs are pending.   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 CT scan of lungs showing a 7.7 cm posterior right upper lobe mass, making this cerebellar mass lesion suspicious for metastasis -Radiation Onc and neurosurgery consulted consulted.  -Continue IV steroids- per neurosurgery, Dr. Kathyrn Sheriff, if non-small cell CA, may require resection of the cerebellar lesion  (note on 05/22/2014) -Spoke with Dr, Kathyrn Sheriff regarding results of biopsy (invavsive adenocarcinoma), and patient will likely need surgery with stereotactic radiation therapy -Will consult oncology  Probable metastatic lung cancer -Patient has a 30-pack-year smoking history, CT chest showed 7.7 cm posterior right upper lobe mass compatible with primary bronchogenic neoplasm. -Patient underwent CT-guided biopsy 4/29, Invasive adenocarcinoma primary to lung -Treatment and plan as above  Nausea/vomiting. -As mentioned above likely secondary to cerebellar mass lesion seen on brain imaging at outside hospital significantly improved after IV steroids  Liver cirrhosis: Likely due to hepatitis C and chronic alcoholism -CT abdomen and pelvis showed no evidence of metastatic disease in the abdomen and pelvis.   Chronic alcoholism. -Patient reported that his last drink about a month ago, currently does not exhibit signs or symptoms of alcohol withdrawal  Essential hypertension  -Currently stable  Constipation -Resolved  -Patient states he had a bowel movement, but is used to have 2 per day  Leukocytosis -Likely secondary to decadron -Continue to monitor  Dental caries/abscess -Was on PCN as an outpatient -Currently on clindamycin  Code Status: Full  Family Communication: None at bedside.  Disposition Plan: Admitted, Pending further Neurosurgery, Oncology recommendations.   Time Spent in minutes   30 minutes  Procedures  CT guided biopsy- by IR  Consults   Pulmonology Interventional radiology Neurosurgery  DVT Prophylaxis  Heparin  Lab Results  Component Value Date   PLT 153 05/27/2014    Medications  Scheduled Meds: . clindamycin  300 mg Oral 3 times per day  . dexamethasone  4 mg Intravenous 3 times per day  . docusate sodium  100 mg Oral BID  . feeding supplement (ENSURE ENLIVE)  237 mL Oral BID BM  . folic acid  1 mg Oral Daily  . heparin  5,000 Units  Subcutaneous 3 times per day  . multivitamin with minerals  1 tablet Oral Daily  . polyethylene glycol  17 g Oral Daily  . saccharomyces boulardii  250 mg Oral BID  . thiamine  100 mg Oral Daily   Continuous Infusions:  PRN Meds:.morphine injection, ondansetron **OR** ondansetron (ZOFRAN) IV  Antibiotics    Anti-infectives    Start     Dose/Rate Route Frequency Ordered Stop   05/22/14 0015  clindamycin (CLEOCIN) capsule 300 mg     300 mg Oral 3 times per day 05/21/14 2359     05/21/14 0645  penicillin v potassium (VEETID) tablet 500 mg  Status:  Discontinued     500 mg Oral 4 times per day 05/21/14 5093 05/21/14 2358      Subjective:   Ray Garcia seen and examined today.  Patient states he is feeling better as compared to previous days.  He cpmplains of dizziness when gets up and feels the room spinning.   He denies headache, chest pain, shortness of breath, abdominal pain.    Objective:   Filed Vitals:   05/26/14 2105 05/27/14 0158 05/27/14 0545 05/27/14 0939  BP: 142/79 130/67 130/79 127/64  Pulse: 64 61 53 56  Temp: 97.7 F (36.5 C) 98.3 F (36.8 C) 97.7 F (36.5 C) 98.1 F (36.7 C)  TempSrc: Oral Oral Oral Oral  Resp: '20 20 20 16  '$ Height:      Weight:      SpO2: 96% 97% 96% 95%    Wt Readings from Last 3 Encounters:  05/20/14 92.8 kg (204 lb 9.4 oz)     Intake/Output Summary (Last 24 hours) at 05/27/14 1306 Last data filed at 05/27/14 0940  Gross per 24 hour  Intake    240 ml  Output   2125 ml  Net  -1885 ml    Exam  General: Well developed, well nourished,  No distress  HEENT: NCAT, mucous membranes moist.   Cardiovascular: Normal S1/S2, RRR, no murmurs  Respiratory: Clear to auscultation   Abdomen: Soft, nontender, nondistended, + bowel sounds  Extremities: warm dry without cyanosis clubbing or edema  Neuro: AAOx3, nonfocal  Psych: Normal affect and demeanor, pleasant  Data Review   Micro Results No results found for this or any  previous visit (from the past 240 hour(s)).  Radiology Reports Ct Chest W Contrast  05/21/2014   CLINICAL DATA:  Cirrhosis, hepatitis C. Cerebellar mass. Lung mass on chest radiograph. Evaluate for metastatic disease.  EXAM: CT CHEST, ABDOMEN, AND PELVIS WITH CONTRAST  TECHNIQUE: Multidetector CT imaging of the chest, abdomen and pelvis was performed following the standard protocol during bolus administration of intravenous contrast.  CONTRAST:  169m OMNIPAQUE IOHEXOL 300 MG/ML  SOLN  COMPARISON:  Chest radiograph dated 05/20/2014.  FINDINGS: CT CHEST FINDINGS  Mediastinum/Nodes: The heart is normal in size. No pericardial effusion.  Coronary atherosclerosis in the LAD.  9 mm short axis right hilar node (series 3/ image 23). Small right paratracheal nodes measuring up to 6 mm  short axis (series 3/image 20). 7 mm short axis subcarinal node (series 3/ image 23).  No suspicious axillary or supraclavicular nodes.  Visualized thyroid is unremarkable.  Lungs/Pleura: Macrolobulated 6.6 x 7.7 x 6.4 cm mass in the posterior right upper lobe (series 4/image 14), which abuts the posterior chest wall/pleural surface, but does not demonstrate definite overlying osseous invasion.  No evidence of satellite nodularity or lymphangitic spread. Lungs are otherwise clear.  Mild dependent scarring/ atelectasis in the right lower lobe.  No pleural effusion or pneumothorax.  Musculoskeletal: Mild degenerative changes of the thoracic spine.  CT ABDOMEN PELVIS FINDINGS  Hepatobiliary: Cirrhosis. Mildly increased subcapsular perfusion inferiorly in thea posterior segment right hepatic lobe (series 3/image 70), which normalizes on the delayed phase, without definite underlying mass.  Cholelithiasis (series 3/ image 66). Gallbladder is underdistended. No intrahepatic or extrahepatic ductal dilatation.  Pancreas: Within normal limits.  Spleen: Splenomegaly, measuring 15.9 cm in maximal craniocaudal dimension.  Adrenals/Urinary Tract:  Adrenal glands are within normal limits.  Kidneys are within normal limits.  No hydronephrosis.  Layering excretory contrast (likely gadolinium) within the bladder.  Stomach/Bowel: Stomach is within normal limits.  No evidence of bowel obstruction.  Normal appendix.  Colonic diverticulosis, without evidence of diverticulitis.  Vascular/Lymphatic: Atherosclerotic calcifications of the abdominal aorta and branch vessels.  Small upper abdominal lymph nodes measuring up to 10 mm short axis, likely reactive.  Reproductive: Prostate is unremarkable.  Other: Moderate fat containing left inguinal/ scrotal hernia.  Musculoskeletal: Degenerative changes of the lumbar spine.  IMPRESSION: 7.7 cm posterior right upper lobe mass which abuts the posterior chest wall/pleural surface, compatible with primary bronchogenic neoplasm.  9 mm short axis right hilar node, worrisome for nodal metastasis. Additional small right paratracheal and subcarinal nodes, indeterminate.  No evidence of metastatic disease in the abdomen/pelvis.  Additional ancillary findings as above.   Electronically Signed   By: Julian Hy M.D.   On: 05/21/2014 13:21   Mr Jeri Cos CB Contrast  05/21/2014   CLINICAL DATA:  Dizziness with nausea and vomiting. Known brain mass.  EXAM: MRI HEAD WITHOUT AND WITH CONTRAST  TECHNIQUE: Multiplanar, multiecho pulse sequences of the brain and surrounding structures were obtained without and with intravenous contrast.  CONTRAST:  64m MULTIHANCE GADOBENATE DIMEGLUMINE 529 MG/ML IV SOLN  [facility]  FINDINGS: Calvarium and upper cervical spine: No focal marrow signal abnormality.  Orbits: No significant findings.  Sinuses: Retention cysts in the adenoid. No paranasal sinus or mastoid obstruction.  Brain: There is diffusely enhancing mass located in the lower left cerebellum with growth across the inferior cerebellar peduncle into the dorsal lateral medulla. The  mass measures 32 x 25 x 37 mm. T2 hypo intensity and partial diffusion restriction suggests dense cellularity. There could be minimal blood products internally, but no measurable hematoma. No additional mass lesion is seen. Vasogenic edema surrounds the mass, with crowding in the posterior fossa and upper displacement of the cerebellum. The lower fourth ventricle it is partially effaced, but there is no hydrocephalus.  No infarct, major vessel occlusion, for significant white matter disease.  IMPRESSION: 1. 32 x 25 x 37 mm densely cellular left cerebellar mass which extends into the left inferior peduncle and medulla. Given patient's right upper lobe mass, this is most consistent with a solitary metastasis. 2. Vasogenic edema partially effaces the lower fourth ventricle. No hydrocephalus.   Electronically Signed   By: JMonte FantasiaM.D.   On:  05/21/2014 12:04   Ct Abdomen Pelvis W Contrast  05/21/2014   CLINICAL DATA:  Cirrhosis, hepatitis C. Cerebellar mass. Lung mass on chest radiograph. Evaluate for metastatic disease.  EXAM: CT CHEST, ABDOMEN, AND PELVIS WITH CONTRAST  TECHNIQUE: Multidetector CT imaging of the chest, abdomen and pelvis was performed following the standard protocol during bolus administration of intravenous contrast.  CONTRAST:  111m OMNIPAQUE IOHEXOL 300 MG/ML  SOLN  COMPARISON:  Chest radiograph dated 05/20/2014.  FINDINGS: CT CHEST FINDINGS  Mediastinum/Nodes: The heart is normal in size. No pericardial effusion.  Coronary atherosclerosis in the LAD.  9 mm short axis right hilar node (series 3/ image 23). Small right paratracheal nodes measuring up to 6 mm short axis (series 3/image 20). 7 mm short axis subcarinal node (series 3/ image 23).  No suspicious axillary or supraclavicular nodes.  Visualized thyroid is unremarkable.  Lungs/Pleura: Macrolobulated 6.6 x 7.7 x 6.4 cm mass in the posterior right upper lobe (series 4/image 14), which abuts the posterior chest wall/pleural surface,  but does not demonstrate definite overlying osseous invasion.  No evidence of satellite nodularity or lymphangitic spread. Lungs are otherwise clear.  Mild dependent scarring/ atelectasis in the right lower lobe.  No pleural effusion or pneumothorax.  Musculoskeletal: Mild degenerative changes of the thoracic spine.  CT ABDOMEN PELVIS FINDINGS  Hepatobiliary: Cirrhosis. Mildly increased subcapsular perfusion inferiorly in thea posterior segment right hepatic lobe (series 3/image 70), which normalizes on the delayed phase, without definite underlying mass.  Cholelithiasis (series 3/ image 66). Gallbladder is underdistended. No intrahepatic or extrahepatic ductal dilatation.  Pancreas: Within normal limits.  Spleen: Splenomegaly, measuring 15.9 cm in maximal craniocaudal dimension.  Adrenals/Urinary Tract: Adrenal glands are within normal limits.  Kidneys are within normal limits.  No hydronephrosis.  Layering excretory contrast (likely gadolinium) within the bladder.  Stomach/Bowel: Stomach is within normal limits.  No evidence of bowel obstruction.  Normal appendix.  Colonic diverticulosis, without evidence of diverticulitis.  Vascular/Lymphatic: Atherosclerotic calcifications of the abdominal aorta and branch vessels.  Small upper abdominal lymph nodes measuring up to 10 mm short axis, likely reactive.  Reproductive: Prostate is unremarkable.  Other: Moderate fat containing left inguinal/ scrotal hernia.  Musculoskeletal: Degenerative changes of the lumbar spine.  IMPRESSION: 7.7 cm posterior right upper lobe mass which abuts the posterior chest wall/pleural surface, compatible with primary bronchogenic neoplasm.  9 mm short axis right hilar node, worrisome for nodal metastasis. Additional small right paratracheal and subcarinal nodes, indeterminate.  No evidence of metastatic disease in the abdomen/pelvis.  Additional ancillary findings as above.   Electronically Signed   By: SJulian HyM.D.   On:  05/21/2014 13:21   Ct Biopsy  05/23/2014   CLINICAL DATA:  Right upper lobe mass.  Intracranial lesion.  EXAM: CT-GUIDED BIOPSY OF A RIGHT UPPER LOBE MASS.  CORE.  MEDICATIONS AND MEDICAL HISTORY: Versed 2 mg, Fentanyl 100 mcg.  Additional Medications: None.  ANESTHESIA/SEDATION: Moderate sedation time: 10 minutes  PROCEDURE: The procedure, risks, benefits, and alternatives were explained to the patient. Questions regarding the procedure were encouraged and answered. The patient understands and consents to the procedure.  The right upper posterior thorax was prepped with Betadine in a sterile fashion, and a sterile drape was applied covering the operative field. A sterile gown and sterile gloves were used for the procedure.  Under CT guidance, a(n) 17 gauge guide needle was advanced into the right upper lobe lung mass. Subsequently 3 18 gauge core biopsies were obtained. The guide  needle was removed. Final imaging was performed.  Patient tolerated the procedure well without complication. Vital sign monitoring by nursing staff during the procedure will continue as patient is in the special procedures unit for post procedure observation.  FINDINGS: The images document guide needle placement within the right upper lobe lung mass. Post biopsy images demonstrate no pneumothorax.  COMPLICATIONS: None  IMPRESSION: Successful CT-guided right upper lobe lung mass core biopsy.   Electronically Signed   By: Marybelle Killings M.D.   On: 05/23/2014 14:13   Dg Chest Port 1 View  05/23/2014   CLINICAL DATA:  Evaluate for pneumothorax after lung biopsy  EXAM: PORTABLE CHEST - 1 VIEW  COMPARISON:  None.  FINDINGS: The heart size and mediastinal contours are within normal limits. Right upper lobe lung mass is again noted. No pneumothorax identified. The visualized skeletal structures are unremarkable.  IMPRESSION: 1. No pneumothorax noted following lung biopsy.   Electronically Signed   By: Kerby Moors M.D.   On: 05/23/2014 14:47     CBC  Recent Labs Lab 05/23/14 0431 05/24/14 0458 05/25/14 0805 05/26/14 0710 05/27/14 0635  WBC 11.1* 11.6* 12.6* 13.3* 13.8*  HGB 14.7 15.0 15.9 16.4 16.4  HCT 40.4 41.8 43.7 45.5 45.5  PLT 137* 136* 163 154 153  MCV 88.2 89.1 88.6 88.7 89.9  MCH 32.1 32.0 32.3 32.0 32.4  MCHC 36.4* 35.9 36.4* 36.0 36.0  RDW 12.6 12.9 13.0 12.9 13.4  LYMPHSABS 1.6 2.2 2.2 2.5 2.5  MONOABS 0.7 0.7 0.4 0.9 1.0  EOSABS 0.0 0.0 0.0 0.0 0.0  BASOSABS 0.0 0.0 0.0 0.0 0.0    Chemistries   Recent Labs Lab 05/21/14 0409 05/22/14 0458 05/23/14 0431 05/24/14 0458 05/25/14 0805  NA 134* 136 136 134* 133*  K 3.8 4.8 4.3 4.6 3.8  CL 100 101 102 100 96*  CO2 '25 25 25 27 26  '$ GLUCOSE 111* 138* 204* 175* 268*  BUN '12 11 12 16 18  '$ CREATININE 0.66 0.79 0.67 0.70 0.68  CALCIUM 8.7 9.4 9.2 9.3 9.5  AST 84* 67* 49* 56* 56*  ALT 150* 139* 117* 125* 132*  ALKPHOS 57 59 57 61 60  BILITOT 1.1 1.3* 0.5 0.8 1.2   ------------------------------------------------------------------------------------------------------------------ estimated creatinine clearance is 118.6 mL/min (by C-G formula based on Cr of 0.68). ------------------------------------------------------------------------------------------------------------------ No results for input(s): HGBA1C in the last 72 hours. ------------------------------------------------------------------------------------------------------------------ No results for input(s): CHOL, HDL, LDLCALC, TRIG, CHOLHDL, LDLDIRECT in the last 72 hours. ------------------------------------------------------------------------------------------------------------------ No results for input(s): TSH, T4TOTAL, T3FREE, THYROIDAB in the last 72 hours.  Invalid input(s): FREET3 ------------------------------------------------------------------------------------------------------------------ No results for input(s): VITAMINB12, FOLATE, FERRITIN, TIBC, IRON, RETICCTPCT in the last 72  hours.  Coagulation profile  Recent Labs Lab 05/22/14 1214  INR 0.97    No results for input(s): DDIMER in the last 72 hours.  Cardiac Enzymes No results for input(s): CKMB, TROPONINI, MYOGLOBIN in the last 168 hours.  Invalid input(s): CK ------------------------------------------------------------------------------------------------------------------ Invalid input(s): POCBNP    Keiran Gaffey D.O. on 05/27/2014 at 1:06 PM  Between 7am to 7pm - Pager - [phone]  After 7pm go to [email or web address] - password [email or web address]  And look for the night coverage person covering for me after hours  [facility] Office  [phone]

## 2014-05-27 NOTE — Progress Notes (Signed)
No issues overnight. Pt remains unchnaged with some nausea but significantly improved with steroids.  EXAM:  BP 139/83 mmHg  Pulse 67  Temp(Src) 97.8 F (36.6 C) (Oral)  Resp 20  Ht 5' 9.6" (1.768 m)  Wt 92.8 kg (204 lb 9.4 oz)  BMI 29.69 kg/m2  SpO2 98%  Awake, alert, oriented  Speech fluent, appropriate  CN grossly intact  5/5 BUE/BLE   IMPRESSION:  54 y.o. male with primary lung adenocarcinoma and cerebellar metastasis  PLAN: - Discussed with Dr. Tammi Klippel, rad onc. Will likely plan on preoperative SRS on Fri with preop 3T MRI brain w/w/o Gad, and surgical resection early next week (Mon or Tues). Given the invasion of the medullar, surgery would be primarly cytoreductive, not for complete resection. - Cont steroids  I did review the biopsy findings and the plan above with the patient. All his questions were answered.

## 2014-05-28 ENCOUNTER — Other Ambulatory Visit: Payer: Self-pay | Admitting: Neurosurgery

## 2014-05-28 ENCOUNTER — Ambulatory Visit
Admit: 2014-05-28 | Discharge: 2014-05-28 | Disposition: A | Discharge status: Home / Self Care | Payer: Self-pay | Attending: Radiation Oncology | Admitting: Radiation Oncology

## 2014-05-28 ENCOUNTER — Other Ambulatory Visit: Payer: Self-pay | Admitting: Hematology and Oncology

## 2014-05-28 DIAGNOSIS — C7931 Secondary malignant neoplasm of brain: Secondary | ICD-10-CM

## 2014-05-28 DIAGNOSIS — D72829 Elevated white blood cell count, unspecified: Secondary | ICD-10-CM

## 2014-05-28 DIAGNOSIS — E440 Moderate protein-calorie malnutrition: Secondary | ICD-10-CM | POA: Insufficient documentation

## 2014-05-28 DIAGNOSIS — C3411 Malignant neoplasm of upper lobe, right bronchus or lung: Secondary | ICD-10-CM

## 2014-05-28 DIAGNOSIS — D6959 Other secondary thrombocytopenia: Secondary | ICD-10-CM

## 2014-05-28 DIAGNOSIS — D630 Anemia in neoplastic disease: Secondary | ICD-10-CM

## 2014-05-28 LAB — CBC WITH DIFFERENTIAL/PLATELET
BASOS PCT: 0 % (ref 0–1)
Basophils Absolute: 0 10*3/uL (ref 0.0–0.1)
Eosinophils Absolute: 0 10*3/uL (ref 0.0–0.7)
Eosinophils Relative: 0 % (ref 0–5)
HCT: 45.2 % (ref 39.0–52.0)
HEMOGLOBIN: 16 g/dL (ref 13.0–17.0)
LYMPHS ABS: 1.4 10*3/uL (ref 0.7–4.0)
LYMPHS PCT: 14 % (ref 12–46)
MCH: 32.1 pg (ref 26.0–34.0)
MCHC: 35.4 g/dL (ref 30.0–36.0)
MCV: 90.6 fL (ref 78.0–100.0)
Monocytes Absolute: 0.9 10*3/uL (ref 0.1–1.0)
Monocytes Relative: 9 % (ref 3–12)
NEUTROS PCT: 77 % (ref 43–77)
Neutro Abs: 8.1 10*3/uL — ABNORMAL HIGH (ref 1.7–7.7)
Platelets: 132 10*3/uL — ABNORMAL LOW (ref 150–400)
RBC: 4.99 MIL/uL (ref 4.22–5.81)
RDW: 13.6 % (ref 11.5–15.5)
WBC: 10.4 10*3/uL (ref 4.0–10.5)

## 2014-05-28 NOTE — Progress Notes (Signed)
Physical Therapy Treatment Patient Details Name: Ray Garcia MRN: [id number] DOB: [birth year] Today's Date: 05/28/2014    History of Present Illness This is a 54 y.o. year old male with significant past medical history of cirrhosis, hepatitis C, HTN, dental caries/abscess, ETOH abuse presenting with cerebellar mass. Pt underwent biopsy and now awaiting results to determine excision of tumor.    PT Comments    Pt continues to c/o dizziness during mobility and needs cues for stabilizing gaze as dizziness worse when pt distracted while ambulating.  Noted plan for surgical intervention early next week.  Will continue to follow and need new orders to re-evaluate post-op.    Follow Up Recommendations  Home health PT;Supervision/Assistance - 24 hour (Will reassess after surgery.)     Equipment Recommendations  Rolling walker with 5" wheels    Recommendations for Other Services       Precautions / Restrictions Precautions Precautions: Fall Restrictions Weight Bearing Restrictions: No    Mobility  Bed Mobility Overal bed mobility: Modified Independent                Transfers Overall transfer level: Needs assistance Equipment used: Rolling walker (2 wheeled) Transfers: Sit to/from Stand Sit to Stand: Min guard         General transfer comment: cues for UE use and getting closer to bed prior to sitting.  pt indicates dizziness and needs cues for gaze stabilization.    Ambulation/Gait Ambulation/Gait assistance: Min assist Ambulation Distance (Feet): 120 Feet Assistive device: Rolling walker (2 wheeled) Gait Pattern/deviations: Step-through pattern;Decreased stride length;Narrow base of support     General Gait Details: pt relies on UE support on RW for support and security.  pt c/o dizziness and nystagmus noted when pt performs head turns.  Cues for stabilizing gaze, but pt with trouble following.  Mild scissoring during turns, worse when not focused on task.      Stairs            Wheelchair Mobility    Modified Rankin (Stroke Patients Only)       Balance Overall balance assessment: Needs assistance Sitting-balance support: No upper extremity supported;Feet supported Sitting balance-Leahy Scale: Fair     Standing balance support: Bilateral upper extremity supported;During functional activity Standing balance-Leahy Scale: Poor Standing balance comment: Heavy reliance on UEs for support and security.                      Cognition Arousal/Alertness: Awake/alert Behavior During Therapy: WFL for tasks assessed/performed Overall Cognitive Status: Within Functional Limits for tasks assessed                      Exercises      General Comments        Pertinent Vitals/Pain Pain Assessment: No/denies pain    Home Living                      Prior Function            PT Goals (current goals can now be found in the care plan section) Acute Rehab PT Goals Patient Stated Goal: to get better PT Goal Formulation: With patient Time For Goal Achievement: 06/02/14 Potential to Achieve Goals: Good Progress towards PT goals: Progressing toward goals    Frequency  Min 3X/week    PT Plan Current plan remains appropriate    Co-evaluation             End  of Session Equipment Utilized During Treatment: Gait belt Activity Tolerance: Patient tolerated treatment well Patient left: in bed;with call bell/phone within reach;with bed alarm set     Time: 1052-1110 PT Time Calculation (min) (ACUTE ONLY): 18 min  Charges:  $Gait Training: 8-22 mins                    G CodesCatarina Hartshorn, [place] 05/28/2014, 3:31 PM

## 2014-05-28 NOTE — Progress Notes (Signed)
Nutrition Follow-up  DOCUMENTATION CODES:  Non-severe (moderate) malnutrition in context of acute illness/injury    INTERVENTION:   Continue Ensure Enlive po BID, each supplement provides 350 kcal and 20 grams of protein Encourage PO intake  NUTRITION DIAGNOSIS:  Increased nutrient needs related to catabolic illness, cancer and cancer related treatments as evidenced by estimated needs; ongoing  GOAL:    Pt to meet >/= 90% of their estimated nutrition needs   ongoing  MONITOR:   PO intake, weight trend, labs, plan of care  REASON FOR ASSESSMENT:  Malnutrition Screening Tool    ASSESSMENT: 54 y.o. year old male with significant past medical history of cirrhosis, hepatitis C, HTN, dental caries/abscess, ETOH abuse presenting with cerebellar mass.  Pt states that for one week PTA he was unable to tolerate any PO's due to tumor pressure on brain causing headaches, nausea, and vomiting. Pt states he usually weighs between 210 and 230 lbs; lost 6 lbs in one week (3% weight loss). Pt states his appetite as returned and he is eating 100% of meals, drinking Ensure supplements, and eating snacks. Pt with moderate muscle wasting.   Labs: low sodium, elevated glucose  Height:  Ht Readings from Last 1 Encounters:  05/20/14 5' 9.6" (1.768 m)    Weight:  Wt Readings from Last 1 Encounters:  05/20/14 204 lb 9.4 oz (92.8 kg)    Ideal Body Weight:  75.5 kg  Wt Readings from Last 10 Encounters:  05/20/14 204 lb 9.4 oz (92.8 kg)    BMI:  Body mass index is 29.69 kg/(m^2).  Estimated Nutritional Needs:  Kcal:  4462-8638  Protein:  100-115 grams  Fluid:  2.3-2.6 L/day  Skin:    closed incision on upper back  Diet Order:  Diet heart healthy/carb modified Room service appropriate?: Yes; Fluid consistency:: Thin  EDUCATION NEEDS:   none   Intake/Output Summary (Last 24 hours) at 05/28/14 1436 Last data filed at 05/28/14 1413  Gross per 24 hour  Intake    880 ml   Output   1800 ml  Net   -920 ml    Last BM:  5/3  Pryor Ochoa RD, LDN Inpatient Clinical Dietitian Pager: [phone] After Hours Pager: [phone]

## 2014-05-28 NOTE — Progress Notes (Signed)
[facility]                                                                              Patient Demographics  Ray Garcia, is a 54 y.o. male, DOB - 06/06/1960, ZOX:[id number]  Admit date - 05/20/2014   Admitting Physician Lavina Hamman, MD  Outpatient Primary MD for the patient is [name], [name], MD  LOS - 8   No chief complaint on file.     HPI on 05/20/2014 by Dr. Shanda Howells This is a 54 y.o. year old male with significant past medical history of cirrhosis, hepatitis C, HTN, dental caries/abscess, ETOH abuse presenting with cerebellar mass. Patient presented today at [facility] with chief complaint of recurrent intermittent headaches as well as recurrent nausea and dizziness. States that symptoms have been fairly persistent for over the past 2 weeks. No reported head trauma. Reports only minimal alcohol intake. One to 2 beers per day. Previous heavy drinker. No hemiparesis or confusion. Also with noted dental caries and dental abscess. Is currently on oral penicillin for this. Presents his [facility] afebrile, hemodynamically stable. A head CT obtained that [facility] showed abnormal appearance of the left cerebellar hemisphere concerning for underlying mass lesion with associated edema. Per report, ER physician assistant discussed case with on-call neurosurgery at [facility] recommended medical admission with formal neurosurgical consultation. Labs are pending.   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 CT scan of lungs showing a 7.7 cm posterior right upper lobe mass, making this cerebellar mass lesion suspicious for metastasis -Radiation Onc and neurosurgery consulted consulted.  -Continue IV steroids- per neurosurgery, Dr. Kathyrn Sheriff, if non-small cell CA, may require resection of the cerebellar lesion  (note on 05/22/2014) -Spoke with Dr, Kathyrn Sheriff (on 05/27/2014) regarding results of biopsy (invavsive adenocarcinoma), and patient will likely need surgery with stereotactic radiation therapy/resection on ?06/02/2014 -Consulted oncology, Dr. Alvy Bimler   Probable metastatic lung cancer -Patient has a 30-pack-year smoking history, CT chest showed 7.7 cm posterior right upper lobe mass compatible with primary bronchogenic neoplasm. -Patient underwent CT-guided biopsy 4/29, Invasive adenocarcinoma primary to lung -Treatment and plan as above  Nausea/vomiting. -As mentioned above likely secondary to cerebellar mass lesion seen on brain imaging at outside hospital significantly improved after IV steroids  Liver cirrhosis: Likely due to hepatitis C and chronic alcoholism -CT abdomen and pelvis showed no evidence of metastatic disease in the abdomen and pelvis.   Chronic alcoholism. -Patient reported that his last drink about a month ago, currently does not exhibit signs or symptoms of alcohol withdrawal  Essential hypertension  -Currently stable  Constipation -Resolved  -Patient states he had a bowel movement, but is used to have 2 per day  Leukocytosis -Improving, Likely secondary to decadron -Continue to monitor  Dental caries/abscess -Was on PCN as an outpatient -Currently on clindamycin  Code Status: Full  Family Communication: None at bedside. (Patient wanted to speak to his wife.)   Disposition Plan: Admitted, pending further workup and resection on 5/9?   Time Spent in minutes   30  minutes  Procedures  CT guided biopsy- by IR  Consults   Pulmonology Interventional radiology Neurosurgery Oncology  DVT Prophylaxis  Heparin  Lab Results  Component Value Date   PLT 132* 05/28/2014    Medications  Scheduled Meds: . clindamycin  300 mg Oral 3 times per day  . dexamethasone  4 mg Intravenous 3 times per day  . docusate sodium  100 mg Oral BID  . feeding supplement  (ENSURE ENLIVE)  237 mL Oral BID BM  . folic acid  1 mg Oral Daily  . heparin  5,000 Units Subcutaneous 3 times per day  . multivitamin with minerals  1 tablet Oral Daily  . polyethylene glycol  17 g Oral Daily  . saccharomyces boulardii  250 mg Oral BID  . thiamine  100 mg Oral Daily   Continuous Infusions:  PRN Meds:.morphine injection, ondansetron **OR** ondansetron (ZOFRAN) IV  Antibiotics    Anti-infectives    Start     Dose/Rate Route Frequency Ordered Stop   05/22/14 0015  clindamycin (CLEOCIN) capsule 300 mg     300 mg Oral 3 times per day 05/21/14 2359     05/21/14 0645  penicillin v potassium (VEETID) tablet 500 mg  Status:  Discontinued     500 mg Oral 4 times per day 05/21/14 1779 05/21/14 2358      Subjective:   Ray Garcia seen and examined today.  Patient states he is feeling better as compared to previous days and really appreciates the care he has been receiving.  He complains of dizziness when standing.  Currently he denies headache, chest pain, shortness of breath, abdominal pain.    Objective:   Filed Vitals:   05/27/14 2149 05/28/14 0120 05/28/14 0542 05/28/14 1036  BP: 138/79 156/89 128/74 144/83  Pulse: 71 67 68 63  Temp: 97.8 F (36.6 C) 97.9 F (36.6 C) 97.8 F (36.6 C) 97.7 F (36.5 C)  TempSrc: Oral Oral Oral Oral  Resp: '20 20 18 19  '$ Height:      Weight:      SpO2: 95% 97% 97% 96%    Wt Readings from Last 3 Encounters:  05/20/14 92.8 kg (204 lb 9.4 oz)     Intake/Output Summary (Last 24 hours) at 05/28/14 1233 Last data filed at 05/28/14 1040  Gross per 24 hour  Intake   1120 ml  Output   1400 ml  Net   -280 ml    Exam  General: Well developed, well nourished,  No distress  HEENT: NCAT, mucous membranes moist.   Cardiovascular: Normal S1/S2, RRR, no murmurs  Respiratory: Clear to auscultation   Abdomen: Soft, nontender, nondistended, + bowel sounds  Extremities: warm dry without cyanosis clubbing or edema  Neuro:  AAOx3, nonfocal  Psych: Appropriate mood and affect  Data Review   Micro Results No results found for this or any previous visit (from the past 240 hour(s)).  Radiology Reports Ct Chest W Contrast  05/21/2014   CLINICAL DATA:  Cirrhosis, hepatitis C. Cerebellar mass. Lung mass on chest radiograph. Evaluate for metastatic disease.  EXAM: CT CHEST, ABDOMEN, AND PELVIS WITH CONTRAST  TECHNIQUE: Multidetector CT imaging of the chest, abdomen and pelvis was performed following the standard protocol during bolus administration of intravenous contrast.  CONTRAST:  171m OMNIPAQUE IOHEXOL 300 MG/ML  SOLN  COMPARISON:  Chest radiograph dated 05/20/2014.  FINDINGS: CT CHEST FINDINGS  Mediastinum/Nodes: The heart is normal in size. No pericardial effusion.  Coronary atherosclerosis in the LAD.  9 mm short axis right hilar node (series 3/ image 23). Small right paratracheal nodes measuring up to 6 mm short axis (series 3/image 20). 7 mm short axis subcarinal node (series 3/ image 23).  No suspicious axillary or supraclavicular nodes.  Visualized thyroid is unremarkable.  Lungs/Pleura: Macrolobulated 6.6 x 7.7 x 6.4 cm mass in the posterior right upper lobe (series 4/image 14), which abuts the posterior chest wall/pleural surface, but does not demonstrate definite overlying osseous invasion.  No evidence of satellite nodularity or lymphangitic spread. Lungs are otherwise clear.  Mild dependent scarring/ atelectasis in the right lower lobe.  No pleural effusion or pneumothorax.  Musculoskeletal: Mild degenerative changes of the thoracic spine.  CT ABDOMEN PELVIS FINDINGS  Hepatobiliary: Cirrhosis. Mildly increased subcapsular perfusion inferiorly in thea posterior segment right hepatic lobe (series 3/image 70), which normalizes on the delayed phase, without definite underlying mass.  Cholelithiasis (series 3/ image 66). Gallbladder is underdistended. No intrahepatic or extrahepatic ductal dilatation.  Pancreas: Within  normal limits.  Spleen: Splenomegaly, measuring 15.9 cm in maximal craniocaudal dimension.  Adrenals/Urinary Tract: Adrenal glands are within normal limits.  Kidneys are within normal limits.  No hydronephrosis.  Layering excretory contrast (likely gadolinium) within the bladder.  Stomach/Bowel: Stomach is within normal limits.  No evidence of bowel obstruction.  Normal appendix.  Colonic diverticulosis, without evidence of diverticulitis.  Vascular/Lymphatic: Atherosclerotic calcifications of the abdominal aorta and branch vessels.  Small upper abdominal lymph nodes measuring up to 10 mm short axis, likely reactive.  Reproductive: Prostate is unremarkable.  Other: Moderate fat containing left inguinal/ scrotal hernia.  Musculoskeletal: Degenerative changes of the lumbar spine.  IMPRESSION: 7.7 cm posterior right upper lobe mass which abuts the posterior chest wall/pleural surface, compatible with primary bronchogenic neoplasm.  9 mm short axis right hilar node, worrisome for nodal metastasis. Additional small right paratracheal and subcarinal nodes, indeterminate.  No evidence of metastatic disease in the abdomen/pelvis.  Additional ancillary findings as above.   Electronically Signed   By: Julian Hy M.D.   On: 05/21/2014 13:21   Mr Jeri Cos QI Contrast  05/27/2014   CLINICAL DATA:  Metastatic disease.  Lung mass.  EXAM: MRI HEAD WITHOUT AND WITH CONTRAST  TECHNIQUE: Multiplanar, multiecho pulse sequences of the brain and surrounding structures were obtained without and with intravenous contrast.  CONTRAST:  53m MULTIHANCE GADOBENATE DIMEGLUMINE 529 MG/ML IV SOLN  COMPARISON:  MRI head 05/21/2014  FINDINGS: SRS protocol at 3 Tesla performed.  Enhancing mass lesion in the left cerebellar tonsil measures 33 x 26 x 37 mm and is unchanged in size. The mass shows diffuse enhancement. There may be some minimal internal hemorrhage. The white matter edema has improved since the prior study due to steroid  treatment. There remains mass-effect on the fourth ventricle which is flattened and displaced slightly to the right. No hydrocephalus. The mass extends into the left posterior lateral medulla were there is some edema and enhancing tumor.  No other enhancing mass lesions identified.  Ventricle size is normal. Negative for acute infarct. No significant chronic ischemia.  Paranasal sinuses clear neck  The patient was not able to hold still and some of the images are degraded by motion  IMPRESSION: 33 x 26 x 27 mm left cerebellar tonsillar mass is unchanged in size. There is invasion of the posterior lateral medulla on the left. Surrounding white matter edema has improved following treatment with steroids. This appears to be a solitary metastatic deposit.   Electronically Signed  By: Franchot Gallo M.D.   On: 05/27/2014 19:28   Mr Jeri Cos FX Contrast  05/21/2014   CLINICAL DATA:  Dizziness with nausea and vomiting. Known brain mass.  EXAM: MRI HEAD WITHOUT AND WITH CONTRAST  TECHNIQUE: Multiplanar, multiecho pulse sequences of the brain and surrounding structures were obtained without and with intravenous contrast.  CONTRAST:  73m MULTIHANCE GADOBENATE DIMEGLUMINE 529 MG/ML IV SOLN  [facility]  FINDINGS: Calvarium and upper cervical spine: No focal marrow signal abnormality.  Orbits: No significant findings.  Sinuses: Retention cysts in the adenoid. No paranasal sinus or mastoid obstruction.  Brain: There is diffusely enhancing mass located in the lower left cerebellum with growth across the inferior cerebellar peduncle into the dorsal lateral medulla. The mass measures 32 x 25 x 37 mm. T2 hypo intensity and partial diffusion restriction suggests dense cellularity. There could be minimal blood products internally, but no measurable hematoma. No additional mass lesion is seen. Vasogenic edema surrounds the mass, with crowding in the posterior fossa and upper  displacement of the cerebellum. The lower fourth ventricle it is partially effaced, but there is no hydrocephalus.  No infarct, major vessel occlusion, for significant white matter disease.  IMPRESSION: 1. 32 x 25 x 37 mm densely cellular left cerebellar mass which extends into the left inferior peduncle and medulla. Given patient's right upper lobe mass, this is most consistent with a solitary metastasis. 2. Vasogenic edema partially effaces the lower fourth ventricle. No hydrocephalus.   Electronically Signed   By: JMonte FantasiaM.D.   On: 05/21/2014 12:04   Ct Abdomen Pelvis W Contrast  05/21/2014   CLINICAL DATA:  Cirrhosis, hepatitis C. Cerebellar mass. Lung mass on chest radiograph. Evaluate for metastatic disease.  EXAM: CT CHEST, ABDOMEN, AND PELVIS WITH CONTRAST  TECHNIQUE: Multidetector CT imaging of the chest, abdomen and pelvis was performed following the standard protocol during bolus administration of intravenous contrast.  CONTRAST:  1050mOMNIPAQUE IOHEXOL 300 MG/ML  SOLN  COMPARISON:  Chest radiograph dated 05/20/2014.  FINDINGS: CT CHEST FINDINGS  Mediastinum/Nodes: The heart is normal in size. No pericardial effusion.  Coronary atherosclerosis in the LAD.  9 mm short axis right hilar node (series 3/ image 23). Small right paratracheal nodes measuring up to 6 mm short axis (series 3/image 20). 7 mm short axis subcarinal node (series 3/ image 23).  No suspicious axillary or supraclavicular nodes.  Visualized thyroid is unremarkable.  Lungs/Pleura: Macrolobulated 6.6 x 7.7 x 6.4 cm mass in the posterior right upper lobe (series 4/image 14), which abuts the posterior chest wall/pleural surface, but does not demonstrate definite overlying osseous invasion.  No evidence of satellite nodularity or lymphangitic spread. Lungs are otherwise clear.  Mild dependent scarring/ atelectasis in the right lower lobe.  No pleural effusion or pneumothorax.  Musculoskeletal: Mild degenerative changes of the  thoracic spine.  CT ABDOMEN PELVIS FINDINGS  Hepatobiliary: Cirrhosis. Mildly increased subcapsular perfusion inferiorly in thea posterior segment right hepatic lobe (series 3/image 70), which normalizes on the delayed phase, without definite underlying mass.  Cholelithiasis (series 3/ image 66). Gallbladder is underdistended. No intrahepatic or extrahepatic ductal dilatation.  Pancreas: Within normal limits.  Spleen: Splenomegaly, measuring 15.9 cm in maximal craniocaudal dimension.  Adrenals/Urinary Tract: Adrenal glands are within normal limits.  Kidneys are within normal limits.  No hydronephrosis.  Layering excretory contrast (likely gadolinium) within the bladder.  Stomach/Bowel: Stomach is within normal limits.  No evidence of  bowel obstruction.  Normal appendix.  Colonic diverticulosis, without evidence of diverticulitis.  Vascular/Lymphatic: Atherosclerotic calcifications of the abdominal aorta and branch vessels.  Small upper abdominal lymph nodes measuring up to 10 mm short axis, likely reactive.  Reproductive: Prostate is unremarkable.  Other: Moderate fat containing left inguinal/ scrotal hernia.  Musculoskeletal: Degenerative changes of the lumbar spine.  IMPRESSION: 7.7 cm posterior right upper lobe mass which abuts the posterior chest wall/pleural surface, compatible with primary bronchogenic neoplasm.  9 mm short axis right hilar node, worrisome for nodal metastasis. Additional small right paratracheal and subcarinal nodes, indeterminate.  No evidence of metastatic disease in the abdomen/pelvis.  Additional ancillary findings as above.   Electronically Signed   By: Julian Hy M.D.   On: 05/21/2014 13:21   Ct Biopsy  05/23/2014   CLINICAL DATA:  Right upper lobe mass.  Intracranial lesion.  EXAM: CT-GUIDED BIOPSY OF A RIGHT UPPER LOBE MASS.  CORE.  MEDICATIONS AND MEDICAL HISTORY: Versed 2 mg, Fentanyl 100 mcg.  Additional Medications: None.  ANESTHESIA/SEDATION: Moderate sedation time: 10  minutes  PROCEDURE: The procedure, risks, benefits, and alternatives were explained to the patient. Questions regarding the procedure were encouraged and answered. The patient understands and consents to the procedure.  The right upper posterior thorax was prepped with Betadine in a sterile fashion, and a sterile drape was applied covering the operative field. A sterile gown and sterile gloves were used for the procedure.  Under CT guidance, a(n) 17 gauge guide needle was advanced into the right upper lobe lung mass. Subsequently 3 18 gauge core biopsies were obtained. The guide needle was removed. Final imaging was performed.  Patient tolerated the procedure well without complication. Vital sign monitoring by nursing staff during the procedure will continue as patient is in the special procedures unit for post procedure observation.  FINDINGS: The images document guide needle placement within the right upper lobe lung mass. Post biopsy images demonstrate no pneumothorax.  COMPLICATIONS: None  IMPRESSION: Successful CT-guided right upper lobe lung mass core biopsy.   Electronically Signed   By: Marybelle Killings M.D.   On: 05/23/2014 14:13   Dg Chest Port 1 View  05/23/2014   CLINICAL DATA:  Evaluate for pneumothorax after lung biopsy  EXAM: PORTABLE CHEST - 1 VIEW  COMPARISON:  None.  FINDINGS: The heart size and mediastinal contours are within normal limits. Right upper lobe lung mass is again noted. No pneumothorax identified. The visualized skeletal structures are unremarkable.  IMPRESSION: 1. No pneumothorax noted following lung biopsy.   Electronically Signed   By: Kerby Moors M.D.   On: 05/23/2014 14:47    CBC  Recent Labs Lab 05/24/14 0458 05/25/14 0805 05/26/14 0710 05/27/14 0635 05/28/14 0739  WBC 11.6* 12.6* 13.3* 13.8* 10.4  HGB 15.0 15.9 16.4 16.4 16.0  HCT 41.8 43.7 45.5 45.5 45.2  PLT 136* 163 154 153 132*  MCV 89.1 88.6 88.7 89.9 90.6  MCH 32.0 32.3 32.0 32.4 32.1  MCHC 35.9 36.4*  36.0 36.0 35.4  RDW 12.9 13.0 12.9 13.4 13.6  LYMPHSABS 2.2 2.2 2.5 2.5 1.4  MONOABS 0.7 0.4 0.9 1.0 0.9  EOSABS 0.0 0.0 0.0 0.0 0.0  BASOSABS 0.0 0.0 0.0 0.0 0.0    Chemistries   Recent Labs Lab 05/22/14 0458 05/23/14 0431 05/24/14 0458 05/25/14 0805  NA 136 136 134* 133*  K 4.8 4.3 4.6 3.8  CL 101 102 100 96*  CO2 '25 25 27 26  '$ GLUCOSE 138* 204* 175*  268*  BUN '11 12 16 18  '$ CREATININE 0.79 0.67 0.70 0.68  CALCIUM 9.4 9.2 9.3 9.5  AST 67* 49* 56* 56*  ALT 139* 117* 125* 132*  ALKPHOS 59 57 61 60  BILITOT 1.3* 0.5 0.8 1.2   ------------------------------------------------------------------------------------------------------------------ estimated creatinine clearance is 118.6 mL/min (by C-G formula based on Cr of 0.68). ------------------------------------------------------------------------------------------------------------------ No results for input(s): HGBA1C in the last 72 hours. ------------------------------------------------------------------------------------------------------------------ No results for input(s): CHOL, HDL, LDLCALC, TRIG, CHOLHDL, LDLDIRECT in the last 72 hours. ------------------------------------------------------------------------------------------------------------------ No results for input(s): TSH, T4TOTAL, T3FREE, THYROIDAB in the last 72 hours.  Invalid input(s): FREET3 ------------------------------------------------------------------------------------------------------------------ No results for input(s): VITAMINB12, FOLATE, FERRITIN, TIBC, IRON, RETICCTPCT in the last 72 hours.  Coagulation profile  Recent Labs Lab 05/22/14 1214  INR 0.97    No results for input(s): DDIMER in the last 72 hours.  Cardiac Enzymes No results for input(s): CKMB, TROPONINI, MYOGLOBIN in the last 168 hours.  Invalid input(s):  CK ------------------------------------------------------------------------------------------------------------------ Invalid input(s): POCBNP    Ashelyn Mccravy D.O. on 05/28/2014 at 12:33 PM  Between 7am to 7pm - Pager - [phone]  After 7pm go to [email or web address] - password [email or web address]  And look for the night coverage person covering for me after hours  [facility] Office  [phone]

## 2014-05-28 NOTE — Consult Note (Signed)
Ray Garcia  Telephone:(336) [place] NOTE  Ray Garcia                                MR#: [id number]  DOB: September 30, 1960                       CSN#: [id number]  Referring MD: Dr. 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 male transferred from [facility] On 05/21/2014 after presenting with 2 weeks history of severe nausea and vomiting, accompanied by vertigo and gait instability (his gait abnormality was progressive, over 1 year period). He has also been experiencing posterior right headaches, and double vision. Denies fevers, chills, night sweats or mucositis. Denies any hemoptysis. Denies any chest pain or palpitations. Denies lower extremity swelling. He denies any abdominal pain. Appetite is normal. Denies any dysuria. Denies abnormal skin rashes, or neuropathy. Denies any bleeding issues such as epistaxis, hematemesis, hematuria or hematochezia. Denies seizures, bladder or bowel incontinence. No confusion is reported.  Per chart notes, a head CT outside of [place] Showed a large left-cerebellar  Mass. MRI of the brain at this hospital, on 05/21/2014 confirmed a 32 x 25 x 37 mm diffusely enhancing mass in the lower left cerebellum With extension across the inferior cerebellar peduncle into the dorsal lateral medulla. CT of the chest, abdomen and pelvis On 05/21/2014 was remarkable for a  6.4 x 6.6 x 7.7 cm posterior right upper lobe mass which abuts the posterior chest wall/pleural surface, compatible with primary bronchogenic neoplasm. 9 mm short axis right hilar node, worrisome for nodal metastasis and small right paratracheal and subcarinal nodes, indeterminate were seen. No evidence of metastatic disease in the abdomen/pelvis  The patient denies any  prior history of cancer, but he does have significant family history of cancer, although he cannot be more specific. He has a long history of tobacco abuse, having discontinued about 2 months ago, he also has a history of alcohol abuse, but denies drug use. 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 He also reports a history of "two lung spots, one in the right and one in the left", initially diagnosed in the 1980s while living out of state in [facility], following the use of a new type of glue utilized on constructions. At that time, he was informed that this could be carcinogenic. During this admission, the patient was placed on IV Decadron, with some improvement of his symptoms. A CT-guided biopsy of the right upper lobe mass on 05/14/1914, was remarkable for invasive adenocarcinoma, lung primary.  Radiation oncology evaluated the patient, and given the extent of edema and location of the tumor, he would be at risk of obstruction of the fourth ventricle with any treatment related edema. Suboccipital craniectomy with tumor resection was recommended for cytoreduction and local control. He is scheduled for preoperative SRS on Friday, May 6, with preop Three-dimensional MRI of the brain with and without gadolinium, and then with planned surgical resection next week. We were kindly requested to see this patient with recommendations.        PMH:  Diagnosis  .  ETOH abuse  . Dental caries  . Cirrhosis of liver  . Hepatitis C  . Arthritis    Surgeries:  History reviewed. No pertinent past surgical history.  Allergies:  Allergies  Allergen Reactions  . Amoxicillin Rash  . Ibuprofen Other (See Comments)    Pt stated ibuprofen made him pass out  . Penicillins Nausea And Vomiting    And a rash    Medications:   Scheduled Meds: . clindamycin  300 mg Oral 3 times per day  . dexamethasone  4 mg Intravenous 3 times per day  .  docusate sodium  100 mg Oral BID  . feeding supplement (ENSURE ENLIVE)  237 mL Oral BID BM  . folic acid  1 mg Oral Daily  . heparin  5,000 Units Subcutaneous 3 times per day  . multivitamin with minerals  1 tablet Oral Daily  . polyethylene glycol  17 g Oral Daily  . saccharomyces boulardii  250 mg Oral BID  . thiamine  100 mg Oral Daily   Continuous Infusions:  PRN Meds:.morphine injection, ondansetron **OR** ondansetron (ZOFRAN) IV  HUD:JSHFWYOV injection  ROS: Constitutional: Denies fevers, chills or abnormal night sweats Eyes: Denies blurriness of vision, He reports double vision, denies watery eyes Ears, nose, mouth, throat, and face: Denies mucositis or sore throat. His been experiencing voice hoarseness, due to forceful vomiting Respiratory: He has dry, nonbloody cough, dyspnea and had wheezes on admission Cardiovascular: Denies palpitations, chest discomfort or lower extremity swelling Gastrointestinal: Reports nausea and forceful vomiting, denies heartburn or change in bowel habits Skin: Denies abnormal skin rashes but has skin exposure Lymphatics: Denies new lymphadenopathy or easy bruising Neurological:Denies numbness, tingling. He has gait instability for about 1 year.  Behavioral/Psych: Mood is stable, no new changes  All other systems were reviewed with the patient and are negative.   Family History:   His mother was adopted, father had bladder versus prostate cancer. Many family members with cancer of unknown type.    Social History: He lives in [place]. He is married. 2 sons were murdered, 1 daughter alive and well In [place]. 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 He smokes about 1 pack a day of cigarettes for at least 30 years, having quit couple of months ago. He also drank significant amount of alcohol In the past, now up to 2 beers a day. He denies any recreational drug use.  Physical Exam   ECOG PERFORMANCE STATUS: 3  Filed Vitals:    05/28/14 1036  BP: 144/83  Pulse: 63  Temp: 97.7 F (36.5 C)  Resp: 19   Filed Weights   05/20/14 2200  Weight: 204 lb 9.4 oz (92.8 kg)    GENERAL:alert, no distress and anxious appearing SKIN: skin color, texture His sun exposed, turgor are normal, no rashes or significant lesions. Multiple tattoos EYES: normal, conjunctiva are pink and non-injected, sclera clear OROPHARYNX:no exudate, no erythema and lips, buccal mucosa, and tongue normal. Poor dentition noted NECK: supple, thyroid normal size, non-tender, without nodularity LYMPH:  no palpable lymphadenopathy in the cervical, axillary or inguinal LUNGS: Essentially clear to auscultation and percussion with normal breathing effort HEART: regular rate & rhythm and no murmurs and no lower extremity edema ABDOMEN:abdomen soft, non-tender and normal bowel sounds. Mild liver enlargement Musculoskeletal:no cyanosis of digits and mild clubbing  PSYCH: alert & oriented x 3 with fluent speech NEURO:He has mild inner deviation of the left eye. He also has gait instability, not tested.  Rest of the exam with no other motor or sensory deficits  CBC  Recent Labs Lab 05/24/14 0458 05/25/14 0805 05/26/14 0710 05/27/14 0635 05/28/14 0739  WBC 11.6* 12.6* 13.3* 13.8* 10.4  HGB 15.0 15.9 16.4 16.4 16.0  HCT 41.8 43.7 45.5 45.5 45.2  PLT 136* 163 154 153 132*  MCV 89.1 88.6 88.7 89.9 90.6  MCH 32.0 32.3 32.0 32.4 32.1  MCHC 35.9 36.4* 36.0 36.0 35.4  RDW 12.9 13.0 12.9 13.4 13.6  LYMPHSABS 2.2 2.2 2.5 2.5 1.4  MONOABS 0.7 0.4 0.9 1.0 0.9  EOSABS 0.0 0.0 0.0 0.0 0.0  BASOSABS 0.0 0.0 0.0 0.0 0.0    Anemia panel:  No results for input(s): VITAMINB12, FOLATE, FERRITIN, TIBC, IRON, RETICCTPCT in the last 72 hours.  CMP    Recent Labs Lab 05/22/14 0458 05/23/14 0431 05/24/14 0458 05/25/14 0805  NA 136 136 134* 133*  K 4.8 4.3 4.6 3.8  CL 101 102 100 96*  CO2 '25 25 27 26  '$ GLUCOSE 138* 204* 175* 268*  BUN '11 12 16 18   '$ CREATININE 0.79 0.67 0.70 0.68  CALCIUM 9.4 9.2 9.3 9.5  AST 67* 49* 56* 56*  ALT 139* 117* 125* 132*  ALKPHOS 59 57 61 60  BILITOT 1.3* 0.5 0.8 1.2        Component Value Date/Time   BILITOT 1.2 05/25/2014 0805      Recent Labs Lab 05/22/14 1214  INR 0.97    No results for input(s): DDIMER in the last 72 hours.  Imaging Studies: I have reviewed all the scans  Ct Chest W Contrast  05/21/2014    COMPARISON:  Chest radiograph dated 05/20/2014.  FINDINGS: CT CHEST FINDINGS  Mediastinum/Nodes: The heart is normal in size. No pericardial effusion.  Coronary atherosclerosis in the LAD.  9 mm short axis right hilar node (series 3/ image 23). Small right paratracheal nodes measuring up to 6 mm short axis (series 3/image 20). 7 mm short axis subcarinal node (series 3/ image 23).  No suspicious axillary or supraclavicular nodes.  Visualized thyroid is unremarkable.  Lungs/Pleura: Macrolobulated 6.6 x 7.7 x 6.4 cm mass in the posterior right upper lobe (series 4/image 14), which abuts the posterior chest wall/pleural surface, but does not demonstrate definite overlying osseous invasion.  No evidence of satellite nodularity or lymphangitic spread. Lungs are otherwise clear.  Mild dependent scarring/ atelectasis in the right lower lobe.  No pleural effusion or pneumothorax.  Musculoskeletal: Mild degenerative changes of the thoracic spine.  IMPRESSION: 7.7 cm posterior right upper lobe mass which abuts the posterior chest wall/pleural surface, compatible with primary bronchogenic neoplasm.  9 mm short axis right hilar node, worrisome for nodal metastasis. Additional small right paratracheal and subcarinal nodes, indeterminate.  No evidence of metastatic disease in the abdomen/pelvis.  Additional ancillary findings as above.   Electronically Signed   By: Julian Hy M.D.   On: 05/21/2014 13:21   Mr Jeri Cos UE Contrast  05/27/2014    MRI head 05/21/2014  FINDINGS: SRS protocol at 3 [facility]  performed.  Enhancing mass lesion in the left cerebellar tonsil measures 33 x 26 x 37 mm and is unchanged in size. The mass shows diffuse enhancement. There may be some minimal internal hemorrhage. The white matter edema has improved since the prior study due to steroid treatment. There remains mass-effect on the fourth ventricle which is flattened and displaced slightly to the right. No hydrocephalus. The mass extends into the left posterior lateral medulla  were there is some edema and enhancing tumor.  No other enhancing mass lesions identified.  Ventricle size is normal. Negative for acute infarct. No significant chronic ischemia.  Paranasal sinuses clear neck  The patient was not able to hold still and some of the images are degraded by motion  IMPRESSION: 33 x 26 x 27 mm left cerebellar tonsillar mass is unchanged in size. There is invasion of the posterior lateral medulla on the left. Surrounding white matter edema has improved following treatment with steroids. This appears to be a solitary metastatic deposit.   Electronically Signed   By: Franchot Gallo M.D.   On: 05/27/2014 19:28   Mr Jeri Cos ZO Contrast  05/21/2014   COMPARISON:  Head CT from [facility] yesterday at 1713 hours  FINDINGS: Calvarium and upper cervical spine: No focal marrow signal abnormality.  Orbits: No significant findings.  Sinuses: Retention cysts in the adenoid. No paranasal sinus or mastoid obstruction.  Brain: There is diffusely enhancing mass located in the lower left cerebellum with growth across the inferior cerebellar peduncle into the dorsal lateral medulla. The mass measures 32 x 25 x 37 mm. T2 hypo intensity and partial diffusion restriction suggests dense cellularity. There could be minimal blood products internally, but no measurable hematoma. No additional mass lesion is seen. Vasogenic edema surrounds the mass, with crowding in the posterior fossa and upper displacement of the cerebellum. The lower fourth  ventricle it is partially effaced, but there is no hydrocephalus.  No infarct, major vessel occlusion, for significant white matter disease.  IMPRESSION: 1. 32 x 25 x 37 mm densely cellular left cerebellar mass which extends into the left inferior peduncle and medulla. Given patient's right upper lobe mass, this is most consistent with a solitary metastasis. 2. Vasogenic edema partially effaces the lower fourth ventricle. No hydrocephalus.   Electronically Signed   By: Monte Fantasia M.D.   On: 05/21/2014 12:04   Ct Abdomen Pelvis W Contrast  05/21/2014    CT ABDOMEN PELVIS FINDINGS  Hepatobiliary: Cirrhosis. Mildly increased subcapsular perfusion inferiorly in thea posterior segment right hepatic lobe (series 3/image 70), which normalizes on the delayed phase, without definite underlying mass.  Cholelithiasis (series 3/ image 66). Gallbladder is underdistended. No intrahepatic or extrahepatic ductal dilatation.  Pancreas: Within normal limits.  Spleen: Splenomegaly, measuring 15.9 cm in maximal craniocaudal dimension.  Adrenals/Urinary Tract: Adrenal glands are within normal limits.  Kidneys are within normal limits.  No hydronephrosis.  Layering excretory contrast (likely gadolinium) within the bladder.  Stomach/Bowel: Stomach is within normal limits.  No evidence of bowel obstruction.  Normal appendix.  Colonic diverticulosis, without evidence of diverticulitis.  Vascular/Lymphatic: Atherosclerotic calcifications of the abdominal aorta and branch vessels.  Small upper abdominal lymph nodes measuring up to 10 mm short axis, likely reactive.  Reproductive: Prostate is unremarkable.  Other: Moderate fat containing left inguinal/ scrotal hernia.  Musculoskeletal: Degenerative changes of the lumbar spine.  IMPRESSION: 7.7 cm posterior right upper lobe mass which abuts the posterior chest wall/pleural surface, compatible with primary bronchogenic neoplasm.  9 mm short axis right hilar node, worrisome for nodal  metastasis. Additional small right paratracheal and subcarinal nodes, indeterminate.  No evidence of metastatic disease in the abdomen/pelvis.  Additional ancillary findings as above.   Electronically Signed   By: Julian Hy M.D.   On: 05/21/2014 13:21   Ct Biopsy  05/23/2014    IMPRESSION: Successful CT-guided right upper lobe lung mass core biopsy.   Electronically Signed  By: Marybelle Killings M.D.   On: 05/23/2014 14:13   Diagnosis Lung, needle/core biopsy(ies), Right upper lobe - INVASIVE ADENOCARCINOMA, SEE COMMENT. Microscopic Comment The carcinoma demonstrates the following immunophenotype: TTF-1 - patchy moderate strong expression. CK 5/6 - negative expression. Cytokeratin 7 - strong diffuse expression. Overall the morphology and immunophenotype are that of invasive adenocarcinoma, primary to lung. Ray Garcia RUND DO Pathologist, Electronic Signature  Assessment/Plan: 54 y.o.   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 CT of the chest, abdomen and pelvis On 05/21/2014 was remarkable for a  6.4 x 6.6 x 7.7 cm posterior right upper lobe mass which abuts the posterior chest wall/pleural surface, compatible with primary bronchogenic neoplasm. 9 mm short axis right hilar node, worrisome for nodal metastasis and small right paratracheal and subcarinal nodes, indeterminate were seen.  No evidence of metastatic disease in the abdomen/pelvis He was placed on IV Decadron, with some improvement of his symptoms. A CT-guided biopsy of the right upper lobe mass on 05/14/1914, was remarkable for invasive adenocarcinoma, Lung primary. Suboccipital craniectomy with tumor resection is scheduled for Friday, May 6, with preop Three-dimensional MRI of the brain with  and without gadolinium, and then with planned surgical resection next week. Radiation Oncology is involved I'll arrange for him patient PET/CT scan, blood work and blood test for Guardant 360 for further delineation of mutation studies on the subtype of adenocarcinoma. I have tentatively arranged for PET/CT scan on 06/11/2014 followed by follow-up visit in my office on 06/12/2014.  Anemia in neoplastic disease No transfusion is indicated at this time Monitor counts closely Transfuse blood to maintain a Hb of 8 g or if the patient is acutely bleeding  Thrombocytopenia This is due to malignancy, Hepatitis C and cirrhosis Monitor counts closely No bleeding issues are noted. No transfusion is indicated at this time Transfuse 1 unit of platelets if count is less or equal than 10,000 or 20,000 if the patient is acutely bleeding Hold heparin if  platelets drop to less than 50,000  Leukocytosis This is due to Decadron This has normalized No intervention is indicated at this time Will continue to monitor  Full Code  Nausea and Vomiting Gait instability Due to Cerebellar involvement This is improving with IV steroids  DVT prophylaxis On Heparin  Other medical issues as per admitting team   I will follow next week. Outpatient follow-up is arranged. I agree with local therapy to the brain right now and I will arrange for systemic chemotherapy to be administered as an outpatient. This do not hesitate to call if questions arise.  Rondel Jumbo, PA-C 05/28/2014 11:41 AM Laron Angelini, MD 05/28/2014

## 2014-05-28 NOTE — Progress Notes (Signed)
[facility] with Manus Gunning, RN caring for patient today. Obtained report from her and informed her of patient's upcoming simulation. She denies the patient is on contact precautions and confirms he has a patent IV. Phoned Doug @ [facility]. Arrangements made for [facility] to have the patient at [facility] 5/5 @ 1100 for simulation and 5/9 @ 1210 for [facility] treatment.

## 2014-05-29 ENCOUNTER — Ambulatory Visit
Admit: 2014-05-29 | Discharge: 2014-05-29 | Disposition: A | Discharge status: Home / Self Care | Payer: Self-pay | Attending: Radiation Oncology | Admitting: Radiation Oncology

## 2014-05-29 ENCOUNTER — Telehealth: Payer: Self-pay | Admitting: Hematology and Oncology

## 2014-05-29 DIAGNOSIS — C7931 Secondary malignant neoplasm of brain: Secondary | ICD-10-CM

## 2014-05-29 LAB — CBC WITH DIFFERENTIAL/PLATELET
BASOS ABS: 0 10*3/uL (ref 0.0–0.1)
Basophils Relative: 0 % (ref 0–1)
EOS PCT: 0 % (ref 0–5)
Eosinophils Absolute: 0 10*3/uL (ref 0.0–0.7)
HCT: 46.3 % (ref 39.0–52.0)
Hemoglobin: 16.6 g/dL (ref 13.0–17.0)
Lymphocytes Relative: 16 % (ref 12–46)
Lymphs Abs: 1.9 10*3/uL (ref 0.7–4.0)
MCH: 32.4 pg (ref 26.0–34.0)
MCHC: 35.9 g/dL (ref 30.0–36.0)
MCV: 90.3 fL (ref 78.0–100.0)
Monocytes Absolute: 0.8 10*3/uL (ref 0.1–1.0)
Monocytes Relative: 7 % (ref 3–12)
Neutro Abs: 9 10*3/uL — ABNORMAL HIGH (ref 1.7–7.7)
Neutrophils Relative %: 77 % (ref 43–77)
Platelets: 123 10*3/uL — ABNORMAL LOW (ref 150–400)
RBC: 5.13 MIL/uL (ref 4.22–5.81)
RDW: 13.4 % (ref 11.5–15.5)
WBC: 11.7 10*3/uL — ABNORMAL HIGH (ref 4.0–10.5)

## 2014-05-29 NOTE — Progress Notes (Addendum)
I have spoken with Dr. Tammi Klippel from [facility] and reviewed the patient's results thus far. We are planning on SRS scheduled Mon afternoon, followed by surgical resection on Tuesday morning. This plan has been discussed with the patient.

## 2014-05-29 NOTE — Progress Notes (Signed)
[facility]                                                                              Patient Demographics  Ray Garcia, is a 54 y.o. male, DOB - 1960-04-21, LHT:[id number]  Admit date - 05/20/2014   Admitting Physician Lavina Hamman, MD  Outpatient Primary MD for the patient is [name], [name], MD  LOS - 9   No chief complaint on file.     HPI on 05/20/2014 by Dr. Shanda Howells This is a 54 y.o. year old male with significant past medical history of cirrhosis, hepatitis C, HTN, dental caries/abscess, ETOH abuse presenting with cerebellar mass. Patient presented today at [facility] with chief complaint of recurrent intermittent headaches as well as recurrent nausea and dizziness. States that symptoms have been fairly persistent for over the past 2 weeks. No reported head trauma. Reports only minimal alcohol intake. One to 2 beers per day. Previous heavy drinker. No hemiparesis or confusion. Also with noted dental caries and dental abscess. Is currently on oral penicillin for this. Presents his [facility] afebrile, hemodynamically stable. A head CT obtained that [facility] showed abnormal appearance of the left cerebellar hemisphere concerning for underlying mass lesion with associated edema. Per report, ER physician assistant discussed case with on-call neurosurgery at [facility] recommended medical admission with formal neurosurgical consultation. Labs are pending.   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 CT scan of lungs showing a 7.7 cm posterior right upper lobe mass, making this cerebellar mass lesion suspicious for metastasis -Radiation Onc and neurosurgery consulted consulted.  -Continue IV steroids- per neurosurgery, Dr. Kathyrn Sheriff, if non-small cell CA, may require resection of the cerebellar lesion  (note on 05/22/2014) -Spoke with Dr, Kathyrn Sheriff (on 05/27/2014) regarding results of biopsy (invavsive adenocarcinoma), and patient will likely need surgery with stereotactic radiation therapy/resection on ?06/02/2014 -Oncology, Dr. Alvy Bimler, consulted and appreciated, will follow up with patient on 5/19 as an outpatient  Probable metastatic lung cancer -Patient has a 30-pack-year smoking history, CT chest showed 7.7 cm posterior right upper lobe mass compatible with primary bronchogenic neoplasm. -Patient underwent CT-guided biopsy 4/29, Invasive adenocarcinoma primary to lung -Treatment and plan as above  Nausea/vomiting. -Resolved -As mentioned above likely secondary to cerebellar mass lesion  Liver cirrhosis: Likely due to hepatitis C and chronic alcoholism -CT abdomen and pelvis showed no evidence of metastatic disease in the abdomen and pelvis.   Chronic alcoholism. -Patient reported that his last drink about a month ago, currently does not exhibit signs or symptoms of alcohol withdrawal  Essential hypertension  -Currently stable  Constipation -Resolved, continue colace  Leukocytosis -Improving, Likely secondary to decadron -Continue to monitor  Dental caries/abscess -Was on PCN as an outpatient -Currently on clindamycin, will discontinue antibiotics  Code Status: Full  Family Communication: None at bedside. Spoke with his wife on 05/28/2014.  Disposition Plan: Admitted, pending further workup and resection on 5/9?   Time Spent in minutes   30 minutes  Procedures  CT guided biopsy- by IR  Consults  Pulmonology Interventional radiology Neurosurgery Oncology  DVT Prophylaxis  Heparin  Lab Results  Component Value Date   PLT 123* 05/29/2014    Medications  Scheduled Meds: . clindamycin  300 mg Oral 3 times per day  . dexamethasone  4 mg Intravenous 3 times per day  . docusate sodium  100 mg Oral BID  . feeding supplement (ENSURE ENLIVE)  237 mL Oral BID BM    . folic acid  1 mg Oral Daily  . heparin  5,000 Units Subcutaneous 3 times per day  . multivitamin with minerals  1 tablet Oral Daily  . polyethylene glycol  17 g Oral Daily  . saccharomyces boulardii  250 mg Oral BID  . thiamine  100 mg Oral Daily   Continuous Infusions:  PRN Meds:.morphine injection, ondansetron **OR** ondansetron (ZOFRAN) IV  Antibiotics    Anti-infectives    Start     Dose/Rate Route Frequency Ordered Stop   05/22/14 0015  clindamycin (CLEOCIN) capsule 300 mg     300 mg Oral 3 times per day 05/21/14 2359     05/21/14 0645  penicillin v potassium (VEETID) tablet 500 mg  Status:  Discontinued     500 mg Oral 4 times per day 05/21/14 2376 05/21/14 2358      Subjective:   [name] seen and examined today.  Patient denies pain, chest pain, shortness of breath, abdominal pain, nausea, vomiting constipation or diarrhea. Patient states his dizziness has improved. He is wondering what his surgery will be. Patient is very thankful for the care and treatment he is receiving.  Objective:   Filed Vitals:   05/28/14 2144 05/29/14 0136 05/29/14 0550 05/29/14 0932  BP: 147/90 142/82 141/90 141/85  Pulse: 70 58 67 71  Temp: 98 F (36.7 C) 97.8 F (36.6 C) 98.1 F (36.7 C) 98.1 F (36.7 C)  TempSrc: Oral Oral Oral Oral  Resp: '19 18 19 20  '$ Height:      Weight:      SpO2: 97% 95% 98% 96%    Wt Readings from Last 3 Encounters:  05/20/14 92.8 kg (204 lb 9.4 oz)     Intake/Output Summary (Last 24 hours) at 05/29/14 1221 Last data filed at 05/29/14 1100  Gross per 24 hour  Intake      0 ml  Output   2075 ml  Net  -2075 ml    Exam  General: Well developed, well nourished,  No distress  Cardiovascular: Normal S1/S2, RRR, no murmurs  Respiratory: Clear to auscultation   Abdomen: Soft, nontender, nondistended, + bowel sounds  Extremities: warm dry without cyanosis clubbing or edema  Neuro: AAOx3, nonfocal  Psych: Appropriate mood and affect,  pleasant  Data Review   Micro Results No results found for this or any previous visit (from the past 240 hour(s)).  Radiology Reports Ct Chest W Contrast  05/21/2014   CLINICAL DATA:  Cirrhosis, hepatitis C. Cerebellar mass. Lung mass on chest radiograph. Evaluate for metastatic disease.  EXAM: CT CHEST, ABDOMEN, AND PELVIS WITH CONTRAST  TECHNIQUE: Multidetector CT imaging of the chest, abdomen and pelvis was performed following the standard protocol during bolus administration of intravenous contrast.  CONTRAST:  159m OMNIPAQUE IOHEXOL 300 MG/ML  SOLN  COMPARISON:  Chest radiograph dated 05/20/2014.  FINDINGS: CT CHEST FINDINGS  Mediastinum/Nodes: The heart is normal in size. No pericardial effusion.  Coronary atherosclerosis in the LAD.  9 mm short axis right hilar node (series 3/ image 23). Small right paratracheal nodes  measuring up to 6 mm short axis (series 3/image 20). 7 mm short axis subcarinal node (series 3/ image 23).  No suspicious axillary or supraclavicular nodes.  Visualized thyroid is unremarkable.  Lungs/Pleura: Macrolobulated 6.6 x 7.7 x 6.4 cm mass in the posterior right upper lobe (series 4/image 14), which abuts the posterior chest wall/pleural surface, but does not demonstrate definite overlying osseous invasion.  No evidence of satellite nodularity or lymphangitic spread. Lungs are otherwise clear.  Mild dependent scarring/ atelectasis in the right lower lobe.  No pleural effusion or pneumothorax.  Musculoskeletal: Mild degenerative changes of the thoracic spine.  CT ABDOMEN PELVIS FINDINGS  Hepatobiliary: Cirrhosis. Mildly increased subcapsular perfusion inferiorly in thea posterior segment right hepatic lobe (series 3/image 70), which normalizes on the delayed phase, without definite underlying mass.  Cholelithiasis (series 3/ image 66). Gallbladder is underdistended. No intrahepatic or extrahepatic ductal dilatation.  Pancreas: Within normal limits.  Spleen: Splenomegaly,  measuring 15.9 cm in maximal craniocaudal dimension.  Adrenals/Urinary Tract: Adrenal glands are within normal limits.  Kidneys are within normal limits.  No hydronephrosis.  Layering excretory contrast (likely gadolinium) within the bladder.  Stomach/Bowel: Stomach is within normal limits.  No evidence of bowel obstruction.  Normal appendix.  Colonic diverticulosis, without evidence of diverticulitis.  Vascular/Lymphatic: Atherosclerotic calcifications of the abdominal aorta and branch vessels.  Small upper abdominal lymph nodes measuring up to 10 mm short axis, likely reactive.  Reproductive: Prostate is unremarkable.  Other: Moderate fat containing left inguinal/ scrotal hernia.  Musculoskeletal: Degenerative changes of the lumbar spine.  IMPRESSION: 7.7 cm posterior right upper lobe mass which abuts the posterior chest wall/pleural surface, compatible with primary bronchogenic neoplasm.  9 mm short axis right hilar node, worrisome for nodal metastasis. Additional small right paratracheal and subcarinal nodes, indeterminate.  No evidence of metastatic disease in the abdomen/pelvis.  Additional ancillary findings as above.   Electronically Signed   By: Julian Hy M.D.   On: 05/21/2014 13:21   Mr Jeri Cos WJ Contrast  05/27/2014   CLINICAL DATA:  Metastatic disease.  Lung mass.  EXAM: MRI HEAD WITHOUT AND WITH CONTRAST  TECHNIQUE: Multiplanar, multiecho pulse sequences of the brain and surrounding structures were obtained without and with intravenous contrast.  CONTRAST:  64m MULTIHANCE GADOBENATE DIMEGLUMINE 529 MG/ML IV SOLN  COMPARISON:  MRI head 05/21/2014  FINDINGS: SRS protocol at 3 Tesla performed.  Enhancing mass lesion in the left cerebellar tonsil measures 33 x 26 x 37 mm and is unchanged in size. The mass shows diffuse enhancement. There may be some minimal internal hemorrhage. The white matter edema has improved since the prior study due to steroid treatment. There remains mass-effect on the  fourth ventricle which is flattened and displaced slightly to the right. No hydrocephalus. The mass extends into the left posterior lateral medulla were there is some edema and enhancing tumor.  No other enhancing mass lesions identified.  Ventricle size is normal. Negative for acute infarct. No significant chronic ischemia.  Paranasal sinuses clear neck  The patient was not able to hold still and some of the images are degraded by motion  IMPRESSION: 33 x 26 x 27 mm left cerebellar tonsillar mass is unchanged in size. There is invasion of the posterior lateral medulla on the left. Surrounding white matter edema has improved following treatment with steroids. This appears to be a solitary metastatic deposit.   Electronically Signed   By: CFranchot GalloM.D.   On: 05/27/2014 19:28   Mr Brain  W Wo Contrast  05/21/2014   CLINICAL DATA:  Dizziness with nausea and vomiting. Known brain mass.  EXAM: MRI HEAD WITHOUT AND WITH CONTRAST  TECHNIQUE: Multiplanar, multiecho pulse sequences of the brain and surrounding structures were obtained without and with intravenous contrast.  CONTRAST:  22m MULTIHANCE GADOBENATE DIMEGLUMINE 529 MG/ML IV SOLN  COM[facility]  [facility]  FINDINGS: Calvarium and upper cervical spine: No focal marrow signal abnormality.  Orbits: No significant findings.  Sinuses: Retention cysts in the adenoid. No paranasal sinus or mastoid obstruction.  Brain: There is diffusely enhancing mass located in the lower left cerebellum with growth across the inferior cerebellar peduncle into the dorsal lateral medulla. The mass measures 32 x 25 x 37 mm. T2 hypo intensity and partial diffusion restriction suggests dense cellularity. There could be minimal blood products internally, but no measurable hematoma. No additional mass lesion is seen. Vasogenic edema surrounds the mass, with crowding in the posterior fossa and upper displacement of the cerebellum. The lower  fourth ventricle it is partially effaced, but there is no hydrocephalus.  No infarct, major vessel occlusion, for significant white matter disease.  IMPRESSION: 1. 32 x 25 x 37 mm densely cellular left cerebellar mass which extends into the left inferior peduncle and medulla. Given patient's right upper lobe mass, this is most consistent with a solitary metastasis. 2. Vasogenic edema partially effaces the lower fourth ventricle. No hydrocephalus.   Electronically Signed   By: JMonte FantasiaM.D.   On: 05/21/2014 12:04   Ct Abdomen Pelvis W Contrast  05/21/2014   CLINICAL DATA:  Cirrhosis, hepatitis C. Cerebellar mass. Lung mass on chest radiograph. Evaluate for metastatic disease.  EXAM: CT CHEST, ABDOMEN, AND PELVIS WITH CONTRAST  TECHNIQUE: Multidetector CT imaging of the chest, abdomen and pelvis was performed following the standard protocol during bolus administration of intravenous contrast.  CONTRAST:  10106mOMNIPAQUE IOHEXOL 300 MG/ML  SOLN  COMPARISON:  Chest radiograph dated 05/20/2014.  FINDINGS: CT CHEST FINDINGS  Mediastinum/Nodes: The heart is normal in size. No pericardial effusion.  Coronary atherosclerosis in the LAD.  9 mm short axis right hilar node (series 3/ image 23). Small right paratracheal nodes measuring up to 6 mm short axis (series 3/image 20). 7 mm short axis subcarinal node (series 3/ image 23).  No suspicious axillary or supraclavicular nodes.  Visualized thyroid is unremarkable.  Lungs/Pleura: Macrolobulated 6.6 x 7.7 x 6.4 cm mass in the posterior right upper lobe (series 4/image 14), which abuts the posterior chest wall/pleural surface, but does not demonstrate definite overlying osseous invasion.  No evidence of satellite nodularity or lymphangitic spread. Lungs are otherwise clear.  Mild dependent scarring/ atelectasis in the right lower lobe.  No pleural effusion or pneumothorax.  Musculoskeletal: Mild degenerative changes of the thoracic spine.  CT ABDOMEN PELVIS FINDINGS   Hepatobiliary: Cirrhosis. Mildly increased subcapsular perfusion inferiorly in thea posterior segment right hepatic lobe (series 3/image 70), which normalizes on the delayed phase, without definite underlying mass.  Cholelithiasis (series 3/ image 66). Gallbladder is underdistended. No intrahepatic or extrahepatic ductal dilatation.  Pancreas: Within normal limits.  Spleen: Splenomegaly, measuring 15.9 cm in maximal craniocaudal dimension.  Adrenals/Urinary Tract: Adrenal glands are within normal limits.  Kidneys are within normal limits.  No hydronephrosis.  Layering excretory contrast (likely gadolinium) within the bladder.  Stomach/Bowel: Stomach is within normal limits.  No evidence of bowel obstruction.  Normal appendix.  Colonic diverticulosis, without evidence of diverticulitis.  Vascular/Lymphatic:  Atherosclerotic calcifications of the abdominal aorta and branch vessels.  Small upper abdominal lymph nodes measuring up to 10 mm short axis, likely reactive.  Reproductive: Prostate is unremarkable.  Other: Moderate fat containing left inguinal/ scrotal hernia.  Musculoskeletal: Degenerative changes of the lumbar spine.  IMPRESSION: 7.7 cm posterior right upper lobe mass which abuts the posterior chest wall/pleural surface, compatible with primary bronchogenic neoplasm.  9 mm short axis right hilar node, worrisome for nodal metastasis. Additional small right paratracheal and subcarinal nodes, indeterminate.  No evidence of metastatic disease in the abdomen/pelvis.  Additional ancillary findings as above.   Electronically Signed   By: Julian Hy M.D.   On: 05/21/2014 13:21   Ct Biopsy  05/23/2014   CLINICAL DATA:  Right upper lobe mass.  Intracranial lesion.  EXAM: CT-GUIDED BIOPSY OF A RIGHT UPPER LOBE MASS.  CORE.  MEDICATIONS AND MEDICAL HISTORY: Versed 2 mg, Fentanyl 100 mcg.  Additional Medications: None.  ANESTHESIA/SEDATION: Moderate sedation time: 10 minutes  PROCEDURE: The procedure, risks,  benefits, and alternatives were explained to the patient. Questions regarding the procedure were encouraged and answered. The patient understands and consents to the procedure.  The right upper posterior thorax was prepped with Betadine in a sterile fashion, and a sterile drape was applied covering the operative field. A sterile gown and sterile gloves were used for the procedure.  Under CT guidance, a(n) 17 gauge guide needle was advanced into the right upper lobe lung mass. Subsequently 3 18 gauge core biopsies were obtained. The guide needle was removed. Final imaging was performed.  Patient tolerated the procedure well without complication. Vital sign monitoring by nursing staff during the procedure will continue as patient is in the special procedures unit for post procedure observation.  FINDINGS: The images document guide needle placement within the right upper lobe lung mass. Post biopsy images demonstrate no pneumothorax.  COMPLICATIONS: None  IMPRESSION: Successful CT-guided right upper lobe lung mass core biopsy.   Electronically Signed   By: Marybelle Killings M.D.   On: 05/23/2014 14:13   Dg Chest Port 1 View  05/23/2014   CLINICAL DATA:  Evaluate for pneumothorax after lung biopsy  EXAM: PORTABLE CHEST - 1 VIEW  COMPARISON:  None.  FINDINGS: The heart size and mediastinal contours are within normal limits. Right upper lobe lung mass is again noted. No pneumothorax identified. The visualized skeletal structures are unremarkable.  IMPRESSION: 1. No pneumothorax noted following lung biopsy.   Electronically Signed   By: Kerby Moors M.D.   On: 05/23/2014 14:47    CBC  Recent Labs Lab 05/25/14 0805 05/26/14 0710 05/27/14 0635 05/28/14 0739 05/29/14 0602  WBC 12.6* 13.3* 13.8* 10.4 11.7*  HGB 15.9 16.4 16.4 16.0 16.6  HCT 43.7 45.5 45.5 45.2 46.3  PLT 163 154 153 132* 123*  MCV 88.6 88.7 89.9 90.6 90.3  MCH 32.3 32.0 32.4 32.1 32.4  MCHC 36.4* 36.0 36.0 35.4 35.9  RDW 13.0 12.9 13.4 13.6  13.4  LYMPHSABS 2.2 2.5 2.5 1.4 1.9  MONOABS 0.4 0.9 1.0 0.9 0.8  EOSABS 0.0 0.0 0.0 0.0 0.0  BASOSABS 0.0 0.0 0.0 0.0 0.0    Chemistries   Recent Labs Lab 05/23/14 0431 05/24/14 0458 05/25/14 0805  NA 136 134* 133*  K 4.3 4.6 3.8  CL 102 100 96*  CO2 '25 27 26  '$ GLUCOSE 204* 175* 268*  BUN '12 16 18  '$ CREATININE 0.67 0.70 0.68  CALCIUM 9.2 9.3 9.5  AST 49* 56* 56*  ALT 117* 125* 132*  ALKPHOS 57 61 60  BILITOT 0.5 0.8 1.2   ------------------------------------------------------------------------------------------------------------------ estimated creatinine clearance is 118.6 mL/min (by C-G formula based on Cr of 0.68). ------------------------------------------------------------------------------------------------------------------ No results for input(s): HGBA1C in the last 72 hours. ------------------------------------------------------------------------------------------------------------------ No results for input(s): CHOL, HDL, LDLCALC, TRIG, CHOLHDL, LDLDIRECT in the last 72 hours. ------------------------------------------------------------------------------------------------------------------ No results for input(s): TSH, T4TOTAL, T3FREE, THYROIDAB in the last 72 hours.  Invalid input(s): FREET3 ------------------------------------------------------------------------------------------------------------------ No results for input(s): VITAMINB12, FOLATE, FERRITIN, TIBC, IRON, RETICCTPCT in the last 72 hours.  Coagulation profile No results for input(s): INR, PROTIME in the last 168 hours.  No results for input(s): DDIMER in the last 72 hours.  Cardiac Enzymes No results for input(s): CKMB, TROPONINI, MYOGLOBIN in the last 168 hours.  Invalid input(s): CK ------------------------------------------------------------------------------------------------------------------ Invalid input(s): POCBNP    Parag Dorton D.O. on 05/29/2014 at 12:21 PM  Between 7am to  7pm - Pager - [phone]  After 7pm go to [email or web address] - password [email or web address]  And look for the night coverage person covering for me after hours  [facility] Office  [phone]

## 2014-05-29 NOTE — Progress Notes (Signed)
Chaplain initiated visit with pt. Pt reports that his pastor has been visiting regularly. Chaplain offered prayer at pt request and informed pt of her continued support. Page chaplain as needed.    05/29/14 0900  Clinical Encounter Type  Visited With Patient  Visit Type Initial;Spiritual support  Spiritual Encounters  Spiritual Needs Emotional;Prayer  Stress Factors  Patient Stress Factors Health changes  Dalary Hollar, Barbette Hair, Chaplain 05/29/2014 9:38 AM

## 2014-05-29 NOTE — Telephone Encounter (Signed)
New Patient appt-S/w patient and notified upon discharge his appt will be printed on discharge summary with contact information.  Dr. Alvy Bimler 05/19 @ 10:45.

## 2014-05-29 NOTE — Progress Notes (Signed)
  Radiation Oncology         [phone] ________________________________  Name: Ray Garcia MRN: [id number]  Date: 05/29/2014  DOB: 1960/05/25  SIMULATION AND TREATMENT PLANNING NOTE    ICD-9-CM ICD-10-CM   1. 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  Identity was confirmed.  All relevant records and images related to the planned course of therapy were reviewed.  The patient freely provided informed written consent to proceed with treatment after reviewing the details related to the planned course of therapy. The consent form was witnessed and verified by the simulation staff. Intravenous access was established for contrast administration. Then, the patient was set-up in a stable reproducible supine position for radiation therapy.  A relocatable thermoplastic stereotactic head frame was fabricated for precise immobilization.  CT images were obtained.  Surface markings were placed.  The CT images were loaded into the planning software and fused with the patient's targeting MRI scan.  Then the target and avoidance structures were contoured.  Treatment planning then occurred.  The radiation prescription was entered and confirmed.  I have requested 3D planning  I have requested a DVH of the following structures: Brain stem, brain, left eye, right eye, lenses, optic chiasm, target volumes, uninvolved brain, and normal tissue.    PLAN:  The patient will receive 16 Gy in one fraction pre-op to be followed by suboccipital craniectomy and tumor resection..   This document serves as a record of services personally performed by Ray Pita, MD. It was created on his behalf by Jeralene Peters, a trained medical scribe. The creation of this record is based on the scribe's personal observations and the provider's  statements to them. This document has been checked and approved by the attending provider.     ________________________________  Sheral Apley. Tammi Klippel, M.D.

## 2014-05-30 ENCOUNTER — Ambulatory Visit: Payer: MEDICAID | Admitting: Radiation Oncology

## 2014-05-30 ENCOUNTER — Ambulatory Visit: Admit: 2014-05-30 | Payer: MEDICAID | Admitting: Radiation Oncology

## 2014-05-30 DIAGNOSIS — E440 Moderate protein-calorie malnutrition: Secondary | ICD-10-CM

## 2014-05-30 LAB — CBC WITH DIFFERENTIAL/PLATELET
Basophils Absolute: 0 10*3/uL (ref 0.0–0.1)
Basophils Relative: 0 % (ref 0–1)
EOS PCT: 0 % (ref 0–5)
Eosinophils Absolute: 0 10*3/uL (ref 0.0–0.7)
HCT: 44.8 % (ref 39.0–52.0)
Hemoglobin: 15.6 g/dL (ref 13.0–17.0)
LYMPHS ABS: 1.3 10*3/uL (ref 0.7–4.0)
Lymphocytes Relative: 14 % (ref 12–46)
MCH: 31.6 pg (ref 26.0–34.0)
MCHC: 34.8 g/dL (ref 30.0–36.0)
MCV: 90.7 fL (ref 78.0–100.0)
Monocytes Absolute: 0.8 10*3/uL (ref 0.1–1.0)
Monocytes Relative: 9 % (ref 3–12)
Neutro Abs: 7.4 10*3/uL (ref 1.7–7.7)
Neutrophils Relative %: 78 % — ABNORMAL HIGH (ref 43–77)
PLATELETS: 103 10*3/uL — AB (ref 150–400)
RBC: 4.94 MIL/uL (ref 4.22–5.81)
RDW: 13.4 % (ref 11.5–15.5)
WBC: 9.5 10*3/uL (ref 4.0–10.5)

## 2014-05-30 MED ORDER — HYDRALAZINE HCL 20 MG/ML IJ SOLN: 10.0000 mg | Freq: Four times a day (QID) | INTRAMUSCULAR | Status: DC | PRN

## 2014-05-30 MED ORDER — OXYCODONE-ACETAMINOPHEN 5-325 MG PO TABS
1.0000 | ORAL_TABLET | ORAL | Status: DC | PRN
  Administered 2014-05-30: 1 via ORAL
  Filled 2014-05-30: qty 1

## 2014-05-30 MED ORDER — OXYCODONE HCL 5 MG PO TABS
5.0000 mg | ORAL_TABLET | ORAL | Status: DC | PRN
  Administered 2014-05-30 – 2014-06-08 (×21): 5 mg via ORAL
  Filled 2014-05-30 (×21): qty 1

## 2014-05-30 NOTE — Progress Notes (Signed)
No issues overnight. Pt laying in bed, no current complaints.  EXAM:  BP 143/82 mmHg  Pulse 68  Temp(Src) 98 F (36.7 C) (Oral)  Resp 18  Ht 5' 9.6" (1.768 m)  Wt 92.8 kg (204 lb 9.4 oz)  BMI 29.69 kg/m2  SpO2 96%  Awake, alert, oriented  Speech fluent, appropriate  CN grossly intact  5/5 BUE/BLE   IMPRESSION:  54 y.o. 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 Tammi Klippel and myself - Cytoreductive surgery on Tuesday am  I did review the risks, benefits, and alternatives to the above surgery with the patient. Specifically, risks include postoperative stroke leading to weakness, numbness, paralysis, coma, death, imbalance, incoordination. Risks of bleeding, infection, and hydrocephalus was also reviewed. The patient appeared to understand our discussion. All questions were answered and he provided consent to proceed.

## 2014-05-30 NOTE — Progress Notes (Signed)
Physical Therapy Treatment Patient Details Name: Ray Garcia MRN: [id number] DOB: 05-18-1960 Today's Date: 05/30/2014    History of Present Illness This is a 54 y.o. year old male with significant past medical history of cirrhosis, hepatitis C, HTN, dental caries/abscess, ETOH abuse presenting with cerebellar mass. Pt underwent biopsy and now awaiting results to determine excision of tumor.    PT Comments    Pt continues to indicate dizziness being biggest limitation to mobility.  Noted pt to be having procedure possibly Monday and Tuesday next week.  Will continue to follow along and need new order post-op.    Follow Up Recommendations  Home health PT;Supervision/Assistance - 24 hour (Will re-assess post-op.  )     Equipment Recommendations  Rolling walker with 5" wheels    Recommendations for Other Services       Precautions / Restrictions Precautions Precautions: Fall Restrictions Weight Bearing Restrictions: No    Mobility  Bed Mobility Overal bed mobility: Modified Independent                Transfers Overall transfer level: Needs assistance Equipment used: Rolling walker (2 wheeled) Transfers: Sit to/from Stand Sit to Stand: Min guard         General transfer comment: cues for UE use and getting closer to bed prior to sitting.  pt indicates dizziness and needs cues for gaze stabilization.    Ambulation/Gait Ambulation/Gait assistance: Min guard Ambulation Distance (Feet): 120 Feet Assistive device: Rolling walker (2 wheeled)       General Gait Details: pt continues to rely heavily on RW for support and stability due to dizziness.  pt with scissoring during turns and fluctuations in gait speed.     Stairs            Wheelchair Mobility    Modified Rankin (Stroke Patients Only)       Balance Overall balance assessment: Needs assistance Sitting-balance support: No upper extremity supported;Feet supported Sitting balance-Leahy Scale:  Fair     Standing balance support: Bilateral upper extremity supported Standing balance-Leahy Scale: Poor                      Cognition Arousal/Alertness: Awake/alert Behavior During Therapy: WFL for tasks assessed/performed Overall Cognitive Status: Within Functional Limits for tasks assessed                      Exercises      General Comments        Pertinent Vitals/Pain Pain Assessment: No/denies pain    Home Living                      Prior Function            PT Goals (current goals can now be found in the care plan section) Acute Rehab PT Goals Patient Stated Goal: to get better PT Goal Formulation: With patient Time For Goal Achievement: 06/02/14 Potential to Achieve Goals: Good Progress towards PT goals: Progressing toward goals    Frequency  Min 3X/week    PT Plan Current plan remains appropriate    Co-evaluation             End of Session Equipment Utilized During Treatment: Gait belt Activity Tolerance: Patient tolerated treatment well Patient left: in bed;with call bell/phone within reach;with nursing/sitter in room     Time: 1510-1526 PT Time Calculation (min) (ACUTE ONLY): 16 min  Charges:  $Gait Training: 8-22 mins  G CodesCatarina Hartshorn, [place] 05/30/2014, 3:32 PM

## 2014-05-30 NOTE — Progress Notes (Addendum)
[facility]                                                                              Patient Demographics  Ray Garcia, is a 54 y.o. male, DOB - 08-May-1960, QIH:[id number]  Admit date - 05/20/2014   Admitting Physician Lavina Hamman, MD  Outpatient Primary MD for the patient is [name], [name], MD  LOS - 10   No chief complaint on file.     HPI on 05/20/2014 by Dr. Shanda Howells This is a 54 y.o. year old male with significant past medical history of cirrhosis, hepatitis C, HTN, dental caries/abscess, ETOH abuse presenting with cerebellar mass. Patient presented today at [facility] with chief complaint of recurrent intermittent headaches as well as recurrent nausea and dizziness. States that symptoms have been fairly persistent for over the past 2 weeks. No reported head trauma. Reports only minimal alcohol intake. One to 2 beers per day. Previous heavy drinker. No hemiparesis or confusion. Also with noted dental caries and dental abscess. Is currently on oral penicillin for this. Presents his [facility] afebrile, hemodynamically stable. A head CT obtained that [facility] showed abnormal appearance of the left cerebellar hemisphere concerning for underlying mass lesion with associated edema. Per report, ER physician assistant discussed case with on-call neurosurgery at [facility] recommended medical admission with formal neurosurgical consultation. Labs are pending.   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 CT scan of lungs showing a 7.7 cm posterior right upper lobe mass, making this cerebellar mass lesion suspicious for metastasis -Radiation Onc and neurosurgery consulted consulted.  -Continue IV steroids- per neurosurgery, Dr. Kathyrn Sheriff, if non-small cell CA, may require resection of the cerebellar lesion  (note on 05/22/2014) -Spoke with Dr, Kathyrn Sheriff (on 05/27/2014) regarding results of biopsy (invavsive adenocarcinoma), and patient will likely need surgery with stereotactic radiation therapy/resection on 06/02/2014 and 06/03/2014 -Oncology, Dr. Alvy Bimler, consulted and appreciated, will follow up with patient on 5/19 as an outpatient  Headache/Dizziness -Like secondary to the above -Continue pain medication -Patient complains dizziness with standing  Probable metastatic lung cancer -Patient has a 30-pack-year smoking history, CT chest showed 7.7 cm posterior right upper lobe mass compatible with primary bronchogenic neoplasm. 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 -Resolved -As mentioned above likely secondary to cerebellar mass lesion  Liver cirrhosis: Likely due to hepatitis C and chronic alcoholism -CT abdomen and pelvis showed no evidence of metastatic disease in the abdomen and pelvis.   Chronic alcoholism. -Patient reported that his last drink about a month ago, currently does not exhibit signs or symptoms of alcohol withdrawal  Essential hypertension  -Currently stable  Constipation -Resolved, continue colace  Leukocytosis -Improving, Likely secondary to decadron -Continue to monitor  Dental caries/abscess -Was on PCN as an outpatient -Discontinued clindamycin (received 10 days of antibiotics)  Code Status: Full  Family Communication: None at bedside. Spoke with his wife via phone on 05/28/2014.  Disposition Plan:  Admitted, pending further workup and SRS and resection on 5/9 and 5/10.  Time Spent in minutes   25 minutes  Procedures  CT guided biopsy- by IR  Consults   Pulmonology Interventional radiology Neurosurgery Oncology  DVT Prophylaxis  Heparin  Lab Results  Component Value Date   PLT PENDING  05/30/2014    Medications  Scheduled Meds: . dexamethasone  4 mg Intravenous 3 times per day  . docusate sodium  100 mg Oral BID  . feeding supplement (ENSURE ENLIVE)  237 mL Oral BID BM  . folic acid  1 mg Oral Daily  . heparin  5,000 Units Subcutaneous 3 times per day  . multivitamin with minerals  1 tablet Oral Daily  . polyethylene glycol  17 g Oral Daily  . saccharomyces boulardii  250 mg Oral BID  . thiamine  100 mg Oral Daily   Continuous Infusions:  PRN Meds:.morphine injection, ondansetron **OR** ondansetron (ZOFRAN) IV  Antibiotics    Anti-infectives    Start     Dose/Rate Route Frequency Ordered Stop   05/22/14 0015  clindamycin (CLEOCIN) capsule 300 mg  Status:  Discontinued     300 mg Oral 3 times per day 05/21/14 2359 05/29/14 1226   05/21/14 0645  penicillin v potassium (VEETID) tablet 500 mg  Status:  Discontinued     500 mg Oral 4 times per day 05/21/14 3790 05/21/14 2358      Subjective:   [name] seen and examined today.  Patient complains of headache in the back of his head.  He feels some dizziness with headache.  He currently denies chest pain, shortness of breath, abdominal pain, nausea, vomiting constipation or diarrhea.  Patient states he is moving his bowels well has a good appetite.   Objective:   Filed Vitals:   05/29/14 1751 05/29/14 2123 05/30/14 0155 05/30/14 0619  BP: 141/88 169/104 138/86 150/97  Pulse: 63 68 68 56  Temp: 98.2 F (36.8 C) 98.7 F (37.1 C) 97.5 F (36.4 C) 97.6 F (36.4 C)  TempSrc: Oral Oral Oral Oral  Resp: '20 20 20 18  '$ Height:      Weight:      SpO2: 96% 97% 97% 96%    Wt Readings from Last 3 Encounters:  05/20/14 92.8 kg (204 lb 9.4 oz)     Intake/Output Summary (Last 24 hours) at 05/30/14 2409 Last data filed at 05/30/14 7353  Gross per 24 hour  Intake      0 ml  Output   1950 ml  Net  -1950 ml    Exam  General: Well developed, well nourished,  No distress  Cardiovascular: Normal S1/S2,  RRR, no murmurs  Respiratory: Clear to auscultation   Abdomen: Soft, nontender, nondistended, + bowel sounds  Extremities: warm dry without cyanosis clubbing or edema  Neuro: AAOx3, nonfocal  Psych: Pleasant. Appropriate mood and affect  Data Review   Micro Results No results found for this or any previous visit (from the past 240 hour(s)).  Radiology Reports Ct Chest W Contrast  05/21/2014   CLINICAL DATA:  Cirrhosis, hepatitis C. Cerebellar mass. Lung mass on chest radiograph. Evaluate for metastatic disease.  EXAM: CT CHEST, ABDOMEN, AND PELVIS WITH CONTRAST  TECHNIQUE: Multidetector CT imaging of the chest, abdomen and pelvis was performed following the standard protocol during bolus administration of intravenous contrast.  CONTRAST:  163m OMNIPAQUE IOHEXOL 300 MG/ML  SOLN  COMPARISON:  Chest radiograph dated 05/20/2014.  FINDINGS: CT CHEST FINDINGS  Mediastinum/Nodes:  The heart is normal in size. No pericardial effusion.  Coronary atherosclerosis in the LAD.  9 mm short axis right hilar node (series 3/ image 23). Small right paratracheal nodes measuring up to 6 mm short axis (series 3/image 20). 7 mm short axis subcarinal node (series 3/ image 23).  No suspicious axillary or supraclavicular nodes.  Visualized thyroid is unremarkable.  Lungs/Pleura: Macrolobulated 6.6 x 7.7 x 6.4 cm mass in the posterior right upper lobe (series 4/image 14), which abuts the posterior chest wall/pleural surface, but does not demonstrate definite overlying osseous invasion.  No evidence of satellite nodularity or lymphangitic spread. Lungs are otherwise clear.  Mild dependent scarring/ atelectasis in the right lower lobe.  No pleural effusion or pneumothorax.  Musculoskeletal: Mild degenerative changes of the thoracic spine.  CT ABDOMEN PELVIS FINDINGS  Hepatobiliary: Cirrhosis. Mildly increased subcapsular perfusion inferiorly in thea posterior segment right hepatic lobe (series 3/image 70), which normalizes  on the delayed phase, without definite underlying mass.  Cholelithiasis (series 3/ image 66). Gallbladder is underdistended. No intrahepatic or extrahepatic ductal dilatation.  Pancreas: Within normal limits.  Spleen: Splenomegaly, measuring 15.9 cm in maximal craniocaudal dimension.  Adrenals/Urinary Tract: Adrenal glands are within normal limits.  Kidneys are within normal limits.  No hydronephrosis.  Layering excretory contrast (likely gadolinium) within the bladder.  Stomach/Bowel: Stomach is within normal limits.  No evidence of bowel obstruction.  Normal appendix.  Colonic diverticulosis, without evidence of diverticulitis.  Vascular/Lymphatic: Atherosclerotic calcifications of the abdominal aorta and branch vessels.  Small upper abdominal lymph nodes measuring up to 10 mm short axis, likely reactive.  Reproductive: Prostate is unremarkable.  Other: Moderate fat containing left inguinal/ scrotal hernia.  Musculoskeletal: Degenerative changes of the lumbar spine.  IMPRESSION: 7.7 cm posterior right upper lobe mass which abuts the posterior chest wall/pleural surface, compatible with primary bronchogenic neoplasm.  9 mm short axis right hilar node, worrisome for nodal metastasis. Additional small right paratracheal and subcarinal nodes, indeterminate.  No evidence of metastatic disease in the abdomen/pelvis.  Additional ancillary findings as above.   Electronically Signed   By: Julian Hy M.D.   On: 05/21/2014 13:21   Mr Jeri Cos XA Contrast  05/27/2014   CLINICAL DATA:  Metastatic disease.  Lung mass.  EXAM: MRI HEAD WITHOUT AND WITH CONTRAST  TECHNIQUE: Multiplanar, multiecho pulse sequences of the brain and surrounding structures were obtained without and with intravenous contrast.  CONTRAST:  55m MULTIHANCE GADOBENATE DIMEGLUMINE 529 MG/ML IV SOLN  COMPARISON:  MRI head 05/21/2014  FINDINGS: SRS protocol at 3 Tesla performed.  Enhancing mass lesion in the left cerebellar tonsil measures 33 x 26 x 37  mm and is unchanged in size. The mass shows diffuse enhancement. There may be some minimal internal hemorrhage. The white matter edema has improved since the prior study due to steroid treatment. There remains mass-effect on the fourth ventricle which is flattened and displaced slightly to the right. No hydrocephalus. The mass extends into the left posterior lateral medulla were there is some edema and enhancing tumor.  No other enhancing mass lesions identified.  Ventricle size is normal. Negative for acute infarct. No significant chronic ischemia.  Paranasal sinuses clear neck  The patient was not able to hold still and some of the images are degraded by motion  IMPRESSION: 33 x 26 x 27 mm left cerebellar tonsillar mass is unchanged in size. There is invasion of the posterior lateral medulla on the left. Surrounding white matter edema has improved following  treatment with steroids. This appears to be a solitary metastatic deposit.   Electronically Signed   By: Franchot Gallo M.D.   On: 05/27/2014 19:28   Mr Jeri Cos MP Contrast  05/21/2014   CLINICAL DATA:  Dizziness with nausea and vomiting. Known brain mass.  EXAM: MRI HEAD WITHOUT AND WITH CONTRAST  TECHNIQUE: Multiplanar, multiecho pulse sequences of the brain and surrounding structures were obtained without and with intravenous contrast.  CONTRAST:  82m MULTIHANCE GADOBENATE DIMEGLUMINE 529 MG/ML IV SOLN  [facility]  FINDINGS: Calvarium and upper cervical spine: No focal marrow signal abnormality.  Orbits: No significant findings.  Sinuses: Retention cysts in the adenoid. No paranasal sinus or mastoid obstruction.  Brain: There is diffusely enhancing mass located in the lower left cerebellum with growth across the inferior cerebellar peduncle into the dorsal lateral medulla. The mass measures 32 x 25 x 37 mm. T2 hypo intensity and partial diffusion restriction suggests dense cellularity. There could  be minimal blood products internally, but no measurable hematoma. No additional mass lesion is seen. Vasogenic edema surrounds the mass, with crowding in the posterior fossa and upper displacement of the cerebellum. The lower fourth ventricle it is partially effaced, but there is no hydrocephalus.  No infarct, major vessel occlusion, for significant white matter disease.  IMPRESSION: 1. 32 x 25 x 37 mm densely cellular left cerebellar mass which extends into the left inferior peduncle and medulla. Given patient's right upper lobe mass, this is most consistent with a solitary metastasis. 2. Vasogenic edema partially effaces the lower fourth ventricle. No hydrocephalus.   Electronically Signed   By: JMonte FantasiaM.D.   On: 05/21/2014 12:04   Ct Abdomen Pelvis W Contrast  05/21/2014   CLINICAL DATA:  Cirrhosis, hepatitis C. Cerebellar mass. Lung mass on chest radiograph. Evaluate for metastatic disease.  EXAM: CT CHEST, ABDOMEN, AND PELVIS WITH CONTRAST  TECHNIQUE: Multidetector CT imaging of the chest, abdomen and pelvis was performed following the standard protocol during bolus administration of intravenous contrast.  CONTRAST:  1035mOMNIPAQUE IOHEXOL 300 MG/ML  SOLN  COMPARISON:  Chest radiograph dated 05/20/2014.  FINDINGS: CT CHEST FINDINGS  Mediastinum/Nodes: The heart is normal in size. No pericardial effusion.  Coronary atherosclerosis in the LAD.  9 mm short axis right hilar node (series 3/ image 23). Small right paratracheal nodes measuring up to 6 mm short axis (series 3/image 20). 7 mm short axis subcarinal node (series 3/ image 23).  No suspicious axillary or supraclavicular nodes.  Visualized thyroid is unremarkable.  Lungs/Pleura: Macrolobulated 6.6 x 7.7 x 6.4 cm mass in the posterior right upper lobe (series 4/image 14), which abuts the posterior chest wall/pleural surface, but does not demonstrate definite overlying osseous invasion.  No evidence of satellite nodularity or lymphangitic spread.  Lungs are otherwise clear.  Mild dependent scarring/ atelectasis in the right lower lobe.  No pleural effusion or pneumothorax.  Musculoskeletal: Mild degenerative changes of the thoracic spine.  CT ABDOMEN PELVIS FINDINGS  Hepatobiliary: Cirrhosis. Mildly increased subcapsular perfusion inferiorly in thea posterior segment right hepatic lobe (series 3/image 70), which normalizes on the delayed phase, without definite underlying mass.  Cholelithiasis (series 3/ image 66). Gallbladder is underdistended. No intrahepatic or extrahepatic ductal dilatation.  Pancreas: Within normal limits.  Spleen: Splenomegaly, measuring 15.9 cm in maximal craniocaudal dimension.  Adrenals/Urinary Tract: Adrenal glands are within normal limits.  Kidneys are within normal limits.  No hydronephrosis.  Layering excretory  contrast (likely gadolinium) within the bladder.  Stomach/Bowel: Stomach is within normal limits.  No evidence of bowel obstruction.  Normal appendix.  Colonic diverticulosis, without evidence of diverticulitis.  Vascular/Lymphatic: Atherosclerotic calcifications of the abdominal aorta and branch vessels.  Small upper abdominal lymph nodes measuring up to 10 mm short axis, likely reactive.  Reproductive: Prostate is unremarkable.  Other: Moderate fat containing left inguinal/ scrotal hernia.  Musculoskeletal: Degenerative changes of the lumbar spine.  IMPRESSION: 7.7 cm posterior right upper lobe mass which abuts the posterior chest wall/pleural surface, compatible with primary bronchogenic neoplasm.  9 mm short axis right hilar node, worrisome for nodal metastasis. Additional small right paratracheal and subcarinal nodes, indeterminate.  No evidence of metastatic disease in the abdomen/pelvis.  Additional ancillary findings as above.   Electronically Signed   By: Julian Hy M.D.   On: 05/21/2014 13:21   Ct Biopsy  05/23/2014   CLINICAL DATA:  Right upper lobe mass.  Intracranial lesion.  EXAM: CT-GUIDED BIOPSY  OF A RIGHT UPPER LOBE MASS.  CORE.  MEDICATIONS AND MEDICAL HISTORY: Versed 2 mg, Fentanyl 100 mcg.  Additional Medications: None.  ANESTHESIA/SEDATION: Moderate sedation time: 10 minutes  PROCEDURE: The procedure, risks, benefits, and alternatives were explained to the patient. Questions regarding the procedure were encouraged and answered. The patient understands and consents to the procedure.  The right upper posterior thorax was prepped with Betadine in a sterile fashion, and a sterile drape was applied covering the operative field. A sterile gown and sterile gloves were used for the procedure.  Under CT guidance, a(n) 17 gauge guide needle was advanced into the right upper lobe lung mass. Subsequently 3 18 gauge core biopsies were obtained. The guide needle was removed. Final imaging was performed.  Patient tolerated the procedure well without complication. Vital sign monitoring by nursing staff during the procedure will continue as patient is in the special procedures unit for post procedure observation.  FINDINGS: The images document guide needle placement within the right upper lobe lung mass. Post biopsy images demonstrate no pneumothorax.  COMPLICATIONS: None  IMPRESSION: Successful CT-guided right upper lobe lung mass core biopsy.   Electronically Signed   By: Marybelle Killings M.D.   On: 05/23/2014 14:13   Dg Chest Port 1 View  05/23/2014   CLINICAL DATA:  Evaluate for pneumothorax after lung biopsy  EXAM: PORTABLE CHEST - 1 VIEW  COMPARISON:  None.  FINDINGS: The heart size and mediastinal contours are within normal limits. Right upper lobe lung mass is again noted. No pneumothorax identified. The visualized skeletal structures are unremarkable.  IMPRESSION: 1. No pneumothorax noted following lung biopsy.   Electronically Signed   By: Kerby Moors M.D.   On: 05/23/2014 14:47    CBC  Recent Labs Lab 05/26/14 0710 05/27/14 0635 05/28/14 0739 05/29/14 0602 05/30/14 0543  WBC 13.3* 13.8* 10.4  11.7* 9.5  HGB 16.4 16.4 16.0 16.6 15.6  HCT 45.5 45.5 45.2 46.3 44.8  PLT 154 153 132* 123* PENDING  MCV 88.7 89.9 90.6 90.3 90.7  MCH 32.0 32.4 32.1 32.4 31.6  MCHC 36.0 36.0 35.4 35.9 34.8  RDW 12.9 13.4 13.6 13.4 13.4  LYMPHSABS 2.5 2.5 1.4 1.9 1.3  MONOABS 0.9 1.0 0.9 0.8 0.8  EOSABS 0.0 0.0 0.0 0.0 0.0  BASOSABS 0.0 0.0 0.0 0.0 0.0    Chemistries   Recent Labs Lab 05/24/14 0458 05/25/14 0805  NA 134* 133*  K 4.6 3.8  CL 100 96*  CO2 27 26  GLUCOSE 175* 268*  BUN 16 18  CREATININE 0.70 0.68  CALCIUM 9.3 9.5  AST 56* 56*  ALT 125* 132*  ALKPHOS 61 60  BILITOT 0.8 1.2   ------------------------------------------------------------------------------------------------------------------ estimated creatinine clearance is 118.6 mL/min (by C-G formula based on Cr of 0.68). ------------------------------------------------------------------------------------------------------------------ No results for input(s): HGBA1C in the last 72 hours. ------------------------------------------------------------------------------------------------------------------ No results for input(s): CHOL, HDL, LDLCALC, TRIG, CHOLHDL, LDLDIRECT in the last 72 hours. ------------------------------------------------------------------------------------------------------------------ No results for input(s): TSH, T4TOTAL, T3FREE, THYROIDAB in the last 72 hours.  Invalid input(s): FREET3 ------------------------------------------------------------------------------------------------------------------ No results for input(s): VITAMINB12, FOLATE, FERRITIN, TIBC, IRON, RETICCTPCT in the last 72 hours.  Coagulation profile No results for input(s): INR, PROTIME in the last 168 hours.  No results for input(s): DDIMER in the last 72 hours.  Cardiac Enzymes No results for input(s): CKMB, TROPONINI, MYOGLOBIN in the last 168 hours.  Invalid input(s):  CK ------------------------------------------------------------------------------------------------------------------ Invalid input(s): POCBNP    Kesha Hurrell D.O. on 05/30/2014 at 8:19 AM  Between 7am to 7pm - Pager - [phone]  After 7pm go to [email or web address] - password [email or web address]  And look for the night coverage person covering for me after hours  [facility] Office  [phone]

## 2014-05-31 DIAGNOSIS — D696 Thrombocytopenia, unspecified: Secondary | ICD-10-CM

## 2014-05-31 LAB — CBC WITH DIFFERENTIAL/PLATELET
Basophils Absolute: 0 10*3/uL (ref 0.0–0.1)
Basophils Relative: 0 % (ref 0–1)
Eosinophils Absolute: 0 10*3/uL (ref 0.0–0.7)
Eosinophils Relative: 0 % (ref 0–5)
HCT: 43.6 % (ref 39.0–52.0)
Hemoglobin: 15.2 g/dL (ref 13.0–17.0)
LYMPHS PCT: 10 % — AB (ref 12–46)
Lymphs Abs: 0.9 10*3/uL (ref 0.7–4.0)
MCH: 32.1 pg (ref 26.0–34.0)
MCHC: 34.9 g/dL (ref 30.0–36.0)
MCV: 92 fL (ref 78.0–100.0)
MONO ABS: 0.8 10*3/uL (ref 0.1–1.0)
Monocytes Relative: 9 % (ref 3–12)
Neutro Abs: 7.3 10*3/uL (ref 1.7–7.7)
Neutrophils Relative %: 81 % — ABNORMAL HIGH (ref 43–77)
Platelets: 87 10*3/uL — ABNORMAL LOW (ref 150–400)
RBC: 4.74 MIL/uL (ref 4.22–5.81)
RDW: 13.4 % (ref 11.5–15.5)
WBC: 8.9 10*3/uL (ref 4.0–10.5)

## 2014-05-31 MED ORDER — MECLIZINE HCL 12.5 MG PO TABS
12.5000 mg | ORAL_TABLET | Freq: Three times a day (TID) | ORAL | Status: DC | PRN
  Administered 2014-05-31: 12.5 mg via ORAL
  Filled 2014-05-31: qty 1

## 2014-05-31 MED ORDER — HYDRALAZINE HCL 20 MG/ML IJ SOLN
10.0000 mg | Freq: Four times a day (QID) | INTRAMUSCULAR | Status: DC | PRN
  Administered 2014-06-03: 10 mg via INTRAVENOUS
  Filled 2014-05-31 (×2): qty 1

## 2014-05-31 NOTE — Progress Notes (Signed)
[facility]                                                                              Patient Demographics  Ray Garcia, is a 54 y.o. male, DOB - 12-Jul-1960, CBJ:[id number]  Admit date - 05/20/2014   Admitting Physician Ray Hamman, MD  Outpatient Primary MD for the patient is [name], Ray D, MD  LOS - 11   No chief complaint on file.     HPI on 05/20/2014 by Dr. Shanda Garcia This is a 54 y.o. year old male with significant past medical history of cirrhosis, hepatitis C, HTN, dental caries/abscess, ETOH abuse presenting with cerebellar mass. Patient presented today at [facility] with chief complaint of recurrent intermittent headaches as well as recurrent nausea and dizziness. States that symptoms have been fairly persistent for over the past 2 weeks. No reported head trauma. Reports only minimal alcohol intake. One to 2 beers per day. Previous heavy drinker. No hemiparesis or confusion. Also with noted dental caries and dental abscess. Is currently on oral penicillin for this. Presents his [facility] afebrile, hemodynamically stable. A head CT obtained that [facility] showed abnormal appearance of the left cerebellar hemisphere concerning for underlying mass lesion with associated edema. Per report, ER physician assistant discussed case with on-call neurosurgery at [facility] recommended medical admission with formal neurosurgical consultation. Labs are pending.   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 CT scan of lungs showing a 7.7 cm posterior right upper lobe mass, making this cerebellar mass lesion suspicious for metastasis -Radiation Onc and neurosurgery consulted consulted.  -Continue IV steroids- per neurosurgery, Dr. Kathyrn Garcia, if non-small cell CA, may require resection of the cerebellar lesion  (note on 05/22/2014) -Spoke with Dr, Ray Garcia (on 05/27/2014) regarding results of biopsy (invavsive adenocarcinoma) -Patient scheduled for stereotatic radiosurgery on 06/02/2014 -Patient scheduled for suboccipital craniotomy for tumor resectoion on 06/03/2014 -Oncology, Dr. Alvy Garcia, consulted and appreciated, will follow up with patient on 5/19 as an outpatient  Headache/Dizziness -Like secondary to the above -Continue pain medication -Patient complains dizziness with standing -Will add on low dose meclizine  Probable metastatic lung cancer -Patient has a 30-pack-year smoking history, CT chest showed 7.7 cm posterior right upper lobe mass compatible with primary bronchogenic neoplasm. -Patient underwent CT-guided biopsy 4/29, Invasive adenocarcinoma primary to lung -Treatment and plan as above  Hypertensin -Likely secondary to pain -Continue hydralazine PRN for SBP >150 or DBP >110  Nausea/vomiting. -Resolved -As mentioned above likely secondary to cerebellar mass lesion  Liver cirrhosis: Likely due to hepatitis C and chronic alcoholism -CT abdomen and pelvis showed no evidence of metastatic disease in the abdomen and pelvis.   Chronic alcoholism. -Patient reported that his last drink about a month ago, currently does not exhibit signs or symptoms of alcohol withdrawal  Essential hypertension  -Currently stable  Constipation -Resolved, continue colace  Leukocytosis -Resolved, Likely secondary to decadron -Continue to monitor  Thrombocytopenia -Platelets <100, will discontinue heparin and place patient on SCDs -Continue to monitor CBC  Dental caries/abscess -Was on PCN as  an outpatient -Discontinued clindamycin (received 10 days of antibiotics)  Code Status: Full  Family Communication: None at bedside  Disposition Plan: Admitted, pending further workup and SRS and resection on 5/9 and 5/10.  Time Spent in minutes   20 minutes  Procedures  CT guided biopsy- by  IR  Consults   Pulmonology Interventional radiology Neurosurgery Oncology  DVT Prophylaxis  Heparin  Lab Results  Component Value Date   PLT 87* 05/31/2014    Medications  Scheduled Meds: . dexamethasone  4 mg Intravenous 3 times per day  . docusate sodium  100 mg Oral BID  . feeding supplement (ENSURE ENLIVE)  237 mL Oral BID BM  . folic acid  1 mg Oral Daily  . heparin  5,000 Units Subcutaneous 3 times per day  . multivitamin with minerals  1 tablet Oral Daily  . polyethylene glycol  17 g Oral Daily  . saccharomyces boulardii  250 mg Oral BID  . thiamine  100 mg Oral Daily   Continuous Infusions:  PRN Meds:.hydrALAZINE, meclizine, morphine injection, ondansetron **OR** ondansetron (ZOFRAN) IV, oxyCODONE  Antibiotics    Anti-infectives    Start     Dose/Rate Route Frequency Ordered Stop   05/22/14 0015  clindamycin (CLEOCIN) capsule 300 mg  Status:  Discontinued     300 mg Oral 3 times per day 05/21/14 2359 05/29/14 1226   05/21/14 0645  penicillin v potassium (VEETID) tablet 500 mg  Status:  Discontinued     500 mg Oral 4 times per day 05/21/14 2119 05/21/14 2358      Subjective:   Ray Garcia seen and examined today.  Patient continues to complain of dizziness.  Feels the headache had improved some.  Denies chest pain, shortness of breath or abdominal pain.    Objective:   Filed Vitals:   05/31/14 0137 05/31/14 0500 05/31/14 0644 05/31/14 0930  BP: 154/95 150/90 137/94 156/90  Pulse: 53 60 59 62  Temp: 99.2 F (37.3 C) 99 F (37.2 C) 98.2 F (36.8 C) 98.8 F (37.1 C)  TempSrc: Oral Oral Oral Oral  Resp: '18 18  18  '$ Height:      Weight:      SpO2: 92% 94% 96% 96%    Wt Readings from Last 3 Encounters:  05/20/14 92.8 kg (204 lb 9.4 oz)     Intake/Output Summary (Last 24 hours) at 05/31/14 1049 Last data filed at 05/31/14 0801  Gross per 24 hour  Intake    320 ml  Output   1500 ml  Net  -1180 ml    Exam  General: Well developed, well  nourished,  No distress  Cardiovascular: Normal S1/S2, RRR, no murmurs  Respiratory: Clear to auscultation   Abdomen: Soft, nontender, nondistended, + bowel sounds  Extremities: warm dry without cyanosis clubbing or edema  Neuro: AAOx3, nonfocal  Skin: ecchymoses on the abdomen ([place] heparin)  Psych: Pleasant. Appropriate mood and affect  Data Review   Micro Results No results found for this or any previous visit (from the past 240 hour(s)).  Radiology Reports Ct Chest W Contrast  05/21/2014   CLINICAL DATA:  Cirrhosis, hepatitis C. Cerebellar mass. Lung mass on chest radiograph. Evaluate for metastatic disease.  EXAM: CT CHEST, ABDOMEN, AND PELVIS WITH CONTRAST  TECHNIQUE: Multidetector CT imaging of the chest, abdomen and pelvis was performed following the standard protocol during bolus administration of intravenous contrast.  CONTRAST:  114m OMNIPAQUE IOHEXOL 300 MG/ML  SOLN  COMPARISON:  Chest radiograph  dated 05/20/2014.  FINDINGS: CT CHEST FINDINGS  Mediastinum/Nodes: The heart is normal in size. No pericardial effusion.  Coronary atherosclerosis in the LAD.  9 mm short axis right hilar node (series 3/ image 23). Small right paratracheal nodes measuring up to 6 mm short axis (series 3/image 20). 7 mm short axis subcarinal node (series 3/ image 23).  No suspicious axillary or supraclavicular nodes.  Visualized thyroid is unremarkable.  Lungs/Pleura: Macrolobulated 6.6 x 7.7 x 6.4 cm mass in the posterior right upper lobe (series 4/image 14), which abuts the posterior chest wall/pleural surface, but does not demonstrate definite overlying osseous invasion.  No evidence of satellite nodularity or lymphangitic spread. Lungs are otherwise clear.  Mild dependent scarring/ atelectasis in the right lower lobe.  No pleural effusion or pneumothorax.  Musculoskeletal: Mild degenerative changes of the thoracic spine.  CT ABDOMEN PELVIS FINDINGS  Hepatobiliary: Cirrhosis. Mildly increased subcapsular  perfusion inferiorly in thea posterior segment right hepatic lobe (series 3/image 70), which normalizes on the delayed phase, without definite underlying mass.  Cholelithiasis (series 3/ image 66). Gallbladder is underdistended. No intrahepatic or extrahepatic ductal dilatation.  Pancreas: Within normal limits.  Spleen: Splenomegaly, measuring 15.9 cm in maximal craniocaudal dimension.  Adrenals/Urinary Tract: Adrenal glands are within normal limits.  Kidneys are within normal limits.  No hydronephrosis.  Layering excretory contrast (likely gadolinium) within the bladder.  Stomach/Bowel: Stomach is within normal limits.  No evidence of bowel obstruction.  Normal appendix.  Colonic diverticulosis, without evidence of diverticulitis.  Vascular/Lymphatic: Atherosclerotic calcifications of the abdominal aorta and branch vessels.  Small upper abdominal lymph nodes measuring up to 10 mm short axis, likely reactive.  Reproductive: Prostate is unremarkable.  Other: Moderate fat containing left inguinal/ scrotal hernia.  Musculoskeletal: Degenerative changes of the lumbar spine.  IMPRESSION: 7.7 cm posterior right upper lobe mass which abuts the posterior chest wall/pleural surface, compatible with primary bronchogenic neoplasm.  9 mm short axis right hilar node, worrisome for nodal metastasis. Additional small right paratracheal and subcarinal nodes, indeterminate.  No evidence of metastatic disease in the abdomen/pelvis.  Additional ancillary findings as above.   Electronically Signed   By: Julian Hy M.Garcia.   On: 05/21/2014 13:21   Mr Jeri Cos ZO Contrast  05/27/2014   CLINICAL DATA:  Metastatic disease.  Lung mass.  EXAM: MRI HEAD WITHOUT AND WITH CONTRAST  TECHNIQUE: Multiplanar, multiecho pulse sequences of the brain and surrounding structures were obtained without and with intravenous contrast.  CONTRAST:  65m MULTIHANCE GADOBENATE DIMEGLUMINE 529 MG/ML IV SOLN  COMPARISON:  MRI head 05/21/2014  FINDINGS: SRS  protocol at 3 Tesla performed.  Enhancing mass lesion in the left cerebellar tonsil measures 33 x 26 x 37 mm and is unchanged in size. The mass shows diffuse enhancement. There may be some minimal internal hemorrhage. The white matter edema has improved since the prior study due to steroid treatment. There remains mass-effect on the fourth ventricle which is flattened and displaced slightly to the right. No hydrocephalus. The mass extends into the left posterior lateral medulla were there is some edema and enhancing tumor.  No other enhancing mass lesions identified.  Ventricle size is normal. Negative for acute infarct. No significant chronic ischemia.  Paranasal sinuses clear neck  The patient was not able to hold still and some of the images are degraded by motion  IMPRESSION: 33 x 26 x 27 mm left cerebellar tonsillar mass is unchanged in size. There is invasion of the posterior lateral medulla on  the left. Surrounding white matter edema has improved following treatment with steroids. This appears to be a solitary metastatic deposit.   Electronically Signed   By: Franchot Gallo M.Garcia.   On: 05/27/2014 19:28   Mr Jeri Cos JT Contrast  05/21/2014   CLINICAL DATA:  Dizziness with nausea and vomiting. Known brain mass.  EXAM: MRI HEAD WITHOUT AND WITH CONTRAST  TECHNIQUE: Multiplanar, multiecho pulse sequences of the brain and surrounding structures were obtained without and with intravenous contrast.  CONTRAST:  56m MULTIHANCE GADOBENATE DIMEGLUMINE 529 MG/ML IV SOLN  [facility]  FINDINGS: Calvarium and upper cervical spine: No focal marrow signal abnormality.  Orbits: No significant findings.  Sinuses: Retention cysts in the adenoid. No paranasal sinus or mastoid obstruction.  Brain: There is diffusely enhancing mass located in the lower left cerebellum with growth across the inferior cerebellar peduncle into the dorsal lateral medulla. The mass measures 32 x  25 x 37 mm. T2 hypo intensity and partial diffusion restriction suggests dense cellularity. There could be minimal blood products internally, but no measurable hematoma. No additional mass lesion is seen. Vasogenic edema surrounds the mass, with crowding in the posterior fossa and upper displacement of the cerebellum. The lower fourth ventricle it is partially effaced, but there is no hydrocephalus.  No infarct, major vessel occlusion, for significant white matter disease.  IMPRESSION: 1. 32 x 25 x 37 mm densely cellular left cerebellar mass which extends into the left inferior peduncle and medulla. Given patient's right upper lobe mass, this is most consistent with a solitary metastasis. 2. Vasogenic edema partially effaces the lower fourth ventricle. No hydrocephalus.   Electronically Signed   By: JMonte FantasiaM.Garcia.   On: 05/21/2014 12:04   Ct Abdomen Pelvis W Contrast  05/21/2014   CLINICAL DATA:  Cirrhosis, hepatitis C. Cerebellar mass. Lung mass on chest radiograph. Evaluate for metastatic disease.  EXAM: CT CHEST, ABDOMEN, AND PELVIS WITH CONTRAST  TECHNIQUE: Multidetector CT imaging of the chest, abdomen and pelvis was performed following the standard protocol during bolus administration of intravenous contrast.  CONTRAST:  1058mOMNIPAQUE IOHEXOL 300 MG/ML  SOLN  COMPARISON:  Chest radiograph dated 05/20/2014.  FINDINGS: CT CHEST FINDINGS  Mediastinum/Nodes: The heart is normal in size. No pericardial effusion.  Coronary atherosclerosis in the LAD.  9 mm short axis right hilar node (series 3/ image 23). Small right paratracheal nodes measuring up to 6 mm short axis (series 3/image 20). 7 mm short axis subcarinal node (series 3/ image 23).  No suspicious axillary or supraclavicular nodes.  Visualized thyroid is unremarkable.  Lungs/Pleura: Macrolobulated 6.6 x 7.7 x 6.4 cm mass in the posterior right upper lobe (series 4/image 14), which abuts the posterior chest wall/pleural surface, but does not  demonstrate definite overlying osseous invasion.  No evidence of satellite nodularity or lymphangitic spread. Lungs are otherwise clear.  Mild dependent scarring/ atelectasis in the right lower lobe.  No pleural effusion or pneumothorax.  Musculoskeletal: Mild degenerative changes of the thoracic spine.  CT ABDOMEN PELVIS FINDINGS  Hepatobiliary: Cirrhosis. Mildly increased subcapsular perfusion inferiorly in thea posterior segment right hepatic lobe (series 3/image 70), which normalizes on the delayed phase, without definite underlying mass.  Cholelithiasis (series 3/ image 66). Gallbladder is underdistended. No intrahepatic or extrahepatic ductal dilatation.  Pancreas: Within normal limits.  Spleen: Splenomegaly, measuring 15.9 cm in maximal craniocaudal dimension.  Adrenals/Urinary Tract: Adrenal glands are within normal limits.  Kidneys are  within normal limits.  No hydronephrosis.  Layering excretory contrast (likely gadolinium) within the bladder.  Stomach/Bowel: Stomach is within normal limits.  No evidence of bowel obstruction.  Normal appendix.  Colonic diverticulosis, without evidence of diverticulitis.  Vascular/Lymphatic: Atherosclerotic calcifications of the abdominal aorta and branch vessels.  Small upper abdominal lymph nodes measuring up to 10 mm short axis, likely reactive.  Reproductive: Prostate is unremarkable.  Other: Moderate fat containing left inguinal/ scrotal hernia.  Musculoskeletal: Degenerative changes of the lumbar spine.  IMPRESSION: 7.7 cm posterior right upper lobe mass which abuts the posterior chest wall/pleural surface, compatible with primary bronchogenic neoplasm.  9 mm short axis right hilar node, worrisome for nodal metastasis. Additional small right paratracheal and subcarinal nodes, indeterminate.  No evidence of metastatic disease in the abdomen/pelvis.  Additional ancillary findings as above.   Electronically Signed   By: Julian Hy M.Garcia.   On: 05/21/2014 13:21    Ct Biopsy  05/23/2014   CLINICAL DATA:  Right upper lobe mass.  Intracranial lesion.  EXAM: CT-GUIDED BIOPSY OF A RIGHT UPPER LOBE MASS.  CORE.  MEDICATIONS AND MEDICAL HISTORY: Versed 2 mg, Fentanyl 100 mcg.  Additional Medications: None.  ANESTHESIA/SEDATION: Moderate sedation time: 10 minutes  PROCEDURE: The procedure, risks, benefits, and alternatives were explained to the patient. Questions regarding the procedure were encouraged and answered. The patient understands and consents to the procedure.  The right upper posterior thorax was prepped with Betadine in a sterile fashion, and a sterile drape was applied covering the operative field. A sterile gown and sterile gloves were used for the procedure.  Under CT guidance, a(n) 17 gauge guide needle was advanced into the right upper lobe lung mass. Subsequently 3 18 gauge core biopsies were obtained. The guide needle was removed. Final imaging was performed.  Patient tolerated the procedure well without complication. Vital sign monitoring by nursing staff during the procedure will continue as patient is in the special procedures unit for post procedure observation.  FINDINGS: The images document guide needle placement within the right upper lobe lung mass. Post biopsy images demonstrate no pneumothorax.  COMPLICATIONS: None  IMPRESSION: Successful CT-guided right upper lobe lung mass core biopsy.   Electronically Signed   By: Marybelle Killings M.Garcia.   On: 05/23/2014 14:13   Dg Chest Port 1 View  05/23/2014   CLINICAL DATA:  Evaluate for pneumothorax after lung biopsy  EXAM: PORTABLE CHEST - 1 VIEW  COMPARISON:  None.  FINDINGS: The heart size and mediastinal contours are within normal limits. Right upper lobe lung mass is again noted. No pneumothorax identified. The visualized skeletal structures are unremarkable.  IMPRESSION: 1. No pneumothorax noted following lung biopsy.   Electronically Signed   By: Kerby Moors M.Garcia.   On: 05/23/2014 14:47     CBC  Recent Labs Lab 05/27/14 0635 05/28/14 0739 05/29/14 0602 05/30/14 0543 05/31/14 0550  WBC 13.8* 10.4 11.7* 9.5 8.9  HGB 16.4 16.0 16.6 15.6 15.2  HCT 45.5 45.2 46.3 44.8 43.6  PLT 153 132* 123* 103* 87*  MCV 89.9 90.6 90.3 90.7 92.0  MCH 32.4 32.1 32.4 31.6 32.1  MCHC 36.0 35.4 35.9 34.8 34.9  RDW 13.4 13.6 13.4 13.4 13.4  LYMPHSABS 2.5 1.4 1.9 1.3 0.9  MONOABS 1.0 0.9 0.8 0.8 0.8  EOSABS 0.0 0.0 0.0 0.0 0.0  BASOSABS 0.0 0.0 0.0 0.0 0.0    Chemistries   Recent Labs Lab 05/25/14 0805  NA 133*  K 3.8  CL 96*  CO2 26  GLUCOSE 268*  BUN 18  CREATININE 0.68  CALCIUM 9.5  AST 56*  ALT 132*  ALKPHOS 60  BILITOT 1.2   ------------------------------------------------------------------------------------------------------------------ estimated creatinine clearance is 118.6 mL/min (by C-G formula based on Cr of 0.68). ------------------------------------------------------------------------------------------------------------------ No results for input(s): HGBA1C in the last 72 hours. ------------------------------------------------------------------------------------------------------------------ No results for input(s): CHOL, HDL, LDLCALC, TRIG, CHOLHDL, LDLDIRECT in the last 72 hours. ------------------------------------------------------------------------------------------------------------------ No results for input(s): TSH, T4TOTAL, T3FREE, THYROIDAB in the last 72 hours.  Invalid input(s): FREET3 ------------------------------------------------------------------------------------------------------------------ No results for input(s): VITAMINB12, FOLATE, FERRITIN, TIBC, IRON, RETICCTPCT in the last 72 hours.  Coagulation profile No results for input(s): INR, PROTIME in the last 168 hours.  No results for input(s): DDIMER in the last 72 hours.  Cardiac Enzymes No results for input(s): CKMB, TROPONINI, MYOGLOBIN in the last 168 hours.  Invalid  input(s): CK ------------------------------------------------------------------------------------------------------------------ Invalid input(s): POCBNP    Alahia Whicker Garcia.O. on 05/31/2014 at 10:49 AM  Between 7am to 7pm - Pager - [phone]  After 7pm go to [email or web address] - password [email or web address]  And look for the night coverage person covering for me after hours  [facility] Office  [phone]

## 2014-05-31 NOTE — Progress Notes (Signed)
Filed Vitals:   05/31/14 0137 05/31/14 0500 05/31/14 0644 05/31/14 0930  BP: 154/95 150/90 137/94 156/90  Pulse: 53 60 59 62  Temp: 99.2 F (37.3 C) 99 F (37.2 C) 98.2 F (36.8 C) 98.8 F (37.1 C)  TempSrc: Oral Oral Oral Oral  Resp: '18 18  18  '$ Height:      Weight:      SpO2: 92% 94% 96% 96%    CBC  Recent Labs  05/30/14 0543 05/31/14 0550  WBC 9.5 8.9  HGB 15.6 15.2  HCT 44.8 43.6  PLT 103* 87*    Patient resting in bed, comfortable. Denies headache. He notes he's been up and out of bed to the chair, as well as ambulating in the halls with a staff.  Nursing staff notes mild diastolic hypertension. Patient has localized ecchymoses at injection sites for subcutaneous heparin.  Plan: Scheduled for stereotactic radiosurgery on May 9 and suboccipital craniotomy for tumor resection on May 10 with Dr. Kathyrn Sheriff. I've asked the nursing staff to speak with patient's hospitalist regarding hypertension and also question of discontinuing subcutaneous heparin in the light of upcoming cranial surgery and existing thrombocytopenia.  Hosie Spangle, MD 05/31/2014, 9:37 AM

## 2014-05-31 NOTE — Progress Notes (Signed)
Patient ambulated 100 feet with RN, tolerated well, 1 assist with walker. Patient had medium bowel movement, soft and formed. Patient returned to bed, SCD's placed back on patient. Patient is now resting in bed, call bell within reach, will continue to monitor closely.

## 2014-06-01 LAB — CBC WITH DIFFERENTIAL/PLATELET
BASOS ABS: 0 10*3/uL (ref 0.0–0.1)
BASOS PCT: 0 % (ref 0–1)
EOS ABS: 0 10*3/uL (ref 0.0–0.7)
Eosinophils Relative: 0 % (ref 0–5)
HCT: 44.2 % (ref 39.0–52.0)
Hemoglobin: 15.5 g/dL (ref 13.0–17.0)
LYMPHS ABS: 1.2 10*3/uL (ref 0.7–4.0)
Lymphocytes Relative: 12 % (ref 12–46)
MCH: 32 pg (ref 26.0–34.0)
MCHC: 35.1 g/dL (ref 30.0–36.0)
MCV: 91.3 fL (ref 78.0–100.0)
MONO ABS: 0.9 10*3/uL (ref 0.1–1.0)
Monocytes Relative: 9 % (ref 3–12)
Neutro Abs: 8.2 10*3/uL — ABNORMAL HIGH (ref 1.7–7.7)
Neutrophils Relative %: 79 % — ABNORMAL HIGH (ref 43–77)
PLATELETS: 84 10*3/uL — AB (ref 150–400)
RBC: 4.84 MIL/uL (ref 4.22–5.81)
RDW: 13.5 % (ref 11.5–15.5)
WBC: 10.4 10*3/uL (ref 4.0–10.5)

## 2014-06-01 MED ORDER — MECLIZINE HCL 12.5 MG PO TABS
25.0000 mg | ORAL_TABLET | Freq: Three times a day (TID) | ORAL | Status: DC | PRN
  Filled 2014-06-01: qty 1

## 2014-06-01 MED ORDER — OXYMETAZOLINE HCL 0.05 % NA SOLN
2.0000 | Freq: Once | NASAL | Status: DC
  Filled 2014-06-01: qty 15

## 2014-06-01 NOTE — Progress Notes (Signed)
[facility]                                                                              Patient Demographics  Ray Garcia, is a 54 y.o. male, DOB - 1960/01/31, HEN:[id number]  Admit date - 05/20/2014   Admitting Physician Lavina Hamman, MD  Outpatient Primary MD for the patient is [name], [name], MD  LOS - 12   No chief complaint on file.     HPI on 05/20/2014 by Dr. Shanda Howells This is a 54 y.o. year old male with significant past medical history of cirrhosis, hepatitis C, HTN, dental caries/abscess, ETOH abuse presenting with cerebellar mass. Patient presented today at [facility] with chief complaint of recurrent intermittent headaches as well as recurrent nausea and dizziness. States that symptoms have been fairly persistent for over the past 2 weeks. No reported head trauma. Reports only minimal alcohol intake. One to 2 beers per day. Previous heavy drinker. No hemiparesis or confusion. Also with noted dental caries and dental abscess. Is currently on oral penicillin for this. Presents his [facility] afebrile, hemodynamically stable. A head CT obtained that [facility] showed abnormal appearance of the left cerebellar hemisphere concerning for underlying mass lesion with associated edema. Per report, ER physician assistant discussed case with on-call neurosurgery at [facility] recommended medical admission with formal neurosurgical consultation. Labs are pending.   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 CT scan of lungs showing a 7.7 cm posterior right upper lobe mass, making this cerebellar mass lesion suspicious for metastasis -Radiation Onc and neurosurgery consulted consulted.  -Continue IV steroids- per neurosurgery, Dr. Kathyrn Sheriff, if non-small cell CA, may require resection of the cerebellar lesion  (note on 05/22/2014) -Spoke with Dr, Kathyrn Sheriff (on 05/27/2014) regarding results of biopsy (invavsive adenocarcinoma) -Patient scheduled for stereotatic radiosurgery on 06/02/2014 -Patient scheduled for suboccipital craniotomy for tumor resectoion on 06/03/2014 -Oncology, Dr. Alvy Bimler, consulted and appreciated, will follow up with patient on 5/19 as an outpatient  Headache/Dizziness -Like secondary to the above -Continue pain medication, patient feels his headache has improved slightly -Dizziness is improving.  -Will increase dose of meclizine.   Probable metastatic lung cancer -Patient has a 30-pack-year smoking history, CT chest showed 7.7 cm posterior right upper lobe mass compatible with primary bronchogenic neoplasm. -Patient underwent CT-guided biopsy 4/29, Invasive adenocarcinoma primary to lung -Treatment and plan as above   Hypertensin -Likely secondary to pain -Continue hydralazine PRN for SBP >150 or DBP >110  Nausea/vomiting. -Resolved -As mentioned above likely secondary to cerebellar mass lesion  Liver cirrhosis: Likely due to hepatitis C and chronic alcoholism -CT abdomen and pelvis showed no evidence of metastatic disease in the abdomen and pelvis.   Chronic alcoholism. -Patient reported that his last drink about a month ago, currently does not exhibit signs or symptoms of alcohol withdrawal  Essential hypertension  -Currently stable  Constipation -Resolved, continue colace  Leukocytosis -Resolved, Likely secondary to decadron -Continue to monitor  Thrombocytopenia -Platelets 84, Heparin discontinued, continue SCDs -Continue to monitor CBC -HIT pending  Dental  caries/abscess -Was on PCN as an outpatient -Discontinued clindamycin (received 10 days of antibiotics)  Code Status: Full  Family Communication: None at bedside  Disposition Plan: Admitted, pending further workup and SRS and resection on 5/9 and 5/10.  Time Spent in minutes   20  minutes  Procedures  CT guided biopsy- by IR  Consults   Pulmonology Interventional radiology Neurosurgery Oncology  DVT Prophylaxis  Heparin  Lab Results  Component Value Date   PLT 84* 06/01/2014    Medications  Scheduled Meds: . dexamethasone  4 mg Intravenous 3 times per day  . docusate sodium  100 mg Oral BID  . feeding supplement (ENSURE ENLIVE)  237 mL Oral BID BM  . folic acid  1 mg Oral Daily  . multivitamin with minerals  1 tablet Oral Daily  . polyethylene glycol  17 g Oral Daily  . saccharomyces boulardii  250 mg Oral BID  . thiamine  100 mg Oral Daily   Continuous Infusions:  PRN Meds:.hydrALAZINE, meclizine, morphine injection, ondansetron **OR** ondansetron (ZOFRAN) IV, oxyCODONE  Antibiotics    Anti-infectives    Start     Dose/Rate Route Frequency Ordered Stop   05/22/14 0015  clindamycin (CLEOCIN) capsule 300 mg  Status:  Discontinued     300 mg Oral 3 times per day 05/21/14 2359 05/29/14 1226   05/21/14 0645  penicillin v potassium (VEETID) tablet 500 mg  Status:  Discontinued     500 mg Oral 4 times per day 05/21/14 7672 05/21/14 2358      Subjective:   Ray Garcia seen and examined today.  Patient continues to complain of dizziness and headache, however feels they are improving.  Denies chest pain, shortness of breath or abdominal pain.    Objective:   Filed Vitals:   05/31/14 2043 06/01/14 0220 06/01/14 0711 06/01/14 1017  BP: 147/93 149/90 129/83 153/94  Pulse: 69 85 63 65  Temp: 98 F (36.7 C) 97.8 F (36.6 C) 97.7 F (36.5 C) 98 F (36.7 C)  TempSrc: Oral Oral Oral Oral  Resp: '18 20 18 18  '$ Height:      Weight:      SpO2: 96% 95% 98% 97%    Wt Readings from Last 3 Encounters:  05/20/14 92.8 kg (204 lb 9.4 oz)     Intake/Output Summary (Last 24 hours) at 06/01/14 1024 Last data filed at 06/01/14 1017  Gross per 24 hour  Intake    560 ml  Output   2175 ml  Net  -1615 ml    Exam  General: Well developed, well  nourished,  No apparent distress  Cardiovascular: Normal S1/S2, RRR, no murmurs  Respiratory: Clear to auscultation   Abdomen: Soft, nontender, nondistended, + bowel sounds  Extremities: warm dry without cyanosis clubbing or edema  Neuro: AAOx3, nonfocal  Skin: ecchymoses on the abdomen ([place] heparin)  Psych: Pleasant. Appropriate mood and affect  Data Review   Micro Results No results found for this or any previous visit (from the past 240 hour(s)).  Radiology Reports Ct Chest W Contrast  05/21/2014   CLINICAL DATA:  Cirrhosis, hepatitis C. Cerebellar mass. Lung mass on chest radiograph. Evaluate for metastatic disease.  EXAM: CT CHEST, ABDOMEN, AND PELVIS WITH CONTRAST  TECHNIQUE: Multidetector CT imaging of the chest, abdomen and pelvis was performed following the standard protocol during bolus administration of intravenous contrast.  CONTRAST:  140m OMNIPAQUE IOHEXOL 300 MG/ML  SOLN  COMPARISON:  Chest radiograph dated 05/20/2014.  FINDINGS: CT  CHEST FINDINGS  Mediastinum/Nodes: The heart is normal in size. No pericardial effusion.  Coronary atherosclerosis in the LAD.  9 mm short axis right hilar node (series 3/ image 23). Small right paratracheal nodes measuring up to 6 mm short axis (series 3/image 20). 7 mm short axis subcarinal node (series 3/ image 23).  No suspicious axillary or supraclavicular nodes.  Visualized thyroid is unremarkable.  Lungs/Pleura: Macrolobulated 6.6 x 7.7 x 6.4 cm mass in the posterior right upper lobe (series 4/image 14), which abuts the posterior chest wall/pleural surface, but does not demonstrate definite overlying osseous invasion.  No evidence of satellite nodularity or lymphangitic spread. Lungs are otherwise clear.  Mild dependent scarring/ atelectasis in the right lower lobe.  No pleural effusion or pneumothorax.  Musculoskeletal: Mild degenerative changes of the thoracic spine.  CT ABDOMEN PELVIS FINDINGS  Hepatobiliary: Cirrhosis. Mildly increased  subcapsular perfusion inferiorly in thea posterior segment right hepatic lobe (series 3/image 70), which normalizes on the delayed phase, without definite underlying mass.  Cholelithiasis (series 3/ image 66). Gallbladder is underdistended. No intrahepatic or extrahepatic ductal dilatation.  Pancreas: Within normal limits.  Spleen: Splenomegaly, measuring 15.9 cm in maximal craniocaudal dimension.  Adrenals/Urinary Tract: Adrenal glands are within normal limits.  Kidneys are within normal limits.  No hydronephrosis.  Layering excretory contrast (likely gadolinium) within the bladder.  Stomach/Bowel: Stomach is within normal limits.  No evidence of bowel obstruction.  Normal appendix.  Colonic diverticulosis, without evidence of diverticulitis.  Vascular/Lymphatic: Atherosclerotic calcifications of the abdominal aorta and branch vessels.  Small upper abdominal lymph nodes measuring up to 10 mm short axis, likely reactive.  Reproductive: Prostate is unremarkable.  Other: Moderate fat containing left inguinal/ scrotal hernia.  Musculoskeletal: Degenerative changes of the lumbar spine.  IMPRESSION: 7.7 cm posterior right upper lobe mass which abuts the posterior chest wall/pleural surface, compatible with primary bronchogenic neoplasm.  9 mm short axis right hilar node, worrisome for nodal metastasis. Additional small right paratracheal and subcarinal nodes, indeterminate.  No evidence of metastatic disease in the abdomen/pelvis.  Additional ancillary findings as above.   Electronically Signed   By: Julian Hy M.D.   On: 05/21/2014 13:21   Mr Jeri Cos MG Contrast  05/27/2014   CLINICAL DATA:  Metastatic disease.  Lung mass.  EXAM: MRI HEAD WITHOUT AND WITH CONTRAST  TECHNIQUE: Multiplanar, multiecho pulse sequences of the brain and surrounding structures were obtained without and with intravenous contrast.  CONTRAST:  56m MULTIHANCE GADOBENATE DIMEGLUMINE 529 MG/ML IV SOLN  COMPARISON:  MRI head 05/21/2014   FINDINGS: SRS protocol at 3 Tesla performed.  Enhancing mass lesion in the left cerebellar tonsil measures 33 x 26 x 37 mm and is unchanged in size. The mass shows diffuse enhancement. There may be some minimal internal hemorrhage. The white matter edema has improved since the prior study due to steroid treatment. There remains mass-effect on the fourth ventricle which is flattened and displaced slightly to the right. No hydrocephalus. The mass extends into the left posterior lateral medulla were there is some edema and enhancing tumor.  No other enhancing mass lesions identified.  Ventricle size is normal. Negative for acute infarct. No significant chronic ischemia.  Paranasal sinuses clear neck  The patient was not able to hold still and some of the images are degraded by motion  IMPRESSION: 33 x 26 x 27 mm left cerebellar tonsillar mass is unchanged in size. There is invasion of the posterior lateral medulla on the left. Surrounding white matter  edema has improved following treatment with steroids. This appears to be a solitary metastatic deposit.   Electronically Signed   By: Franchot Gallo M.D.   On: 05/27/2014 19:28   Mr Jeri Cos QI Contrast  05/21/2014   CLINICAL DATA:  Dizziness with nausea and vomiting. Known brain mass.  EXAM: MRI HEAD WITHOUT AND WITH CONTRAST  TECHNIQUE: Multiplanar, multiecho pulse sequences of the brain and surrounding structures were obtained without and with intravenous contrast.  CONTRAST:  68m MULTIHANCE GADOBENATE DIMEGLUMINE 529 MG/ML IV SOLN  COM[facility]  [facility]  FINDINGS: Calvarium and upper cervical spine: No focal marrow signal abnormality.  Orbits: No significant findings.  Sinuses: Retention cysts in the adenoid. No paranasal sinus or mastoid obstruction.  Brain: There is diffusely enhancing mass located in the lower left cerebellum with growth across the inferior cerebellar peduncle into the dorsal lateral medulla. The mass  measures 32 x 25 x 37 mm. T2 hypo intensity and partial diffusion restriction suggests dense cellularity. There could be minimal blood products internally, but no measurable hematoma. No additional mass lesion is seen. Vasogenic edema surrounds the mass, with crowding in the posterior fossa and upper displacement of the cerebellum. The lower fourth ventricle it is partially effaced, but there is no hydrocephalus.  No infarct, major vessel occlusion, for significant white matter disease.  IMPRESSION: 1. 32 x 25 x 37 mm densely cellular left cerebellar mass which extends into the left inferior peduncle and medulla. Given patient's right upper lobe mass, this is most consistent with a solitary metastasis. 2. Vasogenic edema partially effaces the lower fourth ventricle. No hydrocephalus.   Electronically Signed   By: JMonte FantasiaM.D.   On: 05/21/2014 12:04   Ct Abdomen Pelvis W Contrast  05/21/2014   CLINICAL DATA:  Cirrhosis, hepatitis C. Cerebellar mass. Lung mass on chest radiograph. Evaluate for metastatic disease.  EXAM: CT CHEST, ABDOMEN, AND PELVIS WITH CONTRAST  TECHNIQUE: Multidetector CT imaging of the chest, abdomen and pelvis was performed following the standard protocol during bolus administration of intravenous contrast.  CONTRAST:  1011mOMNIPAQUE IOHEXOL 300 MG/ML  SOLN  COMPARISON:  Chest radiograph dated 05/20/2014.  FINDINGS: CT CHEST FINDINGS  Mediastinum/Nodes: The heart is normal in size. No pericardial effusion.  Coronary atherosclerosis in the LAD.  9 mm short axis right hilar node (series 3/ image 23). Small right paratracheal nodes measuring up to 6 mm short axis (series 3/image 20). 7 mm short axis subcarinal node (series 3/ image 23).  No suspicious axillary or supraclavicular nodes.  Visualized thyroid is unremarkable.  Lungs/Pleura: Macrolobulated 6.6 x 7.7 x 6.4 cm mass in the posterior right upper lobe (series 4/image 14), which abuts the posterior chest wall/pleural surface, but  does not demonstrate definite overlying osseous invasion.  No evidence of satellite nodularity or lymphangitic spread. Lungs are otherwise clear.  Mild dependent scarring/ atelectasis in the right lower lobe.  No pleural effusion or pneumothorax.  Musculoskeletal: Mild degenerative changes of the thoracic spine.  CT ABDOMEN PELVIS FINDINGS  Hepatobiliary: Cirrhosis. Mildly increased subcapsular perfusion inferiorly in thea posterior segment right hepatic lobe (series 3/image 70), which normalizes on the delayed phase, without definite underlying mass.  Cholelithiasis (series 3/ image 66). Gallbladder is underdistended. No intrahepatic or extrahepatic ductal dilatation.  Pancreas: Within normal limits.  Spleen: Splenomegaly, measuring 15.9 cm in maximal craniocaudal dimension.  Adrenals/Urinary Tract: Adrenal glands are within normal limits.  Kidneys are within normal limits.  No  hydronephrosis.  Layering excretory contrast (likely gadolinium) within the bladder.  Stomach/Bowel: Stomach is within normal limits.  No evidence of bowel obstruction.  Normal appendix.  Colonic diverticulosis, without evidence of diverticulitis.  Vascular/Lymphatic: Atherosclerotic calcifications of the abdominal aorta and branch vessels.  Small upper abdominal lymph nodes measuring up to 10 mm short axis, likely reactive.  Reproductive: Prostate is unremarkable.  Other: Moderate fat containing left inguinal/ scrotal hernia.  Musculoskeletal: Degenerative changes of the lumbar spine.  IMPRESSION: 7.7 cm posterior right upper lobe mass which abuts the posterior chest wall/pleural surface, compatible with primary bronchogenic neoplasm.  9 mm short axis right hilar node, worrisome for nodal metastasis. Additional small right paratracheal and subcarinal nodes, indeterminate.  No evidence of metastatic disease in the abdomen/pelvis.  Additional ancillary findings as above.   Electronically Signed   By: Julian Hy M.D.   On: 05/21/2014  13:21   Ct Biopsy  05/23/2014   CLINICAL DATA:  Right upper lobe mass.  Intracranial lesion.  EXAM: CT-GUIDED BIOPSY OF A RIGHT UPPER LOBE MASS.  CORE.  MEDICATIONS AND MEDICAL HISTORY: Versed 2 mg, Fentanyl 100 mcg.  Additional Medications: None.  ANESTHESIA/SEDATION: Moderate sedation time: 10 minutes  PROCEDURE: The procedure, risks, benefits, and alternatives were explained to the patient. Questions regarding the procedure were encouraged and answered. The patient understands and consents to the procedure.  The right upper posterior thorax was prepped with Betadine in a sterile fashion, and a sterile drape was applied covering the operative field. A sterile gown and sterile gloves were used for the procedure.  Under CT guidance, a(n) 17 gauge guide needle was advanced into the right upper lobe lung mass. Subsequently 3 18 gauge core biopsies were obtained. The guide needle was removed. Final imaging was performed.  Patient tolerated the procedure well without complication. Vital sign monitoring by nursing staff during the procedure will continue as patient is in the special procedures unit for post procedure observation.  FINDINGS: The images document guide needle placement within the right upper lobe lung mass. Post biopsy images demonstrate no pneumothorax.  COMPLICATIONS: None  IMPRESSION: Successful CT-guided right upper lobe lung mass core biopsy.   Electronically Signed   By: Marybelle Killings M.D.   On: 05/23/2014 14:13   Dg Chest Port 1 View  05/23/2014   CLINICAL DATA:  Evaluate for pneumothorax after lung biopsy  EXAM: PORTABLE CHEST - 1 VIEW  COMPARISON:  None.  FINDINGS: The heart size and mediastinal contours are within normal limits. Right upper lobe lung mass is again noted. No pneumothorax identified. The visualized skeletal structures are unremarkable.  IMPRESSION: 1. No pneumothorax noted following lung biopsy.   Electronically Signed   By: Kerby Moors M.D.   On: 05/23/2014 14:47     CBC  Recent Labs Lab 05/28/14 0739 05/29/14 0602 05/30/14 0543 05/31/14 0550 06/01/14 0543  WBC 10.4 11.7* 9.5 8.9 10.4  HGB 16.0 16.6 15.6 15.2 15.5  HCT 45.2 46.3 44.8 43.6 44.2  PLT 132* 123* 103* 87* 84*  MCV 90.6 90.3 90.7 92.0 91.3  MCH 32.1 32.4 31.6 32.1 32.0  MCHC 35.4 35.9 34.8 34.9 35.1  RDW 13.6 13.4 13.4 13.4 13.5  LYMPHSABS 1.4 1.9 1.3 0.9 1.2  MONOABS 0.9 0.8 0.8 0.8 0.9  EOSABS 0.0 0.0 0.0 0.0 0.0  BASOSABS 0.0 0.0 0.0 0.0 0.0    Chemistries  No results for input(s): NA, K, CL, CO2, GLUCOSE, BUN, CREATININE, CALCIUM, MG, AST, ALT, ALKPHOS, BILITOT in the last  168 hours.  Invalid input(s): GFRCGP ------------------------------------------------------------------------------------------------------------------ estimated creatinine clearance is 118.6 mL/min (by C-G formula based on Cr of 0.68). ------------------------------------------------------------------------------------------------------------------ No results for input(s): HGBA1C in the last 72 hours. ------------------------------------------------------------------------------------------------------------------ No results for input(s): CHOL, HDL, LDLCALC, TRIG, CHOLHDL, LDLDIRECT in the last 72 hours. ------------------------------------------------------------------------------------------------------------------ No results for input(s): TSH, T4TOTAL, T3FREE, THYROIDAB in the last 72 hours.  Invalid input(s): FREET3 ------------------------------------------------------------------------------------------------------------------ No results for input(s): VITAMINB12, FOLATE, FERRITIN, TIBC, IRON, RETICCTPCT in the last 72 hours.  Coagulation profile No results for input(s): INR, PROTIME in the last 168 hours.  No results for input(s): DDIMER in the last 72 hours.  Cardiac Enzymes No results for input(s): CKMB, TROPONINI, MYOGLOBIN in the last 168 hours.  Invalid input(s):  CK ------------------------------------------------------------------------------------------------------------------ Invalid input(s): POCBNP    Cherica Heiden D.O. on 06/01/2014 at 10:24 AM  Between 7am to 7pm - Pager - [phone]  After 7pm go to [email or web address] - password [email or web address]  And look for the night coverage person covering for me after hours  [facility] Office  [phone]

## 2014-06-01 NOTE — Progress Notes (Signed)
Patient ID: Ray Garcia, male   DOB: [birth year], 54 y.o.   MRN: [id number] Subjective:  The patient is alert and pleasant. He has a mild headache. He is in no apparent distress.  Objective: Vital signs in last 24 hours: Temp:  [97.7 F (36.5 C)-98.8 F (37.1 C)] 97.7 F (36.5 C) (05/08 0711) Pulse Rate:  [62-85] 63 (05/08 0711) Resp:  [18-20] 18 (05/08 0711) BP: (129-156)/(83-93) 129/83 mmHg (05/08 0711) SpO2:  [95 %-98 %] 98 % (05/08 0711)  Intake/Output from previous day: 05/07 0701 - 05/08 0700 In: 520 [P.O.:520] Out: 1900 [Urine:1900] Intake/Output this shift:    Physical exam the patient is alert and oriented 3. He is moving all 4 extremities well.  Lab Results:  Recent Labs  05/31/14 0550 06/01/14 0543  WBC 8.9 10.4  HGB 15.2 15.5  HCT 43.6 44.2  PLT 87* 84*   BMET No results for input(s): NA, K, CL, CO2, GLUCOSE, BUN, CREATININE, CALCIUM in the last 72 hours.  Studies/Results: No results found.  Assessment/Plan: Cerebellar metastasis: The patient is doing better clinically with less headache and nausea. He is tentatively scheduled for radiosurgery and a craniectomy with tumor resection next week.  Thrombocytopenia: Noted  LOS: 12 days     Icel Castles D 06/01/2014, 7:33 AM

## 2014-06-01 NOTE — Progress Notes (Signed)
No further bleeding noted from nare. Order for afrin nasal spray to administer if bleeding continues.

## 2014-06-01 NOTE — Progress Notes (Signed)
Patient having a nose bleed from right nare. V/S T98.2 P 90 R 18 B/P150/91 gauze in right nare and ice to forehead provider paged to make aware.

## 2014-06-02 ENCOUNTER — Encounter: Payer: Self-pay | Admitting: Radiation Oncology

## 2014-06-02 ENCOUNTER — Ambulatory Visit: Payer: MEDICAID | Admitting: Radiation Oncology

## 2014-06-02 ENCOUNTER — Ambulatory Visit
Admit: 2014-06-02 | Discharge: 2014-06-02 | Disposition: A | Discharge status: Home / Self Care | Payer: MEDICAID | Attending: Radiation Oncology | Admitting: Radiation Oncology

## 2014-06-02 VITALS — BP 157/99 | HR 61 | Temp 97.7°F | Resp 18

## 2014-06-02 DIAGNOSIS — C7931 Secondary malignant neoplasm of brain: Secondary | ICD-10-CM

## 2014-06-02 LAB — BASIC METABOLIC PANEL
Anion gap: 10 (ref 5–15)
BUN: 27 mg/dL — AB (ref 6–20)
CALCIUM: 9.3 mg/dL (ref 8.9–10.3)
CO2: 25 mmol/L (ref 22–32)
CREATININE: 0.7 mg/dL (ref 0.61–1.24)
Chloride: 96 mmol/L — ABNORMAL LOW (ref 101–111)
GFR calc Af Amer: >60 mL/min (ref >60)
GLUCOSE: 450 mg/dL — AB (ref 70–99)
Potassium: 4.4 mmol/L (ref 3.5–5.1)
SODIUM: 131 mmol/L — AB (ref 135–145)

## 2014-06-02 LAB — CBC WITH DIFFERENTIAL/PLATELET
BASOS PCT: 0 % (ref 0–1)
Basophils Absolute: 0 10*3/uL (ref 0.0–0.1)
EOS ABS: 0 10*3/uL (ref 0.0–0.7)
Eosinophils Relative: 0 % (ref 0–5)
HCT: 45.6 % (ref 39.0–52.0)
HEMOGLOBIN: 16 g/dL (ref 13.0–17.0)
LYMPHS ABS: 1 10*3/uL (ref 0.7–4.0)
Lymphocytes Relative: 8 % — ABNORMAL LOW (ref 12–46)
MCH: 32.3 pg (ref 26.0–34.0)
MCHC: 35.1 g/dL (ref 30.0–36.0)
MCV: 92.1 fL (ref 78.0–100.0)
MONOS PCT: 6 % (ref 3–12)
Monocytes Absolute: 0.7 10*3/uL (ref 0.1–1.0)
Neutro Abs: 10 10*3/uL — ABNORMAL HIGH (ref 1.7–7.7)
Neutrophils Relative %: 85 % — ABNORMAL HIGH (ref 43–77)
Platelets: 88 10*3/uL — ABNORMAL LOW (ref 150–400)
RBC: 4.95 MIL/uL (ref 4.22–5.81)
RDW: 13.5 % (ref 11.5–15.5)
WBC: 11.7 10*3/uL — ABNORMAL HIGH (ref 4.0–10.5)

## 2014-06-02 LAB — HEPARIN INDUCED THROMBOCYTOPENIA PNL: HEPARIN INDUCED PLT AB: 0.206 {OD_unit} (ref 0.000–0.400)

## 2014-06-02 MED ORDER — PROMETHAZINE HCL 25 MG/ML IJ SOLN
25.0000 mg | Freq: Four times a day (QID) | INTRAMUSCULAR | Status: DC | PRN
  Administered 2014-06-03: 12.5 mg via INTRAVENOUS
  Filled 2014-06-02: qty 1

## 2014-06-02 NOTE — Progress Notes (Signed)
Patient nauseated from ride here via care link.given zofran 4 mg po prior to [facility] procedure.

## 2014-06-02 NOTE — Progress Notes (Signed)
Patient arrived back to room from [facility]  Ave Filter, RN

## 2014-06-02 NOTE — Progress Notes (Signed)
  Radiation Oncology         [phone]) [phone] ________________________________  Inpatient  Stereotactic Treatment Procedure Note  Name: Ray Garcia MRN: [id number]  Date: 06/02/2014  DOB: 02-01-1960  SPECIAL TREATMENT PROCEDURE  No diagnosis found.  3D TREATMENT PLANNING AND DOSIMETRY:  The patient's radiation plan was reviewed and approved by neurosurgery and radiation oncology prior to treatment.  It showed 3-dimensional radiation distributions overlaid onto the planning CT/MRI image set.  The [facility] for the target structures as well as the organs at risk were reviewed. The documentation of the 3D plan and dosimetry are filed in the radiation oncology EMR.  NARRATIVE:  Ray Garcia was brought to the TrueBeam stereotactic radiation treatment machine and placed supine on the CT couch. The head frame was applied, and the patient was set up for stereotactic radiosurgery.  Neurosurgery was present for the set-up and delivery  SIMULATION VERIFICATION:  In the couch zero-angle position, the patient underwent Exactrac imaging using the Brainlab system with orthogonal KV images.  These were carefully aligned and repeated to confirm treatment position for each of the isocenters.  The Exactrac snap film verification was repeated at each couch angle.  SPECIAL TREATMENT PROCEDURE: Ray Garcia received stereotactic radiosurgery to the following targets: Left cerebellar 62m target was treated using 5 Rapid Arc VMAT Beams to a prescription dose of 16 Gy.  ExacTrac registration was performed for each couch angle.  The 100% isodose line was prescribed.  6 MV X-rays were delivered in the flattening filter free beam mode.  STEREOTACTIC TREATMENT MANAGEMENT:  Following delivery, the patient was transported to nursing in stable condition and monitored for possible acute effects.  Vital signs were recorded. The patient tolerated treatment without significant acute effects, and was discharged to home in stable  condition.    PLAN: Follow-up in one month.  ________________________________  MSheral Apley MTammi Klippel M.D.  This document serves as a record of services personally performed by MTyler Pita MD. It was created on his behalf by ADerek Mound a trained medical scribe. The creation of this record is based on the scribe's personal observations and the provider's statements to them. This document has been checked and approved by the attending provider.

## 2014-06-02 NOTE — Progress Notes (Signed)
Called and confirmed with [facility] for appointment at 1210 at [facility].   Ave Filter, RN

## 2014-06-02 NOTE — Progress Notes (Signed)
Patient left unit to [facility] at this time. Will return by [facility].   Ave Filter, RN

## 2014-06-02 NOTE — Progress Notes (Signed)
[facility]                                                                              Patient Demographics  Ray Garcia, is a 54 y.o. male, DOB - Jun 18, 1960, [id number]  Admit date - 05/20/2014   Admitting Physician Lavina Hamman, MD  Outpatient Primary MD for the patient is [name], [name], MD  LOS - 13   No chief complaint on file.     HPI on 05/20/2014 by Dr. Shanda Howells This is a 54 y.o. year old male with significant past medical history of cirrhosis, hepatitis C, HTN, dental caries/abscess, ETOH abuse presenting with cerebellar mass. Patient presented today at [facility] with chief complaint of recurrent intermittent headaches as well as recurrent nausea and dizziness. States that symptoms have been fairly persistent for over the past 2 weeks. No reported head trauma. Reports only minimal alcohol intake. One to 2 beers per day. Previous heavy drinker. No hemiparesis or confusion. Also with noted dental caries and dental abscess. Is currently on oral penicillin for this. Presents his [facility] afebrile, hemodynamically stable. A head CT obtained that [facility] showed abnormal appearance of the left cerebellar hemisphere concerning for underlying mass lesion with associated edema. Per report, ER physician assistant discussed case with on-call neurosurgery at [facility] recommended medical admission with formal neurosurgical consultation. Labs are pending.   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 CT scan of lungs showing a 7.7 cm posterior right upper lobe mass, making this cerebellar mass lesion suspicious for metastasis -Radiation Onc and neurosurgery consulted consulted.  -Continue IV steroids- per neurosurgery, Dr. Kathyrn Sheriff, if non-small cell CA, may require resection of the cerebellar lesion  (note on 05/22/2014) -Spoke with Dr, Kathyrn Sheriff (on 05/27/2014) regarding results of biopsy (invavsive adenocarcinoma) -Patient scheduled for stereotatic radiosurgery on 06/02/2014 -Patient scheduled for suboccipital craniotomy for tumor resectoion on 06/03/2014 -Oncology, Dr. Alvy Bimler, consulted and appreciated, will follow up with patient on 5/19 as an outpatient  Headache/Dizziness -Like secondary to the above -Continue pain medication, patient feels his headache has improved slightly -Dizziness is improving.  -Continue meclizine '25mg'$  PRN  Probable metastatic lung cancer -Patient has a 30-pack-year smoking history, CT chest showed 7.7 cm posterior right upper lobe mass compatible with primary bronchogenic neoplasm. 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 -Resolved -As mentioned above likely secondary to cerebellar mass lesion  Liver cirrhosis: Likely due to hepatitis C and chronic alcoholism -CT abdomen and pelvis showed no evidence of metastatic disease in the abdomen and pelvis.   Chronic alcoholism. -Patient reported that his last drink about a month ago, currently does not exhibit signs or symptoms of alcohol withdrawal  Essential hypertension  -Currently stable  Constipation -Resolved, continue colace  Leukocytosis -Resolved, Likely secondary to decadron -Continue to monitor  Thrombocytopenia -Platelets 88, Heparin discontinued, continue SCDs -Continue to monitor CBC -HIT pending  Dental caries/abscess -Was  on PCN as an outpatient -Discontinued clindamycin (received 10 days of antibiotics)  Mild Epistaxis -resolved -Afrin is bleeding restarts  Code Status: Full  Family Communication: None at bedside. Wife via phone  Disposition Plan: Admitted,  [place] 06/02/2014 and resection   06/03/2014.  Time Spent in minutes   20 minutes  Procedures  CT guided biopsy- by IR  Consults   Pulmonology Interventional radiology Neurosurgery Oncology  DVT Prophylaxis  Heparin  Lab Results  Component Value Date   PLT 88* 06/02/2014    Medications  Scheduled Meds: . dexamethasone  4 mg Intravenous 3 times per day  . docusate sodium  100 mg Oral BID  . feeding supplement (ENSURE ENLIVE)  237 mL Oral BID BM  . folic acid  1 mg Oral Daily  . multivitamin with minerals  1 tablet Oral Daily  . oxymetazoline  2 spray Right Nare Once  . polyethylene glycol  17 g Oral Daily  . saccharomyces boulardii  250 mg Oral BID  . thiamine  100 mg Oral Daily   Continuous Infusions:  PRN Meds:.hydrALAZINE, meclizine, morphine injection, ondansetron **OR** ondansetron (ZOFRAN) IV, oxyCODONE  Antibiotics    Anti-infectives    Start     Dose/Rate Route Frequency Ordered Stop   05/22/14 0015  clindamycin (CLEOCIN) capsule 300 mg  Status:  Discontinued     300 mg Oral 3 times per day 05/21/14 2359 05/29/14 1226   05/21/14 0645  penicillin v potassium (VEETID) tablet 500 mg  Status:  Discontinued     500 mg Oral 4 times per day 05/21/14 9833 05/21/14 2358      Subjective:   Ray Garcia seen and examined today.  Patient's syncope is anxious about his upcoming treatment however is ready for it. Denies any chest pain or shortness of breath. The stapes had problems speaking since being diagnosed with cancer. Patient denies any abdominal pain. Patient denies any further nose bleeding. He feels that it could be related to his cavities and dental abscess.  Objective:   Filed Vitals:   06/01/14 2100 06/02/14 0202 06/02/14 0618 06/02/14 0958  BP: 150/91 153/90 142/87 151/89  Pulse: 90 76 56 66  Temp: 98.2 F (36.8 C) 98.2 F (36.8 C) 97.5 F (36.4 C) 98 F (36.7 C)  TempSrc: Oral Oral Oral Oral  Resp: '18 20 20 20  '$ Height:      Weight:      SpO2: 95% 97% 94% 96%    Wt Readings  from Last 3 Encounters:  05/20/14 92.8 kg (204 lb 9.4 oz)     Intake/Output Summary (Last 24 hours) at 06/02/14 1055 Last data filed at 06/02/14 0959  Gross per 24 hour  Intake    717 ml  Output   1700 ml  Net   -983 ml    Exam  General: Well developed, well nourished  Cardiovascular: Normal S1/S2, RRR, no murmurs  Respiratory: Clear to auscultation   Abdomen: Soft, nontender, nondistended, + bowel sounds  Extremities: warm dry without cyanosis clubbing or edema  Neuro: AAOx3, nonfocal  Skin: ecchymoses on the abdomen ([place] heparin)  Psych: Pleasant, normal mood and affect  Data Review   Micro Results No results found for this or any previous visit (from the past 240 hour(s)).  Radiology Reports Ct Chest W Contrast  05/21/2014   CLINICAL DATA:  Cirrhosis, hepatitis C. Cerebellar mass. Lung mass on chest radiograph. Evaluate for metastatic disease.  EXAM: CT CHEST, ABDOMEN, AND PELVIS WITH CONTRAST  TECHNIQUE:  Multidetector CT imaging of the chest, abdomen and pelvis was performed following the standard protocol during bolus administration of intravenous contrast.  CONTRAST:  188m OMNIPAQUE IOHEXOL 300 MG/ML  SOLN  COMPARISON:  Chest radiograph dated 05/20/2014.  FINDINGS: CT CHEST FINDINGS  Mediastinum/Nodes: The heart is normal in size. No pericardial effusion.  Coronary atherosclerosis in the LAD.  9 mm short axis right hilar node (series 3/ image 23). Small right paratracheal nodes measuring up to 6 mm short axis (series 3/image 20). 7 mm short axis subcarinal node (series 3/ image 23).  No suspicious axillary or supraclavicular nodes.  Visualized thyroid is unremarkable.  Lungs/Pleura: Macrolobulated 6.6 x 7.7 x 6.4 cm mass in the posterior right upper lobe (series 4/image 14), which abuts the posterior chest wall/pleural surface, but does not demonstrate definite overlying osseous invasion.  No evidence of satellite nodularity or lymphangitic spread. Lungs are otherwise  clear.  Mild dependent scarring/ atelectasis in the right lower lobe.  No pleural effusion or pneumothorax.  Musculoskeletal: Mild degenerative changes of the thoracic spine.  CT ABDOMEN PELVIS FINDINGS  Hepatobiliary: Cirrhosis. Mildly increased subcapsular perfusion inferiorly in thea posterior segment right hepatic lobe (series 3/image 70), which normalizes on the delayed phase, without definite underlying mass.  Cholelithiasis (series 3/ image 66). Gallbladder is underdistended. No intrahepatic or extrahepatic ductal dilatation.  Pancreas: Within normal limits.  Spleen: Splenomegaly, measuring 15.9 cm in maximal craniocaudal dimension.  Adrenals/Urinary Tract: Adrenal glands are within normal limits.  Kidneys are within normal limits.  No hydronephrosis.  Layering excretory contrast (likely gadolinium) within the bladder.  Stomach/Bowel: Stomach is within normal limits.  No evidence of bowel obstruction.  Normal appendix.  Colonic diverticulosis, without evidence of diverticulitis.  Vascular/Lymphatic: Atherosclerotic calcifications of the abdominal aorta and branch vessels.  Small upper abdominal lymph nodes measuring up to 10 mm short axis, likely reactive.  Reproductive: Prostate is unremarkable.  Other: Moderate fat containing left inguinal/ scrotal hernia.  Musculoskeletal: Degenerative changes of the lumbar spine.  IMPRESSION: 7.7 cm posterior right upper lobe mass which abuts the posterior chest wall/pleural surface, compatible with primary bronchogenic neoplasm.  9 mm short axis right hilar node, worrisome for nodal metastasis. Additional small right paratracheal and subcarinal nodes, indeterminate.  No evidence of metastatic disease in the abdomen/pelvis.  Additional ancillary findings as above.   Electronically Signed   By: SJulian HyM.D.   On: 05/21/2014 13:21   Mr BJeri CosWATContrast  05/27/2014   CLINICAL DATA:  Metastatic disease.  Lung mass.  EXAM: MRI HEAD WITHOUT AND WITH CONTRAST   TECHNIQUE: Multiplanar, multiecho pulse sequences of the brain and surrounding structures were obtained without and with intravenous contrast.  CONTRAST:  268mMULTIHANCE GADOBENATE DIMEGLUMINE 529 MG/ML IV SOLN  COMPARISON:  MRI head 05/21/2014  FINDINGS: SRS protocol at 3 Tesla performed.  Enhancing mass lesion in the left cerebellar tonsil measures 33 x 26 x 37 mm and is unchanged in size. The mass shows diffuse enhancement. There may be some minimal internal hemorrhage. The white matter edema has improved since the prior study due to steroid treatment. There remains mass-effect on the fourth ventricle which is flattened and displaced slightly to the right. No hydrocephalus. The mass extends into the left posterior lateral medulla were there is some edema and enhancing tumor.  No other enhancing mass lesions identified.  Ventricle size is normal. Negative for acute infarct. No significant chronic ischemia.  Paranasal sinuses clear neck  The patient was not able to  hold still and some of the images are degraded by motion  IMPRESSION: 33 x 26 x 27 mm left cerebellar tonsillar mass is unchanged in size. There is invasion of the posterior lateral medulla on the left. Surrounding white matter edema has improved following treatment with steroids. This appears to be a solitary metastatic deposit.   Electronically Signed   By: Franchot Gallo M.D.   On: 05/27/2014 19:28   Mr Jeri Cos ZS Contrast  05/21/2014   CLINICAL DATA:  Dizziness with nausea and vomiting. Known brain mass.  EXAM: MRI HEAD WITHOUT AND WITH CONTRAST  TECHNIQUE: Multiplanar, multiecho pulse sequences of the brain and surrounding structures were obtained without and with intravenous contrast.  CONTRAST:  60m MULTIHANCE GADOBENATE DIMEGLUMINE 529 MG/ML IV SOLN  [facility]  FINDINGS: Calvarium and upper cervical spine: No focal marrow signal abnormality.  Orbits: No significant findings.  Sinuses:  Retention cysts in the adenoid. No paranasal sinus or mastoid obstruction.  Brain: There is diffusely enhancing mass located in the lower left cerebellum with growth across the inferior cerebellar peduncle into the dorsal lateral medulla. The mass measures 32 x 25 x 37 mm. T2 hypo intensity and partial diffusion restriction suggests dense cellularity. There could be minimal blood products internally, but no measurable hematoma. No additional mass lesion is seen. Vasogenic edema surrounds the mass, with crowding in the posterior fossa and upper displacement of the cerebellum. The lower fourth ventricle it is partially effaced, but there is no hydrocephalus.  No infarct, major vessel occlusion, for significant white matter disease.  IMPRESSION: 1. 32 x 25 x 37 mm densely cellular left cerebellar mass which extends into the left inferior peduncle and medulla. Given patient's right upper lobe mass, this is most consistent with a solitary metastasis. 2. Vasogenic edema partially effaces the lower fourth ventricle. No hydrocephalus.   Electronically Signed   By: JMonte FantasiaM.D.   On: 05/21/2014 12:04   Ct Abdomen Pelvis W Contrast  05/21/2014   CLINICAL DATA:  Cirrhosis, hepatitis C. Cerebellar mass. Lung mass on chest radiograph. Evaluate for metastatic disease.  EXAM: CT CHEST, ABDOMEN, AND PELVIS WITH CONTRAST  TECHNIQUE: Multidetector CT imaging of the chest, abdomen and pelvis was performed following the standard protocol during bolus administration of intravenous contrast.  CONTRAST:  1075mOMNIPAQUE IOHEXOL 300 MG/ML  SOLN  COMPARISON:  Chest radiograph dated 05/20/2014.  FINDINGS: CT CHEST FINDINGS  Mediastinum/Nodes: The heart is normal in size. No pericardial effusion.  Coronary atherosclerosis in the LAD.  9 mm short axis right hilar node (series 3/ image 23). Small right paratracheal nodes measuring up to 6 mm short axis (series 3/image 20). 7 mm short axis subcarinal node (series 3/ image 23).  No  suspicious axillary or supraclavicular nodes.  Visualized thyroid is unremarkable.  Lungs/Pleura: Macrolobulated 6.6 x 7.7 x 6.4 cm mass in the posterior right upper lobe (series 4/image 14), which abuts the posterior chest wall/pleural surface, but does not demonstrate definite overlying osseous invasion.  No evidence of satellite nodularity or lymphangitic spread. Lungs are otherwise clear.  Mild dependent scarring/ atelectasis in the right lower lobe.  No pleural effusion or pneumothorax.  Musculoskeletal: Mild degenerative changes of the thoracic spine.  CT ABDOMEN PELVIS FINDINGS  Hepatobiliary: Cirrhosis. Mildly increased subcapsular perfusion inferiorly in thea posterior segment right hepatic lobe (series 3/image 70), which normalizes on the delayed phase, without definite underlying mass.  Cholelithiasis (series 3/ image 66).  Gallbladder is underdistended. No intrahepatic or extrahepatic ductal dilatation.  Pancreas: Within normal limits.  Spleen: Splenomegaly, measuring 15.9 cm in maximal craniocaudal dimension.  Adrenals/Urinary Tract: Adrenal glands are within normal limits.  Kidneys are within normal limits.  No hydronephrosis.  Layering excretory contrast (likely gadolinium) within the bladder.  Stomach/Bowel: Stomach is within normal limits.  No evidence of bowel obstruction.  Normal appendix.  Colonic diverticulosis, without evidence of diverticulitis.  Vascular/Lymphatic: Atherosclerotic calcifications of the abdominal aorta and branch vessels.  Small upper abdominal lymph nodes measuring up to 10 mm short axis, likely reactive.  Reproductive: Prostate is unremarkable.  Other: Moderate fat containing left inguinal/ scrotal hernia.  Musculoskeletal: Degenerative changes of the lumbar spine.  IMPRESSION: 7.7 cm posterior right upper lobe mass which abuts the posterior chest wall/pleural surface, compatible with primary bronchogenic neoplasm.  9 mm short axis right hilar node, worrisome for nodal  metastasis. Additional small right paratracheal and subcarinal nodes, indeterminate.  No evidence of metastatic disease in the abdomen/pelvis.  Additional ancillary findings as above.   Electronically Signed   By: Julian Hy M.D.   On: 05/21/2014 13:21   Ct Biopsy  05/23/2014   CLINICAL DATA:  Right upper lobe mass.  Intracranial lesion.  EXAM: CT-GUIDED BIOPSY OF A RIGHT UPPER LOBE MASS.  CORE.  MEDICATIONS AND MEDICAL HISTORY: Versed 2 mg, Fentanyl 100 mcg.  Additional Medications: None.  ANESTHESIA/SEDATION: Moderate sedation time: 10 minutes  PROCEDURE: The procedure, risks, benefits, and alternatives were explained to the patient. Questions regarding the procedure were encouraged and answered. The patient understands and consents to the procedure.  The right upper posterior thorax was prepped with Betadine in a sterile fashion, and a sterile drape was applied covering the operative field. A sterile gown and sterile gloves were used for the procedure.  Under CT guidance, a(n) 17 gauge guide needle was advanced into the right upper lobe lung mass. Subsequently 3 18 gauge core biopsies were obtained. The guide needle was removed. Final imaging was performed.  Patient tolerated the procedure well without complication. Vital sign monitoring by nursing staff during the procedure will continue as patient is in the special procedures unit for post procedure observation.  FINDINGS: The images document guide needle placement within the right upper lobe lung mass. Post biopsy images demonstrate no pneumothorax.  COMPLICATIONS: None  IMPRESSION: Successful CT-guided right upper lobe lung mass core biopsy.   Electronically Signed   By: Marybelle Killings M.D.   On: 05/23/2014 14:13   Dg Chest Port 1 View  05/23/2014   CLINICAL DATA:  Evaluate for pneumothorax after lung biopsy  EXAM: PORTABLE CHEST - 1 VIEW  COMPARISON:  None.  FINDINGS: The heart size and mediastinal contours are within normal limits. Right upper  lobe lung mass is again noted. No pneumothorax identified. The visualized skeletal structures are unremarkable.  IMPRESSION: 1. No pneumothorax noted following lung biopsy.   Electronically Signed   By: Kerby Moors M.D.   On: 05/23/2014 14:47    CBC  Recent Labs Lab 05/29/14 0602 05/30/14 0543 05/31/14 0550 06/01/14 0543 06/02/14 0630  WBC 11.7* 9.5 8.9 10.4 11.7*  HGB 16.6 15.6 15.2 15.5 16.0  HCT 46.3 44.8 43.6 44.2 45.6  PLT 123* 103* 87* 84* 88*  MCV 90.3 90.7 92.0 91.3 92.1  MCH 32.4 31.6 32.1 32.0 32.3  MCHC 35.9 34.8 34.9 35.1 35.1  RDW 13.4 13.4 13.4 13.5 13.5  LYMPHSABS 1.9 1.3 0.9 1.2 1.0  MONOABS 0.8 0.8 0.8  0.9 0.7  EOSABS 0.0 0.0 0.0 0.0 0.0  BASOSABS 0.0 0.0 0.0 0.0 0.0    Chemistries   Recent Labs Lab 06/02/14 0630  NA 131*  K 4.4  CL 96*  CO2 25  GLUCOSE 450*  BUN 27*  CREATININE 0.70  CALCIUM 9.3   ------------------------------------------------------------------------------------------------------------------ estimated creatinine clearance is 118.6 mL/min (by C-G formula based on Cr of 0.7). ------------------------------------------------------------------------------------------------------------------ No results for input(s): HGBA1C in the last 72 hours. ------------------------------------------------------------------------------------------------------------------ No results for input(s): CHOL, HDL, LDLCALC, TRIG, CHOLHDL, LDLDIRECT in the last 72 hours. ------------------------------------------------------------------------------------------------------------------ No results for input(s): TSH, T4TOTAL, T3FREE, THYROIDAB in the last 72 hours.  Invalid input(s): FREET3 ------------------------------------------------------------------------------------------------------------------ No results for input(s): VITAMINB12, FOLATE, FERRITIN, TIBC, IRON, RETICCTPCT in the last 72 hours.  Coagulation profile No results for input(s): INR,  PROTIME in the last 168 hours.  No results for input(s): DDIMER in the last 72 hours.  Cardiac Enzymes No results for input(s): CKMB, TROPONINI, MYOGLOBIN in the last 168 hours.  Invalid input(s): CK ------------------------------------------------------------------------------------------------------------------ Invalid input(s): POCBNP    Lincoln Kleiner D.O. on 06/02/2014 at 10:55 AM  Between 7am to 7pm - Pager - [phone]  After 7pm go to [email or web address] - password [email or web address]  And look for the night coverage person covering for me after hours  [facility] Office  [phone]

## 2014-06-02 NOTE — Addendum Note (Signed)
Encounter addended by: Heywood Footman, RN on: 06/02/2014  3:01 PM<BR>     Documentation filed: Vitals Section

## 2014-06-02 NOTE — Progress Notes (Signed)
Patient was seen by Rn vomiting. Attempted assisting and also to medicate patient but refused. Will keep monitoring.

## 2014-06-02 NOTE — Progress Notes (Signed)
PT Cancellation Note  Patient Details Name: Ray Garcia MRN: [id number] DOB: Oct 10, 1960   Cancelled Treatment:    Reason Eval/Treat Not Completed: Medical issues which prohibited therapy.  Pt with increased nausea and dizziness this am and very upset stating that staff keep leaving the overhead light on, which makes his symptoms worse.  Pt request hold PT at this time.  Will need new orders once pt appropriate for mobility post-op.     Mechille Varghese, Thornton Papas 06/02/2014, 9:32 AM

## 2014-06-02 NOTE — Op Note (Signed)
Name: Ray Garcia    MRN: [id number]   Date: 06/02/2014    DOB: 1960-08-15   STEREOTACTIC RADIOSURGERY OPERATIVE NOTE  PRE-OPERATIVE DIAGNOSIS:  Metastatic lung CA  POST-OPERATIVE DIAGNOSIS:  Same  PROCEDURE:  Stereotactic Radiosurgery  SURGEON:  Consuella Lose, MD  RADIATION ONCOLOGIST: Dr. Tyler Pita, MD  TECHNIQUE:  The patient underwent a radiation treatment planning session in the radiation oncology simulation suite under the care of the radiation oncology physician and physicist.  I participated closely in the radiation treatment planning afterwards. The patient underwent planning CT which was fused to 3T high resolution MRI with 1 mm axial slices.  These images were fused on the planning system.  Radiation oncology contoured the gross target volume and subsequently expanded this to yield the Planning Target Volume. I actively participated in the planning process.  I helped to define and review the target contours and also the contours of the optic pathway, eyes, brainstem and selected nearby organs at risk.  All the dose constraints for critical structures were reviewed and compared to AAPM Task Group 101.  The prescription dose conformity was reviewed.  I approved the plan electronically.    Accordingly, [name] was brought to the TrueBeam stereotactic radiation treatment linac and placed in the custom immobilization mask.  The patient was aligned according to the IR fiducial markers with Brain[employer] Exactrac, then orthogonal x-rays were used in ExacTrac with the 6DOF robotic table and the shifts were made to align the patient  This lesion was complex because it was 3.5 cm or greater and within the brainstem.  [name] received stereotactic radiosurgery uneventfully to a prescription dose of 16Gy to the solitary left tonsillar/medullary metastasis.  The detailed description of the procedure is recorded in the radiation oncology procedure note.  I was present for the  duration of the procedure.  DISPOSITION:  Following delivery, the patient was transported to nursing in stable condition and monitored for possible acute effects to be discharged to home in stable condition with follow-up in one month.  Consuella Lose, MD [facility]

## 2014-06-02 NOTE — Progress Notes (Signed)
Inpatient Diabetes Program Recommendations  AACE/ADA: New Consensus Statement on Inpatient Glycemic Control (2013)  Target Ranges:  Prepandial:   less than 140 mg/dL      Peak postprandial:   less than 180 mg/dL (1-2 hours)      Critically ill patients:  140 - 180 mg/dL   Results for Ray Garcia, Ray Garcia (MRN [id number]) as of 06/02/2014 14:24  Ref. Range 06/02/2014 06:30  Glucose Latest Ref Range: 70-99 mg/dL 450 (H)   Diabetes history: None Current orders for Inpatient glycemic control: None  Inpatient Diabetes Program Recommendations  Patient experiencing steroid induced hyperlgycemia Insulin - Basal: Lab glucose this am was 450 mg /dl. While inpatient on IV dexamethasone, please consider ordering Lantus 10 units Q24 hrs. Correction (SSI): Lab glucose elevated. Please order CBG's and Novolog 0-15 units ACHS/Q4 hrs if NPO while inpatient.  Thanks,  Tama Headings RN, MSN, [facility] Inpatient Diabetes Coordinator Team Pager [phone]

## 2014-06-03 ENCOUNTER — Inpatient Hospital Stay (HOSPITAL_COMMUNITY): Payer: Medicaid Other

## 2014-06-03 ENCOUNTER — Inpatient Hospital Stay (HOSPITAL_COMMUNITY): Payer: Medicaid Other | Admitting: Certified Registered"

## 2014-06-03 ENCOUNTER — Encounter (HOSPITAL_COMMUNITY)
Admission: AD | Disposition: A | Discharge status: Hospice | Payer: Self-pay | Source: Other Acute Inpatient Hospital | Attending: Neurosurgery

## 2014-06-03 ENCOUNTER — Encounter (HOSPITAL_COMMUNITY): Payer: Self-pay | Admitting: Certified Registered"

## 2014-06-03 DIAGNOSIS — R42 Dizziness and giddiness: Secondary | ICD-10-CM

## 2014-06-03 DIAGNOSIS — R51 Headache: Secondary | ICD-10-CM

## 2014-06-03 HISTORY — PX: APPLICATION OF CRANIAL NAVIGATION: SHX6578

## 2014-06-03 HISTORY — PX: CRANIECTOMY: SHX331

## 2014-06-03 LAB — GLUCOSE, CAPILLARY
GLUCOSE-CAPILLARY: 220 mg/dL — AB (ref 70–99)
GLUCOSE-CAPILLARY: 212 mg/dL — AB (ref 70–99)
Glucose-Capillary: 180 mg/dL — ABNORMAL HIGH (ref 70–99)

## 2014-06-03 LAB — CBC WITH DIFFERENTIAL/PLATELET
Basophils Absolute: 0 10*3/uL (ref 0.0–0.1)
Basophils Relative: 0 % (ref 0–1)
Eosinophils Absolute: 0 10*3/uL (ref 0.0–0.7)
Eosinophils Relative: 0 % (ref 0–5)
HCT: 46.7 % (ref 39.0–52.0)
HEMOGLOBIN: 16.4 g/dL (ref 13.0–17.0)
LYMPHS PCT: 15 % (ref 12–46)
Lymphs Abs: 1.9 10*3/uL (ref 0.7–4.0)
MCH: 32.2 pg (ref 26.0–34.0)
MCHC: 35.1 g/dL (ref 30.0–36.0)
MCV: 91.7 fL (ref 78.0–100.0)
MONOS PCT: 8 % (ref 3–12)
Monocytes Absolute: 1 10*3/uL (ref 0.1–1.0)
NEUTROS ABS: 9.8 10*3/uL — AB (ref 1.7–7.7)
Neutrophils Relative %: 77 % (ref 43–77)
Platelets: 97 10*3/uL — ABNORMAL LOW (ref 150–400)
RBC: 5.09 MIL/uL (ref 4.22–5.81)
RDW: 13.5 % (ref 11.5–15.5)
WBC: 12.7 10*3/uL — ABNORMAL HIGH (ref 4.0–10.5)

## 2014-06-03 LAB — POCT I-STAT 4, (NA,K, GLUC, HGB,HCT)
Glucose, Bld: 279 mg/dL — ABNORMAL HIGH (ref 70–99)
Glucose, Bld: 246 mg/dL — ABNORMAL HIGH (ref 70–99)
HCT: 45 % (ref 39.0–52.0)
HEMATOCRIT: 46 % (ref 39.0–52.0)
HEMOGLOBIN: 15.6 g/dL (ref 13.0–17.0)
HEMOGLOBIN: 15.3 g/dL (ref 13.0–17.0)
Potassium: 4.5 mmol/L (ref 3.5–5.1)
Potassium: 4.8 mmol/L (ref 3.5–5.1)
Sodium: 133 mmol/L — ABNORMAL LOW (ref 135–145)
Sodium: 133 mmol/L — ABNORMAL LOW (ref 135–145)

## 2014-06-03 LAB — SURGICAL PCR SCREEN
MRSA, PCR: NEGATIVE
Staphylococcus aureus: NEGATIVE

## 2014-06-03 SURGERY — COMPUTER-ASSISTED NAVIGATION, FOR CRANIAL PROCEDURE
Anesthesia: General

## 2014-06-03 MED ORDER — ROCURONIUM BROMIDE 50 MG/5ML IV SOLN
INTRAVENOUS | Status: AC
  Filled 2014-06-03: qty 1

## 2014-06-03 MED ORDER — HYDROMORPHONE HCL 1 MG/ML IJ SOLN
INTRAMUSCULAR | Status: AC
Start: 2014-06-03 — End: 2014-06-03
  Filled 2014-06-03: qty 2

## 2014-06-03 MED ORDER — PROPOFOL 10 MG/ML IV BOLUS
INTRAVENOUS | Status: DC | PRN
  Administered 2014-06-03: 200 mg via INTRAVENOUS
  Administered 2014-06-03 (×2): 50 mg via INTRAVENOUS

## 2014-06-03 MED ORDER — FENTANYL CITRATE (PF) 250 MCG/5ML IJ SOLN
INTRAMUSCULAR | Status: AC
  Filled 2014-06-03: qty 5

## 2014-06-03 MED ORDER — FENTANYL CITRATE (PF) 100 MCG/2ML IJ SOLN
INTRAMUSCULAR | Status: DC | PRN
  Administered 2014-06-03 (×2): 100 ug via INTRAVENOUS
  Administered 2014-06-03: 50 ug via INTRAVENOUS
  Administered 2014-06-03: 150 ug via INTRAVENOUS
  Administered 2014-06-03: 50 ug via INTRAVENOUS
  Administered 2014-06-03 (×2): 150 ug via INTRAVENOUS
  Administered 2014-06-03 (×2): 100 ug via INTRAVENOUS
  Administered 2014-06-03: 150 ug via INTRAVENOUS

## 2014-06-03 MED ORDER — MANNITOL 25 % IV SOLN
INTRAVENOUS | Status: DC | PRN
  Administered 2014-06-03 (×2): 12.5 g via INTRAVENOUS

## 2014-06-03 MED ORDER — SODIUM CHLORIDE 0.9 % IJ SOLN
INTRAMUSCULAR | Status: AC
  Filled 2014-06-03: qty 10

## 2014-06-03 MED ORDER — VECURONIUM BROMIDE 10 MG IV SOLR
INTRAVENOUS | Status: DC | PRN
  Administered 2014-06-03 (×8): 2 mg via INTRAVENOUS

## 2014-06-03 MED ORDER — LABETALOL HCL 5 MG/ML IV SOLN
10.0000 mg | INTRAVENOUS | Status: DC | PRN
  Administered 2014-06-03: 20 mg via INTRAVENOUS
  Administered 2014-06-03: 10 mg via INTRAVENOUS
  Administered 2014-06-03: 20 mg via INTRAVENOUS
  Administered 2014-06-03: 10 mg via INTRAVENOUS
  Administered 2014-06-04: 40 mg via INTRAVENOUS
  Filled 2014-06-03 (×3): qty 8

## 2014-06-03 MED ORDER — OXYCODONE HCL 5 MG PO TABS: 5.0000 mg | ORAL_TABLET | Freq: Once | ORAL | Status: DC | PRN

## 2014-06-03 MED ORDER — STERILE WATER FOR INJECTION IJ SOLN
INTRAMUSCULAR | Status: AC
  Filled 2014-06-03: qty 10

## 2014-06-03 MED ORDER — ONDANSETRON HCL 4 MG/2ML IJ SOLN: 4.0000 mg | Freq: Four times a day (QID) | INTRAMUSCULAR | Status: DC | PRN

## 2014-06-03 MED ORDER — ARTIFICIAL TEARS OP OINT
TOPICAL_OINTMENT | OPHTHALMIC | Status: AC
  Filled 2014-06-03: qty 3.5

## 2014-06-03 MED ORDER — PROPOFOL 10 MG/ML IV BOLUS
INTRAVENOUS | Status: AC
  Filled 2014-06-03: qty 20

## 2014-06-03 MED ORDER — ONDANSETRON HCL 4 MG/2ML IJ SOLN
INTRAMUSCULAR | Status: AC
  Filled 2014-06-03: qty 2

## 2014-06-03 MED ORDER — LIDOCAINE-EPINEPHRINE 1 %-1:100000 IJ SOLN
INTRAMUSCULAR | Status: DC | PRN
  Administered 2014-06-03: 10 mL

## 2014-06-03 MED ORDER — EPHEDRINE SULFATE 50 MG/ML IJ SOLN
INTRAMUSCULAR | Status: AC
  Filled 2014-06-03: qty 1

## 2014-06-03 MED ORDER — ONDANSETRON HCL 4 MG/2ML IJ SOLN
INTRAMUSCULAR | Status: DC | PRN
  Administered 2014-06-03: 4 mg via INTRAVENOUS

## 2014-06-03 MED ORDER — FOLIC ACID 1 MG PO TABS: 1.0000 mg | ORAL_TABLET | Freq: Every day | ORAL | Status: DC

## 2014-06-03 MED ORDER — SODIUM CHLORIDE 0.9 % IV BOLUS (SEPSIS)
500.0000 mL | Freq: Once | INTRAVENOUS | Status: AC
  Administered 2014-06-03: 500 mL via INTRAVENOUS

## 2014-06-03 MED ORDER — GLYCOPYRROLATE 0.2 MG/ML IJ SOLN
INTRAMUSCULAR | Status: DC | PRN
  Administered 2014-06-03: .8 mg via INTRAVENOUS

## 2014-06-03 MED ORDER — SODIUM CHLORIDE 0.9 % IR SOLN
Status: DC | PRN
  Administered 2014-06-03: 10:00:00

## 2014-06-03 MED ORDER — VANCOMYCIN HCL IN DEXTROSE 1-5 GM/200ML-% IV SOLN
INTRAVENOUS | Status: AC
  Administered 2014-06-03: 1000 mg via INTRAVENOUS
  Filled 2014-06-03: qty 200

## 2014-06-03 MED ORDER — SODIUM CHLORIDE 0.9 % IV SOLN
INTRAVENOUS | Status: DC
  Administered 2014-06-03: 01:00:00 via INTRAVENOUS

## 2014-06-03 MED ORDER — INSULIN ASPART 100 UNIT/ML ~~LOC~~ SOLN
0.0000 [IU] | SUBCUTANEOUS | Status: DC
  Administered 2014-06-03: 3 [IU] via SUBCUTANEOUS
  Administered 2014-06-03: 5 [IU] via SUBCUTANEOUS
  Administered 2014-06-04 (×2): 3 [IU] via SUBCUTANEOUS
  Administered 2014-06-04: 2 [IU] via SUBCUTANEOUS
  Administered 2014-06-04 (×2): 3 [IU] via SUBCUTANEOUS
  Administered 2014-06-04 – 2014-06-05 (×2): 5 [IU] via SUBCUTANEOUS
  Administered 2014-06-05 (×4): 3 [IU] via SUBCUTANEOUS
  Administered 2014-06-06: 5 [IU] via SUBCUTANEOUS
  Administered 2014-06-06 (×2): 3 [IU] via SUBCUTANEOUS
  Administered 2014-06-06: 2 [IU] via SUBCUTANEOUS
  Administered 2014-06-06: 3 [IU] via SUBCUTANEOUS
  Administered 2014-06-07: 5 [IU] via SUBCUTANEOUS
  Administered 2014-06-07 (×2): 3 [IU] via SUBCUTANEOUS
  Administered 2014-06-07: 5 [IU] via SUBCUTANEOUS
  Administered 2014-06-07: 3 [IU] via SUBCUTANEOUS
  Administered 2014-06-07 – 2014-06-08 (×2): 5 [IU] via SUBCUTANEOUS
  Administered 2014-06-08: 2 [IU] via SUBCUTANEOUS
  Administered 2014-06-08: 5 [IU] via SUBCUTANEOUS
  Administered 2014-06-08 (×2): 3 [IU] via SUBCUTANEOUS
  Administered 2014-06-08: 2 [IU] via SUBCUTANEOUS
  Administered 2014-06-09 (×4): 3 [IU] via SUBCUTANEOUS
  Administered 2014-06-09: 2 [IU] via SUBCUTANEOUS
  Administered 2014-06-09: 5 [IU] via SUBCUTANEOUS
  Administered 2014-06-10 (×2): 3 [IU] via SUBCUTANEOUS
  Administered 2014-06-10: 2 [IU] via SUBCUTANEOUS
  Administered 2014-06-10 (×2): 3 [IU] via SUBCUTANEOUS
  Administered 2014-06-10: 11 [IU] via SUBCUTANEOUS
  Administered 2014-06-11: 3 [IU] via SUBCUTANEOUS
  Administered 2014-06-11: 8 [IU] via SUBCUTANEOUS
  Administered 2014-06-11: 5 [IU] via SUBCUTANEOUS
  Administered 2014-06-11: 2 [IU] via SUBCUTANEOUS
  Administered 2014-06-11: 5 [IU] via SUBCUTANEOUS
  Administered 2014-06-11: 2 [IU] via SUBCUTANEOUS
  Administered 2014-06-12: 3 [IU] via SUBCUTANEOUS
  Administered 2014-06-12 (×2): 2 [IU] via SUBCUTANEOUS
  Administered 2014-06-12: 3 [IU] via SUBCUTANEOUS
  Administered 2014-06-12: 2 [IU] via SUBCUTANEOUS
  Administered 2014-06-12: 8 [IU] via SUBCUTANEOUS
  Administered 2014-06-12: 3 [IU] via SUBCUTANEOUS
  Administered 2014-06-13: 2 [IU] via SUBCUTANEOUS
  Administered 2014-06-13: 8 [IU] via SUBCUTANEOUS
  Administered 2014-06-13: 5 [IU] via SUBCUTANEOUS
  Administered 2014-06-13: 3 [IU] via SUBCUTANEOUS
  Administered 2014-06-14 (×5): 2 [IU] via SUBCUTANEOUS
  Administered 2014-06-14 – 2014-06-15 (×2): 5 [IU] via SUBCUTANEOUS
  Administered 2014-06-15 (×4): 3 [IU] via SUBCUTANEOUS
  Administered 2014-06-16: 2 [IU] via SUBCUTANEOUS
  Filled 2014-06-03 (×99): qty 0.15

## 2014-06-03 MED ORDER — VECURONIUM BROMIDE 10 MG IV SOLR
INTRAVENOUS | Status: AC
  Filled 2014-06-03: qty 10

## 2014-06-03 MED ORDER — SODIUM CHLORIDE 0.9 % IV SOLN
INTRAVENOUS | Status: DC | PRN
  Administered 2014-06-03 (×3): via INTRAVENOUS

## 2014-06-03 MED ORDER — SODIUM CHLORIDE 0.9 % IV SOLN
INTRAVENOUS | Status: DC
  Administered 2014-06-03 – 2014-06-04 (×2): via INTRAVENOUS
  Administered 2014-06-04: 75 mL/h via INTRAVENOUS
  Administered 2014-06-05 – 2014-06-09 (×7): via INTRAVENOUS

## 2014-06-03 MED ORDER — SUCCINYLCHOLINE CHLORIDE 20 MG/ML IJ SOLN
INTRAMUSCULAR | Status: AC
  Filled 2014-06-03: qty 1

## 2014-06-03 MED ORDER — HYDROMORPHONE HCL 1 MG/ML IJ SOLN
1.0000 mg | INTRAMUSCULAR | Status: DC | PRN
  Administered 2014-06-03 – 2014-06-05 (×14): 2 mg via INTRAVENOUS
  Administered 2014-06-06: 1 mg via INTRAVENOUS
  Administered 2014-06-06: 2 mg via INTRAVENOUS
  Administered 2014-06-06: 1 mg via INTRAVENOUS
  Administered 2014-06-06: 2 mg via INTRAVENOUS
  Administered 2014-06-06: 1 mg via INTRAVENOUS
  Administered 2014-06-06: 2 mg via INTRAVENOUS
  Administered 2014-06-07: 1 mg via INTRAVENOUS
  Administered 2014-06-07 – 2014-06-09 (×9): 2 mg via INTRAVENOUS
  Filled 2014-06-03 (×2): qty 2
  Filled 2014-06-03: qty 1
  Filled 2014-06-03 (×14): qty 2
  Filled 2014-06-03: qty 1
  Filled 2014-06-03 (×7): qty 2
  Filled 2014-06-03: qty 1
  Filled 2014-06-03 (×3): qty 2

## 2014-06-03 MED ORDER — 0.9 % SODIUM CHLORIDE (POUR BTL) OPTIME
TOPICAL | Status: DC | PRN
  Administered 2014-06-03 (×3): 1000 mL

## 2014-06-03 MED ORDER — LIDOCAINE HCL (CARDIAC) 20 MG/ML IV SOLN
INTRAVENOUS | Status: AC
  Filled 2014-06-03: qty 5

## 2014-06-03 MED ORDER — BUPIVACAINE HCL (PF) 0.5 % IJ SOLN
INTRAMUSCULAR | Status: DC | PRN
  Administered 2014-06-03: 10 mL

## 2014-06-03 MED ORDER — BACITRACIN ZINC 500 UNIT/GM EX OINT
TOPICAL_OINTMENT | CUTANEOUS | Status: DC | PRN
  Administered 2014-06-03: 1 via TOPICAL

## 2014-06-03 MED ORDER — VANCOMYCIN HCL IN DEXTROSE 1-5 GM/200ML-% IV SOLN
1000.0000 mg | Freq: Three times a day (TID) | INTRAVENOUS | Status: AC
  Administered 2014-06-03 – 2014-06-04 (×3): 1000 mg via INTRAVENOUS
  Filled 2014-06-03 (×5): qty 200

## 2014-06-03 MED ORDER — ARTIFICIAL TEARS OP OINT
TOPICAL_OINTMENT | OPHTHALMIC | Status: DC | PRN
Start: 2014-06-03 — End: 2014-06-03
  Administered 2014-06-03: 1 via OPHTHALMIC

## 2014-06-03 MED ORDER — INSULIN GLARGINE 100 UNIT/ML ~~LOC~~ SOLN
10.0000 [IU] | Freq: Every day | SUBCUTANEOUS | Status: DC
  Administered 2014-06-03 – 2014-06-09 (×7): 10 [IU] via SUBCUTANEOUS
  Filled 2014-06-03 (×8): qty 0.1

## 2014-06-03 MED ORDER — THROMBIN 5000 UNITS EX SOLR
OROMUCOSAL | Status: DC | PRN
  Administered 2014-06-03 (×2): via TOPICAL

## 2014-06-03 MED ORDER — ROCURONIUM BROMIDE 100 MG/10ML IV SOLN
INTRAVENOUS | Status: DC | PRN
  Administered 2014-06-03: 50 mg via INTRAVENOUS

## 2014-06-03 MED ORDER — THROMBIN 20000 UNITS EX SOLR
CUTANEOUS | Status: DC | PRN
  Administered 2014-06-03: 10:00:00 via TOPICAL

## 2014-06-03 MED ORDER — LABETALOL HCL 5 MG/ML IV SOLN
INTRAVENOUS | Status: AC
  Filled 2014-06-03: qty 4

## 2014-06-03 MED ORDER — GADOBENATE DIMEGLUMINE 529 MG/ML IV SOLN
20.0000 mL | Freq: Once | INTRAVENOUS | Status: AC | PRN
  Administered 2014-06-03: 20 mL via INTRAVENOUS

## 2014-06-03 MED ORDER — DEXAMETHASONE SODIUM PHOSPHATE 4 MG/ML IJ SOLN
4.0000 mg | Freq: Three times a day (TID) | INTRAMUSCULAR | Status: DC
  Administered 2014-06-03 – 2014-06-04 (×3): 4 mg via INTRAVENOUS
  Filled 2014-06-03 (×4): qty 1

## 2014-06-03 MED ORDER — PROMETHAZINE HCL 25 MG PO TABS: 12.5000 mg | ORAL_TABLET | ORAL | Status: DC | PRN

## 2014-06-03 MED ORDER — LIDOCAINE HCL (CARDIAC) 20 MG/ML IV SOLN
INTRAVENOUS | Status: DC | PRN
  Administered 2014-06-03: 60 mg via INTRAVENOUS

## 2014-06-03 MED ORDER — OXYCODONE HCL 5 MG/5ML PO SOLN: 5.0000 mg | Freq: Once | ORAL | Status: DC | PRN

## 2014-06-03 MED ORDER — FENTANYL CITRATE (PF) 100 MCG/2ML IJ SOLN: 25.0000 ug | INTRAMUSCULAR | Status: DC | PRN

## 2014-06-03 MED ORDER — SUCCINYLCHOLINE CHLORIDE 20 MG/ML IJ SOLN
INTRAMUSCULAR | Status: DC | PRN
  Administered 2014-06-03: 110 mg via INTRAVENOUS

## 2014-06-03 MED ORDER — GLYCOPYRROLATE 0.2 MG/ML IJ SOLN
INTRAMUSCULAR | Status: AC
  Filled 2014-06-03: qty 4

## 2014-06-03 SURGICAL SUPPLY — 104 items
BAG DECANTER FOR FLEXI CONT (MISCELLANEOUS) ×4 IMPLANT
BENZOIN TINCTURE PRP APPL 2/3 (GAUZE/BANDAGES/DRESSINGS) IMPLANT
BLADE CLIPPER SURG (BLADE) ×4 IMPLANT
BLADE SAW GIGLI 16 STRL (MISCELLANEOUS) IMPLANT
BLADE SURG 15 STRL LF DISP TIS (BLADE) ×2 IMPLANT
BLADE SURG 15 STRL SS (BLADE) ×2
BLADE ULTRA TIP 2M (BLADE) ×4 IMPLANT
BNDG GAUZE ELAST 4 BULKY (GAUZE/BANDAGES/DRESSINGS) IMPLANT
BUR ACORN 6.0 PRECISION (BURR) ×3 IMPLANT
BUR ACORN 6.0MM PRECISION (BURR) ×1
BUR ADDG 1.1 (BURR) IMPLANT
BUR ADDG 1.1MM (BURR)
BUR MATCHSTICK NEURO 3.0 LAGG (BURR) IMPLANT
BUR ROUND FLUTED 4 SOFT TCH (BURR) IMPLANT
BUR ROUND FLUTED 4MM SOFT TCH (BURR)
BUR ROUND FLUTED 5 RND (BURR) ×3 IMPLANT
BUR ROUND FLUTED 5MM RND (BURR) ×1
BUR ROUTER D-58 CRANI (BURR) IMPLANT
CANISTER SUCT 3000ML PPV (MISCELLANEOUS) ×4 IMPLANT
CATH VENTRIC 35X38 W/TROCAR LG (CATHETERS) IMPLANT
CLIP TI MEDIUM 6 (CLIP) ×8 IMPLANT
CONT SPEC 4OZ CLIKSEAL STRL BL (MISCELLANEOUS) ×8 IMPLANT
COVER MAYO STAND STRL (DRAPES) IMPLANT
DECANTER SPIKE VIAL GLASS SM (MISCELLANEOUS) ×4 IMPLANT
DERMABOND ADVANCED (GAUZE/BANDAGES/DRESSINGS) ×2
DERMABOND ADVANCED .7 DNX12 (GAUZE/BANDAGES/DRESSINGS) ×2 IMPLANT
DRAIN SNY WOU 7FLT (WOUND CARE) IMPLANT
DRAIN SUBARACHNOID (WOUND CARE) IMPLANT
DRAPE MICROSCOPE LEICA (MISCELLANEOUS) ×4 IMPLANT
DRAPE NEUROLOGICAL W/INCISE (DRAPES) ×4 IMPLANT
DRAPE PROXIMA HALF (DRAPES) ×8 IMPLANT
DRAPE STERI IOBAN 125X83 (DRAPES) IMPLANT
DRAPE SURG 17X23 STRL (DRAPES) IMPLANT
DRAPE WARM FLUID 44X44 (DRAPE) ×4 IMPLANT
DRSG OPSITE POSTOP 4X8 (GAUZE/BANDAGES/DRESSINGS) ×4 IMPLANT
DRSG TELFA 3X8 NADH (GAUZE/BANDAGES/DRESSINGS) ×4 IMPLANT
DURAMATRIX ONLAY 2X2 (Neuro Prosthesis/Implant) ×8 IMPLANT
DURAPREP 6ML APPLICATOR 50/CS (WOUND CARE) ×4 IMPLANT
ELECT CAUTERY BLADE 6.4 (BLADE) ×4 IMPLANT
ELECT REM PT RETURN 9FT ADLT (ELECTROSURGICAL) ×4
ELECTRODE REM PT RTRN 9FT ADLT (ELECTROSURGICAL) ×2 IMPLANT
EVACUATOR 1/8 PVC DRAIN (DRAIN) IMPLANT
EVACUATOR SILICONE 100CC (DRAIN) IMPLANT
FORCEPS BIPOLAR SPETZLER 8 1.0 (NEUROSURGERY SUPPLIES) ×4 IMPLANT
GAUZE SPONGE 4X4 12PLY STRL (GAUZE/BANDAGES/DRESSINGS) ×4 IMPLANT
GAUZE SPONGE 4X4 16PLY XRAY LF (GAUZE/BANDAGES/DRESSINGS) IMPLANT
GLOVE ECLIPSE 6.5 STRL STRAW (GLOVE) ×4 IMPLANT
GLOVE ECLIPSE 7.5 STRL STRAW (GLOVE) IMPLANT
GLOVE EXAM NITRILE LRG STRL (GLOVE) IMPLANT
GLOVE EXAM NITRILE MD LF STRL (GLOVE) IMPLANT
GLOVE EXAM NITRILE XL STR (GLOVE) IMPLANT
GLOVE EXAM NITRILE XS STR PU (GLOVE) IMPLANT
GLOVE INDICATOR 7.0 STRL GRN (GLOVE) ×8 IMPLANT
GLOVE INDICATOR 7.5 STRL GRN (GLOVE) ×4 IMPLANT
GLOVE SURG SS PI 7.0 STRL IVOR (GLOVE) ×12 IMPLANT
GOWN STRL REUS W/ TWL LRG LVL3 (GOWN DISPOSABLE) ×2 IMPLANT
GOWN STRL REUS W/ TWL XL LVL3 (GOWN DISPOSABLE) ×2 IMPLANT
GOWN STRL REUS W/TWL 2XL LVL3 (GOWN DISPOSABLE) IMPLANT
GOWN STRL REUS W/TWL LRG LVL3 (GOWN DISPOSABLE) ×2
GOWN STRL REUS W/TWL XL LVL3 (GOWN DISPOSABLE) ×2
HEMOSTAT POWDER KIT SURGIFOAM (HEMOSTASIS) ×8 IMPLANT
HEMOSTAT SURGICEL 2X14 (HEMOSTASIS) IMPLANT
KIT BASIN OR (CUSTOM PROCEDURE TRAY) ×4 IMPLANT
KIT DRAIN CSF ACCUDRAIN (MISCELLANEOUS) IMPLANT
KIT ROOM TURNOVER OR (KITS) ×4 IMPLANT
KNIFE ARACHNOID DISP AM-24-S (MISCELLANEOUS) ×4 IMPLANT
MARKER SPHERE PSV REFLC 13MM (MARKER) ×20 IMPLANT
NEEDLE HYPO 25X1 1.5 SAFETY (NEEDLE) ×4 IMPLANT
NEEDLE SPNL 18GX3.5 QUINCKE PK (NEEDLE) IMPLANT
NS IRRIG 1000ML POUR BTL (IV SOLUTION) ×4 IMPLANT
PACK CRANIOTOMY (CUSTOM PROCEDURE TRAY) ×4 IMPLANT
PAD EYE OVAL STERILE LF (GAUZE/BANDAGES/DRESSINGS) IMPLANT
PATTIES SURGICAL .25X.25 (GAUZE/BANDAGES/DRESSINGS) IMPLANT
PATTIES SURGICAL .5 X.5 (GAUZE/BANDAGES/DRESSINGS) ×4 IMPLANT
PATTIES SURGICAL .5 X3 (DISPOSABLE) IMPLANT
PATTIES SURGICAL 1/4 X 3 (GAUZE/BANDAGES/DRESSINGS) IMPLANT
PATTIES SURGICAL 1X1 (DISPOSABLE) IMPLANT
RUBBERBAND STERILE (MISCELLANEOUS) ×8 IMPLANT
SET TUBING W/EXT DISP (INSTRUMENTS) ×4 IMPLANT
SPECIMEN JAR SMALL (MISCELLANEOUS) IMPLANT
SPONGE NEURO XRAY DETECT 1X3 (DISPOSABLE) IMPLANT
SPONGE SURGIFOAM ABS GEL 100 (HEMOSTASIS) ×4 IMPLANT
STAPLER VISISTAT 35W (STAPLE) ×4 IMPLANT
STOCKINETTE 6  STRL (DRAPES) ×2
STOCKINETTE 6 STRL (DRAPES) ×2 IMPLANT
SUT ETHILON 3 0 FSL (SUTURE) IMPLANT
SUT ETHILON 3 0 PS 1 (SUTURE) ×4 IMPLANT
SUT NURALON 4 0 TR CR/8 (SUTURE) ×12 IMPLANT
SUT SILK 0 TIES 10X30 (SUTURE) IMPLANT
SUT VIC AB 0 CT1 18XCR BRD8 (SUTURE) ×10 IMPLANT
SUT VIC AB 0 CT1 8-18 (SUTURE) ×10
SUT VIC AB 3-0 SH 8-18 (SUTURE) ×4 IMPLANT
SUT VICRYL 3-0 RB1 18 ABS (SUTURE) ×12 IMPLANT
SYR CONTROL 10ML LL (SYRINGE) ×4 IMPLANT
TIP SONASTAR STD MISONIX 1.9 (TRAY / TRAY PROCEDURE) IMPLANT
TIP STRAIGHT 25KHZ (INSTRUMENTS) ×4 IMPLANT
TOWEL OR 17X24 6PK STRL BLUE (TOWEL DISPOSABLE) ×4 IMPLANT
TOWEL OR 17X26 10 PK STRL BLUE (TOWEL DISPOSABLE) ×4 IMPLANT
TRAY FOLEY CATH 14FRSI W/METER (CATHETERS) IMPLANT
TRAY FOLEY CATH 16FRSI W/METER (SET/KITS/TRAYS/PACK) ×4 IMPLANT
TUBE CONNECTING 12'X1/4 (SUCTIONS) ×1
TUBE CONNECTING 12X1/4 (SUCTIONS) ×3 IMPLANT
UNDERPAD 30X30 INCONTINENT (UNDERPADS AND DIAPERS) IMPLANT
WATER STERILE IRR 1000ML POUR (IV SOLUTION) ×4 IMPLANT

## 2014-06-03 NOTE — Progress Notes (Addendum)
Marland Kitchen   [facility]                                                                              Patient Demographics  Ray Garcia, is a 54 y.o. male, DOB - July 31, 1960, MEQ:[id number]  Admit date - 05/20/2014   Admitting Physician Lavina Hamman, MD  Outpatient Primary MD for the patient is [name], [name], MD  LOS - 14   No chief complaint on file.     HPI on 05/20/2014 by Dr. Shanda Howells This is a 54 y.o. year old male with significant past medical history of cirrhosis, hepatitis C, HTN, dental caries/abscess, ETOH abuse presenting with cerebellar mass. Patient presented today at [facility] with chief complaint of recurrent intermittent headaches as well as recurrent nausea and dizziness. States that symptoms have been fairly persistent for over the past 2 weeks. No reported head trauma. Reports only minimal alcohol intake. One to 2 beers per day. Previous heavy drinker. No hemiparesis or confusion. Also with noted dental caries and dental abscess. Is currently on oral penicillin for this. Presents his [facility] afebrile, hemodynamically stable. A head CT obtained that [facility] showed abnormal appearance of the left cerebellar hemisphere concerning for underlying mass lesion with associated edema. Per report, ER physician assistant discussed case with on-call neurosurgery at [facility] recommended medical admission with formal neurosurgical consultation. Labs are pending.   Interim history Neurosurgery and oncology as well as radiation oncology all consulted. Patient did have [place] on 06/02/2014 and became extremely nauseated. Patient due to have suboccipital craniotomy with tumor resection 06/03/2014. Patient will need to follow-up with oncology as an outpatient.  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 CT scan of  lungs showing a 7.7 cm posterior right upper lobe mass, making this cerebellar mass lesion suspicious for metastasis -Radiation Onc and neurosurgery consulted consulted.  -Continue IV steroids- per neurosurgery, Dr. Kathyrn Sheriff, if non-small cell CA, may require resection of the cerebellar lesion (note on 05/22/2014) -Spoke with Dr, Kathyrn Sheriff (on 05/27/2014) regarding results of biopsy (invavsive adenocarcinoma) -Patient scheduled for stereotatic radiosurgery on 06/02/2014 -Patient scheduled for suboccipital craniotomy for tumor resectoion on 06/03/2014 -Oncology, Dr. Alvy Bimler, consulted and appreciated, will follow up with patient on 5/19 as an outpatient  Headache/Dizziness -Like secondary to the above -Continue pain medication, patient feels his headache has improved slightly -Dizziness is improving.  -Continue meclizine '25mg'$  PRN  Probable metastatic lung cancer -Patient has a 30-pack-year smoking history, CT chest showed 7.7 cm posterior right upper lobe mass compatible with primary bronchogenic neoplasm. 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 -Worsened after radiation treatment -Continue antiemetics as needed.  Liver cirrhosis: Likely due to hepatitis C and chronic alcoholism -CT abdomen and pelvis showed no evidence of metastatic disease in the abdomen and pelvis.   Chronic alcoholism. -Patient reported that his last drink about a month ago, currently does not exhibit signs or  symptoms of alcohol withdrawal  Essential hypertension  -Currently stable  Constipation -Resolved, continue colace  Leukocytosis -Likely secondary to decadron -Continue to monitor  Thrombocytopenia -Platelets 97, Heparin discontinued, continue SCDs -Continue to monitor CBC -HIT 0.206 (normal)  Dental  caries/abscess -Was on PCN as an outpatient -Discontinued clindamycin (received 10 days of antibiotics)  Mild Epistaxis -resolved -Afrin is bleeding restarts  Hyperglycemia -Secondary to steroids (no history of diabetes) -Continue antus and ISS with CBG montioring  Code Status: Full  Family Communication: None at bedside. Wife via phone  Disposition Plan: Admitted,  Resection today 06/03/2014.  Addendum: Patient will be transferred to ICU after surgery.  Spoke with Dr. Kathyrn Sheriff who has graciously taken over care of the patient.  [facility] will sign off for now.  Please let us know if we can be of service after the patient is transferred out of ICU.    Time Spent in minutes   20 minutes  Procedures  CT guided biopsy- by IR Stereotatic radiation treatment  Consults   Pulmonology Interventional radiology Neurosurgery Oncology  DVT Prophylaxis  Heparin  Lab Results  Component Value Date   PLT 97* 06/03/2014    Medications  Scheduled Meds: . [MAR Hold] dexamethasone  4 mg Intravenous 3 times per day  . [MAR Hold] docusate sodium  100 mg Oral BID  . [MAR Hold] feeding supplement (ENSURE ENLIVE)  237 mL Oral BID BM  . [MAR Hold] folic acid  1 mg Oral Daily  . [MAR Hold] multivitamin with minerals  1 tablet Oral Daily  . [MAR Hold] oxymetazoline  2 spray Right Nare Once  . [MAR Hold] polyethylene glycol  17 g Oral Daily  . [MAR Hold] saccharomyces boulardii  250 mg Oral BID  . [MAR Hold] thiamine  100 mg Oral Daily   Continuous Infusions: . sodium chloride 100 mL/hr at 06/03/14 0107   PRN Meds:.[MAR Hold] hydrALAZINE, [MAR Hold] meclizine, [MAR Hold]  morphine injection, [MAR Hold] ondansetron **OR** [MAR Hold] ondansetron (ZOFRAN) IV, [MAR Hold] oxyCODONE, [MAR Hold] promethazine  Antibiotics    Anti-infectives    Start     Dose/Rate Route Frequency Ordered Stop   06/03/14 0831  vancomycin (VANCOCIN) 1 GM/200ML IVPB    Comments:  Bryna Colander   : cabinet  override      06/03/14 0831 06/03/14 0935   05/22/14 0015  clindamycin (CLEOCIN) capsule 300 mg  Status:  Discontinued     300 mg Oral 3 times per day 05/21/14 2359 05/29/14 1226   05/21/14 0645  penicillin v potassium (VEETID) tablet 500 mg  Status:  Discontinued     500 mg Oral 4 times per day 05/21/14 3500 05/21/14 2358      Subjective:   Iona Beard Scarfone seen and examined today.  Patient complains of pain and nausea.  Denies chest pain or shortness of breath.  Ready to get his surgery over with. Feels his nausea is not controlled and worse after radiation.    Objective:   Filed Vitals:   06/02/14 2137 06/02/14 2355 06/03/14 0144 06/03/14 0600  BP: 165/84 147/96 141/81 135/80  Pulse: 98 59 59 57  Temp: 99 F (37.2 C) 97.8 F (36.6 C) 97.8 F (36.6 C) 97.9 F (36.6 C)  TempSrc: Oral Axillary Axillary Axillary  Resp: '20 20 19 18  '$ Height:      Weight:      SpO2: 98% 95% 97% 95%    Wt Readings from Last 3 Encounters:  05/20/14 92.8 kg (204  lb 9.4 oz)     Intake/Output Summary (Last 24 hours) at 06/03/14 1000 Last data filed at 06/03/14 0602  Gross per 24 hour  Intake    960 ml  Output   1900 ml  Net   -940 ml    Exam  General: Well developed, well nourished, mild distress  Cardiovascular: Normal S1/S2, RRR, no murmurs  Respiratory: Clear to auscultation   Abdomen: Soft, nontender, nondistended, + bowel sounds  Extremities: warm dry without cyanosis clubbing or edema  Neuro: AAOx3, nonfocal    Data Review   Micro Results Recent Results (from the past 240 hour(s))  Surgical pcr screen     Status: None   Collection Time: 06/03/14  1:48 AM  Result Value Ref Range Status   MRSA, PCR NEGATIVE NEGATIVE Final   Staphylococcus aureus NEGATIVE NEGATIVE Final    Comment:        The Xpert SA Assay (FDA approved for NASAL specimens in patients over 10 years of age), is one component of a comprehensive surveillance program.  Test performance has been  validated by [facility] for patients greater than or equal to 36 year old. It is not intended to diagnose infection nor to guide or monitor treatment.     Radiology Reports Ct Chest W Contrast  05/21/2014   CLINICAL DATA:  Cirrhosis, hepatitis C. Cerebellar mass. Lung mass on chest radiograph. Evaluate for metastatic disease.  EXAM: CT CHEST, ABDOMEN, AND PELVIS WITH CONTRAST  TECHNIQUE: Multidetector CT imaging of the chest, abdomen and pelvis was performed following the standard protocol during bolus administration of intravenous contrast.  CONTRAST:  163m OMNIPAQUE IOHEXOL 300 MG/ML  SOLN  COMPARISON:  Chest radiograph dated 05/20/2014.  FINDINGS: CT CHEST FINDINGS  Mediastinum/Nodes: The heart is normal in size. No pericardial effusion.  Coronary atherosclerosis in the LAD.  9 mm short axis right hilar node (series 3/ image 23). Small right paratracheal nodes measuring up to 6 mm short axis (series 3/image 20). 7 mm short axis subcarinal node (series 3/ image 23).  No suspicious axillary or supraclavicular nodes.  Visualized thyroid is unremarkable.  Lungs/Pleura: Macrolobulated 6.6 x 7.7 x 6.4 cm mass in the posterior right upper lobe (series 4/image 14), which abuts the posterior chest wall/pleural surface, but does not demonstrate definite overlying osseous invasion.  No evidence of satellite nodularity or lymphangitic spread. Lungs are otherwise clear.  Mild dependent scarring/ atelectasis in the right lower lobe.  No pleural effusion or pneumothorax.  Musculoskeletal: Mild degenerative changes of the thoracic spine.  CT ABDOMEN PELVIS FINDINGS  Hepatobiliary: Cirrhosis. Mildly increased subcapsular perfusion inferiorly in thea posterior segment right hepatic lobe (series 3/image 70), which normalizes on the delayed phase, without definite underlying mass.  Cholelithiasis (series 3/ image 66). Gallbladder is underdistended. No intrahepatic or extrahepatic ductal dilatation.  Pancreas: Within  normal limits.  Spleen: Splenomegaly, measuring 15.9 cm in maximal craniocaudal dimension.  Adrenals/Urinary Tract: Adrenal glands are within normal limits.  Kidneys are within normal limits.  No hydronephrosis.  Layering excretory contrast (likely gadolinium) within the bladder.  Stomach/Bowel: Stomach is within normal limits.  No evidence of bowel obstruction.  Normal appendix.  Colonic diverticulosis, without evidence of diverticulitis.  Vascular/Lymphatic: Atherosclerotic calcifications of the abdominal aorta and branch vessels.  Small upper abdominal lymph nodes measuring up to 10 mm short axis, likely reactive.  Reproductive: Prostate is unremarkable.  Other: Moderate fat containing left inguinal/ scrotal hernia.  Musculoskeletal: Degenerative changes of the lumbar spine.  IMPRESSION: 7.7  cm posterior right upper lobe mass which abuts the posterior chest wall/pleural surface, compatible with primary bronchogenic neoplasm.  9 mm short axis right hilar node, worrisome for nodal metastasis. Additional small right paratracheal and subcarinal nodes, indeterminate.  No evidence of metastatic disease in the abdomen/pelvis.  Additional ancillary findings as above.   Electronically Signed   By: Julian Hy M.D.   On: 05/21/2014 13:21   Mr Jeri Cos LZ Contrast  05/27/2014   CLINICAL DATA:  Metastatic disease.  Lung mass.  EXAM: MRI HEAD WITHOUT AND WITH CONTRAST  TECHNIQUE: Multiplanar, multiecho pulse sequences of the brain and surrounding structures were obtained without and with intravenous contrast.  CONTRAST:  33m MULTIHANCE GADOBENATE DIMEGLUMINE 529 MG/ML IV SOLN  COMPARISON:  MRI head 05/21/2014  FINDINGS: SRS protocol at 3 Tesla performed.  Enhancing mass lesion in the left cerebellar tonsil measures 33 x 26 x 37 mm and is unchanged in size. The mass shows diffuse enhancement. There may be some minimal internal hemorrhage. The white matter edema has improved since the prior study due to steroid  treatment. There remains mass-effect on the fourth ventricle which is flattened and displaced slightly to the right. No hydrocephalus. The mass extends into the left posterior lateral medulla were there is some edema and enhancing tumor.  No other enhancing mass lesions identified.  Ventricle size is normal. Negative for acute infarct. No significant chronic ischemia.  Paranasal sinuses clear neck  The patient was not able to hold still and some of the images are degraded by motion  IMPRESSION: 33 x 26 x 27 mm left cerebellar tonsillar mass is unchanged in size. There is invasion of the posterior lateral medulla on the left. Surrounding white matter edema has improved following treatment with steroids. This appears to be a solitary metastatic deposit.   Electronically Signed   By: CFranchot GalloM.D.   On: 05/27/2014 19:28   Mr BJeri CosWJQContrast  05/21/2014   CLINICAL DATA:  Dizziness with nausea and vomiting. Known brain mass.  EXAM: MRI HEAD WITHOUT AND WITH CONTRAST  TECHNIQUE: Multiplanar, multiecho pulse sequences of the brain and surrounding structures were obtained without and with intravenous contrast.  CONTRAST:  239mMULTIHANCE GADOBENATE DIMEGLUMINE 529 MG/ML IV SOLN  COMPARISON:  [facility]  FINDINGS: Calvarium and upper cervical spine: No focal marrow signal abnormality.  Orbits: No significant findings.  Sinuses: Retention cysts in the adenoid. No paranasal sinus or mastoid obstruction.  Brain: There is diffusely enhancing mass located in the lower left cerebellum with growth across the inferior cerebellar peduncle into the dorsal lateral medulla. The mass measures 32 x 25 x 37 mm. T2 hypo intensity and partial diffusion restriction suggests dense cellularity. There could be minimal blood products internally, but no measurable hematoma. No additional mass lesion is seen. Vasogenic edema surrounds the mass, with crowding in the posterior fossa and upper  displacement of the cerebellum. The lower fourth ventricle it is partially effaced, but there is no hydrocephalus.  No infarct, major vessel occlusion, for significant white matter disease.  IMPRESSION: 1. 32 x 25 x 37 mm densely cellular left cerebellar mass which extends into the left inferior peduncle and medulla. Given patient's right upper lobe mass, this is most consistent with a solitary metastasis. 2. Vasogenic edema partially effaces the lower fourth ventricle. No hydrocephalus.   Electronically Signed   By: JoMonte Fantasia.D.   On: 05/21/2014 12:04   Ct Abdomen  Pelvis W Contrast  05/21/2014   CLINICAL DATA:  Cirrhosis, hepatitis C. Cerebellar mass. Lung mass on chest radiograph. Evaluate for metastatic disease.  EXAM: CT CHEST, ABDOMEN, AND PELVIS WITH CONTRAST  TECHNIQUE: Multidetector CT imaging of the chest, abdomen and pelvis was performed following the standard protocol during bolus administration of intravenous contrast.  CONTRAST:  183m OMNIPAQUE IOHEXOL 300 MG/ML  SOLN  COMPARISON:  Chest radiograph dated 05/20/2014.  FINDINGS: CT CHEST FINDINGS  Mediastinum/Nodes: The heart is normal in size. No pericardial effusion.  Coronary atherosclerosis in the LAD.  9 mm short axis right hilar node (series 3/ image 23). Small right paratracheal nodes measuring up to 6 mm short axis (series 3/image 20). 7 mm short axis subcarinal node (series 3/ image 23).  No suspicious axillary or supraclavicular nodes.  Visualized thyroid is unremarkable.  Lungs/Pleura: Macrolobulated 6.6 x 7.7 x 6.4 cm mass in the posterior right upper lobe (series 4/image 14), which abuts the posterior chest wall/pleural surface, but does not demonstrate definite overlying osseous invasion.  No evidence of satellite nodularity or lymphangitic spread. Lungs are otherwise clear.  Mild dependent scarring/ atelectasis in the right lower lobe.  No pleural effusion or pneumothorax.  Musculoskeletal: Mild degenerative changes of the  thoracic spine.  CT ABDOMEN PELVIS FINDINGS  Hepatobiliary: Cirrhosis. Mildly increased subcapsular perfusion inferiorly in thea posterior segment right hepatic lobe (series 3/image 70), which normalizes on the delayed phase, without definite underlying mass.  Cholelithiasis (series 3/ image 66). Gallbladder is underdistended. No intrahepatic or extrahepatic ductal dilatation.  Pancreas: Within normal limits.  Spleen: Splenomegaly, measuring 15.9 cm in maximal craniocaudal dimension.  Adrenals/Urinary Tract: Adrenal glands are within normal limits.  Kidneys are within normal limits.  No hydronephrosis.  Layering excretory contrast (likely gadolinium) within the bladder.  Stomach/Bowel: Stomach is within normal limits.  No evidence of bowel obstruction.  Normal appendix.  Colonic diverticulosis, without evidence of diverticulitis.  Vascular/Lymphatic: Atherosclerotic calcifications of the abdominal aorta and branch vessels.  Small upper abdominal lymph nodes measuring up to 10 mm short axis, likely reactive.  Reproductive: Prostate is unremarkable.  Other: Moderate fat containing left inguinal/ scrotal hernia.  Musculoskeletal: Degenerative changes of the lumbar spine.  IMPRESSION: 7.7 cm posterior right upper lobe mass which abuts the posterior chest wall/pleural surface, compatible with primary bronchogenic neoplasm.  9 mm short axis right hilar node, worrisome for nodal metastasis. Additional small right paratracheal and subcarinal nodes, indeterminate.  No evidence of metastatic disease in the abdomen/pelvis.  Additional ancillary findings as above.   Electronically Signed   By: SJulian HyM.D.   On: 05/21/2014 13:21   Ct Biopsy  05/23/2014   CLINICAL DATA:  Right upper lobe mass.  Intracranial lesion.  EXAM: CT-GUIDED BIOPSY OF A RIGHT UPPER LOBE MASS.  CORE.  MEDICATIONS AND MEDICAL HISTORY: Versed 2 mg, Fentanyl 100 mcg.  Additional Medications: None.  ANESTHESIA/SEDATION: Moderate sedation time: 10  minutes  PROCEDURE: The procedure, risks, benefits, and alternatives were explained to the patient. Questions regarding the procedure were encouraged and answered. The patient understands and consents to the procedure.  The right upper posterior thorax was prepped with Betadine in a sterile fashion, and a sterile drape was applied covering the operative field. A sterile gown and sterile gloves were used for the procedure.  Under CT guidance, a(n) 17 gauge guide needle was advanced into the right upper lobe lung mass. Subsequently 3 18 gauge core biopsies were obtained. The guide needle was removed. Final imaging was  performed.  Patient tolerated the procedure well without complication. Vital sign monitoring by nursing staff during the procedure will continue as patient is in the special procedures unit for post procedure observation.  FINDINGS: The images document guide needle placement within the right upper lobe lung mass. Post biopsy images demonstrate no pneumothorax.  COMPLICATIONS: None  IMPRESSION: Successful CT-guided right upper lobe lung mass core biopsy.   Electronically Signed   By: Marybelle Killings M.D.   On: 05/23/2014 14:13   Dg Chest Port 1 View  05/23/2014   CLINICAL DATA:  Evaluate for pneumothorax after lung biopsy  EXAM: PORTABLE CHEST - 1 VIEW  COMPARISON:  None.  FINDINGS: The heart size and mediastinal contours are within normal limits. Right upper lobe lung mass is again noted. No pneumothorax identified. The visualized skeletal structures are unremarkable.  IMPRESSION: 1. No pneumothorax noted following lung biopsy.   Electronically Signed   By: Kerby Moors M.D.   On: 05/23/2014 14:47    CBC  Recent Labs Lab 05/30/14 0543 05/31/14 0550 06/01/14 0543 06/02/14 0630 06/03/14 0525  WBC 9.5 8.9 10.4 11.7* 12.7*  HGB 15.6 15.2 15.5 16.0 16.4  HCT 44.8 43.6 44.2 45.6 46.7  PLT 103* 87* 84* 88* 97*  MCV 90.7 92.0 91.3 92.1 91.7  MCH 31.6 32.1 32.0 32.3 32.2  MCHC 34.8 34.9 35.1  35.1 35.1  RDW 13.4 13.4 13.5 13.5 13.5  LYMPHSABS 1.3 0.9 1.2 1.0 1.9  MONOABS 0.8 0.8 0.9 0.7 1.0  EOSABS 0.0 0.0 0.0 0.0 0.0  BASOSABS 0.0 0.0 0.0 0.0 0.0    Chemistries   Recent Labs Lab 06/02/14 0630  NA 131*  K 4.4  CL 96*  CO2 25  GLUCOSE 450*  BUN 27*  CREATININE 0.70  CALCIUM 9.3   ------------------------------------------------------------------------------------------------------------------ estimated creatinine clearance is 118.6 mL/min (by C-G formula based on Cr of 0.7). ------------------------------------------------------------------------------------------------------------------ No results for input(s): HGBA1C in the last 72 hours. ------------------------------------------------------------------------------------------------------------------ No results for input(s): CHOL, HDL, LDLCALC, TRIG, CHOLHDL, LDLDIRECT in the last 72 hours. ------------------------------------------------------------------------------------------------------------------ No results for input(s): TSH, T4TOTAL, T3FREE, THYROIDAB in the last 72 hours.  Invalid input(s): FREET3 ------------------------------------------------------------------------------------------------------------------ No results for input(s): VITAMINB12, FOLATE, FERRITIN, TIBC, IRON, RETICCTPCT in the last 72 hours.  Coagulation profile No results for input(s): INR, PROTIME in the last 168 hours.  No results for input(s): DDIMER in the last 72 hours.  Cardiac Enzymes No results for input(s): CKMB, TROPONINI, MYOGLOBIN in the last 168 hours.  Invalid input(s): CK ------------------------------------------------------------------------------------------------------------------ Invalid input(s): POCBNP    Thaniel Coluccio D.O. on 06/03/2014 at 10:00 AM  Between 7am to 7pm - Pager - [phone]  After 7pm go to [email or web address] - password [email or web address]  And look for the night coverage person covering for me after  hours  [facility] Office  [phone]

## 2014-06-03 NOTE — Progress Notes (Signed)
ANTIBIOTIC CONSULT NOTE - INITIAL  Pharmacy Consult for Vancomycin x 24 hrs post-op Indication: surgical prophylaxis  Allergies  Allergen Reactions  . Amoxicillin Rash  . Ibuprofen Other (See Comments)    Pt stated ibuprofen made him pass out  . Penicillins Nausea And Vomiting    And a rash    Patient Measurements: Height: 5' 9.6" (176.8 cm) Weight: 204 lb 9.4 oz (92.8 kg) IBW/kg (Calculated) : 72.08  Vital Signs: Temp: 98.3 F (36.8 C) (05/10 1430) Temp Source: Axillary (05/10 0600) BP: 157/93 mmHg (05/10 1530) Pulse Rate: 72 (05/10 1525) Intake/Output from previous day: 05/09 0701 - 05/10 0700 In: 1440 [P.O.:1440] Out: 2650 [Urine:2650] Intake/Output from this shift: Total I/O In: 2201.3 [I.V.:2201.3] Out: 1525 [Urine:1025; Blood:500]  Labs:  Recent Labs  06/01/14 0543 06/02/14 0630 06/03/14 0525  WBC 10.4 11.7* 12.7*  HGB 15.5 16.0 16.4  PLT 84* 88* 97*  CREATININE  --  0.70  --    Estimated Creatinine Clearance: 118.6 mL/min (by C-G formula based on Cr of 0.7).  Microbiology: Recent Results (from the past 720 hour(s))  Surgical pcr screen     Status: None   Collection Time: 06/03/14  1:48 AM  Result Value Ref Range Status   MRSA, PCR NEGATIVE NEGATIVE Final   Staphylococcus aureus NEGATIVE NEGATIVE Final    Comment:        The Xpert SA Assay (FDA approved for NASAL specimens in patients over 56 years of age), is one component of a comprehensive surveillance program.  Test performance has been validated by [facility] for patients greater than or equal to 86 year old. It is not intended to diagnose infection nor to guide or monitor treatment.     Medical History: Past Medical History  Diagnosis Date  . ETOH abuse   . Dental caries   . Cirrhosis of liver   . Hepatitis     c  . Arthritis    Assessment:   54 yr old male s/p craniectomy for resection of tumor.   Penicillin allergy, Vancomycin 1 gram IV given pre-op at 8:35 am.  For  24hrs post-op prophylaxis.  Goal of Therapy:  Vancomycin trough level 10-15 mcg/ml  Plan:   Vancomycin 1 gram IV q8hrs x 3 doses.  Last dose will be ~8am on 5/11.  No Rx follow-up needed. Will sign off.  Arty Baumgartner, [place] Pager: [phone] 06/03/2014,3:36 PM

## 2014-06-03 NOTE — Progress Notes (Signed)
Pt observed to be very weak and lethargic with c/o of nausea and vomiting, said to have refused his night medication, convinced pt to take iv zofran '4mg'$  which was given at 2337, but still having dry hives, Dr Annette Stable (on call) paged and notified,ordered to give NS 500cc bolus and thereafter continue with NS at 148m, iv phernegan 12.mg given at 0050 alogside the saline bolus at 0022, pt reassured, will however continue to monitor. Obasogie-Asidi, Bettyjane Shenoy Efe

## 2014-06-03 NOTE — Op Note (Signed)
PREOP DIAGNOSIS:  1. Metastatic lung cancer   POSTOP DIAGNOSIS: Same  PROCEDURE: 1. Stereotactic suboccipital craniectomy for resection of tumor 2. Use of intraoperative microscope for microdissection  SURGEON: Dr. Consuella Lose, MD  ASSISTANT: Dr. Kristeen Miss, MD  ANESTHESIA: General Endotracheal  EBL: 500cc  SPECIMENS: Cerebellar tumor for permanent pathology  DRAINS: None  COMPLICATIONS: None immediate3  CONDITION: Hemodynamically stable to PACU  HISTORY: Ray Garcia is a 54 y.o. male who presented to the hospital with severe nausea and dizziness. Workup included CT scan which demonstrated a cerebellar lesion. Further workup found a right lung mass, and biopsy confirmed non-small cell lung cancer. Given the size of the cerebellar lesion, its location, and surrounding edema, surgical resection was indicated. The patient underwent preoperative stereotactic radiosurgery, and now presents for surgical resection. The risks and benefits of the surgery were reviewed with the patient in detail. After all questions were answered, informed consent was obtained.  PROCEDURE IN DETAIL: The patient was brought to the operating room via stretcher. After induction of general anesthesia, the patient was positioned on the operative table in the prone position after the [place] head holder was applied. All pressure points were meticulously padded. Utilizing the preoperative stereotactic MRI scan, surface markers were co-registered until a satisfactory accuracy was achieved. Skin incision was then marked out and prepped and draped in the usual sterile fashion.  After timeout was conducted, midline skin incision was marked out, infiltrated with local anesthetic with epinephrine, and skin incision was made sharply. Bovie electrocautery was used to dissect the subcutaneous taste tissue until the nuchal fascia was identified. This was then incised in a Y-shaped fashion based along the superior  nuchal line. Suboccipital musculature was then divided in the avascular midline plane. Subperiosteal dissection along the occipital bone as well as the lamina of C1 was then carried out. Self-retaining retractors were then placed.  Utilizing the stereotactic system, the torcular, and bilateral transverse sinuses were identified. A suboccipital craniectomy was then completed using a combination of high-speed drill and Kerrison Rogers. The foramen magnum was opened from lateral to lateral edge. C1 laminectomy was then completed using the high-speed drill and Kerrison Rogers. The atlantooccipital membrane was then cauterized and elevated from the dura. Hemostasis on the dural surface was then secured using bipolar electrocautery and morcellized Gelfoam with thrombin.  At this point, the microscope was draped sterilely and brought into the field, and the remainder of the case was done under the microscope using microdissection. Y shaped dural incision was made, and 4-0 Nurolon was used for dural tack ups. Hemostasis on the dural edges was achieved with Wecht clips.   The tumor was easily identifiable in the left cerebellar tonsil, which appeared more white, and had a somewhat cobblestone appearance. The cerebellar tonsils were dissected from each other, and the obex was identified. The tonsillar loop of the PICA was also identified on the left side. At this point, utilizing a combination of bipolar electrocautery and suction, cerebellar corticectomy was made, and the dorsal, superior, and lateral borders of the tumor were dissected away from the surrounding cerebellar white matter. As this was done, the tumor and left cerebellar tonsil were elevated from the medulla and a caudal to cranial fashion. Attention was then turned to the midline, where the tumor was truncated dorsal to the medulla leaving a rim of tumor along the medulla in order to prevent entry into the brain stem. Tumor was then removed and sent for  permanent pathology.  The margins  of the resection cavity were then inspected, and further tumor was identified and removed in the superolateral portion of the resection bed. Once this was done, hemostasis was achieved using a combination of bipolar electrocautery and morcellized Gelfoam with thrombin.  The wound was then irrigated with copious amounts of normal saline irrigation. A dural inlay graft was then placed, the dural edges were reapproximated using interrupted 4 Nurolon stitches, and a dural onlay graft was placed.  The suboccipital musculature was then reapproximated using 0 Vicryl stitches, the nuchal fascia was reapproximated in a watertight fashion using interrupted 0 Vicryl stitches, and the skin was closed using interrupted subcuticular 30 stitches with Dermabond. Sterile dressing was then applied.  At the end of the case all sponge, needle, cottonoid, and instrument counts were correct. The patient was then transferred to the stretcher, the Mayfield head holder was removed, he was asked abated, and taken to the postanesthesia care unit in stable hemodynamic condition.

## 2014-06-03 NOTE — Transfer of Care (Signed)
Immediate Anesthesia Transfer of Care Note  Patient: Ray Garcia  Procedure(s) Performed: Procedure(s): APPLICATION OF CRANIAL NAVIGATION (N/A) CRANIECTOMY POSTERIOR FOSSA DECOMPRESSION FOR TUMOR  Patient Location: Nursing Unit  Anesthesia Type:General  Level of Consciousness: lethargic and responds to stimulation  Airway & Oxygen Therapy: Patient Spontanous Breathing and Patient connected to face mask oxygen  Post-op Assessment: Report given to RN  Post vital signs: Reviewed and stable  Last Vitals:  Filed Vitals:   06/03/14 0600  BP: 135/80  Pulse: 57  Temp: 36.6 C  Resp: 18    Complications: No apparent anesthesia complications

## 2014-06-03 NOTE — Anesthesia Preprocedure Evaluation (Addendum)
Anesthesia Evaluation  Patient identified by MRN, date of birth, ID band Patient awake    Reviewed: Allergy & Precautions, NPO status , Patient's Chart, lab work & pertinent test results  Airway Mallampati: II   Neck ROM: full    Dental  (+) Poor Dentition, Chipped, Loose, Missing, Dental Advisory Given   Pulmonary former smoker,  breath sounds clear to auscultation        Cardiovascular negative cardio ROS  Rhythm:regular Rate:Normal     Neuro/Psych    GI/Hepatic (+) Cirrhosis -    substance abuse  alcohol use, Hepatitis -, C  Endo/Other    Renal/GU      Musculoskeletal   Abdominal   Peds  Hematology   Anesthesia Other Findings   Reproductive/Obstetrics                            Anesthesia Physical Anesthesia Plan  ASA: II  Anesthesia Plan: General   Post-op Pain Management:    Induction: Intravenous  Airway Management Planned: Oral ETT  Additional Equipment: Arterial line and CVP  Intra-op Plan:   Post-operative Plan: Extubation in OR  Informed Consent: I have reviewed the patients History and Physical, chart, labs and discussed the procedure including the risks, benefits and alternatives for the proposed anesthesia with the patient or authorized representative who has indicated his/her understanding and acceptance.     Plan Discussed with: CRNA, Anesthesiologist and Surgeon  Anesthesia Plan Comments:         Anesthesia Quick Evaluation

## 2014-06-03 NOTE — Progress Notes (Signed)
Report to [facility] as primary caregiver

## 2014-06-03 NOTE — Progress Notes (Signed)
UR complete.  Marquita Lias RN, MSN 

## 2014-06-03 NOTE — Anesthesia Procedure Notes (Signed)
Procedure Name: Intubation Date/Time: 06/03/2014 8:46 AM Performed by: Barrington Ellison Pre-anesthesia Checklist: Patient identified, Emergency Drugs available, Suction available, Patient being monitored and Timeout performed Patient Re-evaluated:Patient Re-evaluated prior to inductionOxygen Delivery Method: Circle system utilized Preoxygenation: Pre-oxygenation with 100% oxygen Intubation Type: IV induction, Rapid sequence and Cricoid Pressure applied Laryngoscope Size: Mac and 3 Grade View: Grade II Tube type: Oral Tube size: 7.5 mm Number of attempts: 1 Airway Equipment and Method: Stylet Placement Confirmation: ETT inserted through vocal cords under direct vision,  positive ETCO2 and breath sounds checked- equal and bilateral Secured at: 22 cm Tube secured with: Tape Dental Injury: Teeth and Oropharynx as per pre-operative assessment

## 2014-06-03 NOTE — Plan of Care (Signed)
Problem: Consults Goal: Diagnosis - Craniotomy Outcome: Completed/Met Date Met:  06/03/14 Left cerebellar mass

## 2014-06-04 ENCOUNTER — Encounter (HOSPITAL_COMMUNITY): Payer: Self-pay | Admitting: Neurosurgery

## 2014-06-04 LAB — BASIC METABOLIC PANEL
Anion gap: 9 (ref 5–15)
BUN: 24 mg/dL — AB (ref 6–20)
CO2: 27 mmol/L (ref 22–32)
CREATININE: 0.72 mg/dL (ref 0.61–1.24)
Calcium: 8.7 mg/dL — ABNORMAL LOW (ref 8.9–10.3)
Chloride: 102 mmol/L (ref 101–111)
GFR calc Af Amer: >60 mL/min (ref >60)
Glucose, Bld: 166 mg/dL — ABNORMAL HIGH (ref 70–99)
Potassium: 4.1 mmol/L (ref 3.5–5.1)
Sodium: 138 mmol/L (ref 135–145)

## 2014-06-04 LAB — CBC WITH DIFFERENTIAL/PLATELET
BASOS PCT: 0 % (ref 0–1)
Basophils Absolute: 0 10*3/uL (ref 0.0–0.1)
Eosinophils Absolute: 0 10*3/uL (ref 0.0–0.7)
Eosinophils Relative: 0 % (ref 0–5)
HCT: 45.1 % (ref 39.0–52.0)
HEMOGLOBIN: 15.4 g/dL (ref 13.0–17.0)
Lymphocytes Relative: 12 % (ref 12–46)
Lymphs Abs: 2 10*3/uL (ref 0.7–4.0)
MCH: 32 pg (ref 26.0–34.0)
MCHC: 34.1 g/dL (ref 30.0–36.0)
MCV: 93.6 fL (ref 78.0–100.0)
MONO ABS: 1.7 10*3/uL — AB (ref 0.1–1.0)
MONOS PCT: 10 % (ref 3–12)
NEUTROS PCT: 78 % — AB (ref 43–77)
Neutro Abs: 13.1 10*3/uL — ABNORMAL HIGH (ref 1.7–7.7)
Platelets: 98 10*3/uL — ABNORMAL LOW (ref 150–400)
RBC: 4.82 MIL/uL (ref 4.22–5.81)
RDW: 13.9 % (ref 11.5–15.5)
WBC: 16.9 10*3/uL — ABNORMAL HIGH (ref 4.0–10.5)

## 2014-06-04 LAB — GLUCOSE, CAPILLARY
GLUCOSE-CAPILLARY: 187 mg/dL — AB (ref 70–99)
Glucose-Capillary: 165 mg/dL — ABNORMAL HIGH (ref 70–99)
Glucose-Capillary: 164 mg/dL — ABNORMAL HIGH (ref 70–99)
Glucose-Capillary: 234 mg/dL — ABNORMAL HIGH (ref 70–99)
Glucose-Capillary: 138 mg/dL — ABNORMAL HIGH (ref 70–99)
Glucose-Capillary: 182 mg/dL — ABNORMAL HIGH (ref 70–99)

## 2014-06-04 MED ORDER — LORAZEPAM 2 MG/ML IJ SOLN
1.0000 mg | INTRAMUSCULAR | Status: DC | PRN
  Administered 2014-06-05: 1 mg via INTRAVENOUS
  Filled 2014-06-04: qty 1

## 2014-06-04 MED ORDER — CETYLPYRIDINIUM CHLORIDE 0.05 % MT LIQD
7.0000 mL | Freq: Two times a day (BID) | OROMUCOSAL | Status: DC
  Administered 2014-06-04 – 2014-06-18 (×27): 7 mL via OROMUCOSAL

## 2014-06-04 NOTE — Progress Notes (Signed)
Nutrition Follow-up  DOCUMENTATION CODES:  Non-severe (moderate) malnutrition in context of acute illness/injury  INTERVENTION:  Ensure Enlive (each supplement provides 350kcal and 20 grams of protein)  NUTRITION DIAGNOSIS:  Increased nutrient needs related to catabolic illness, cancer and cancer related treatments as evidenced by estimated needs.  Ongoing.   GOAL:  Patient will meet greater than or equal to 90% of their needs  Not met.   MONITOR:  Diet advancement, PO intake, Supplement acceptance, I & O's  REASON FOR ASSESSMENT:  Malnutrition Screening Tool    ASSESSMENT: 54 y.o. year old male with significant past medical history of cirrhosis, hepatitis C, HTN, dental caries/abscess, ETOH abuse presenting with cerebellar mass.  Pt s/p stereotactic radiosurgery 5/9 and stereotactic suboccipital craniectomy for resection of tumor 5/10. Since pt has been nauseated and is currently NPO.  Per RN he just received zofran and plan is to try some ensure to see if he tolerates.  Pt discussed during ICU rounds and with RN.  Spoke briefly with pt who only gave one word answers.  Medications reviewed and include: decadron, colace, folic acid, lantus, MVI, miralax, florastor   Height:  Ht Readings from Last 1 Encounters:  05/20/14 5' 9.6" (1.768 m)    Weight:  Wt Readings from Last 1 Encounters:  05/20/14 204 lb 9.4 oz (92.8 kg)    Ideal Body Weight:  75.5 kg  Wt Readings from Last 10 Encounters:  05/20/14 204 lb 9.4 oz (92.8 kg)    BMI:  Body mass index is 29.69 kg/(m^2).  Estimated Nutritional Needs:  Kcal:  9311-2162  Protein:  100-115 grams  Fluid:  2.3-2.6 L/day  Skin:  Reviewed, no issues  Diet Order:  Diet NPO time specified  EDUCATION NEEDS:  No education needs identified at this time   Intake/Output Summary (Last 24 hours) at 06/04/14 1028 Last data filed at 06/04/14 0809  Gross per 24 hour  Intake 3538.75 ml  Output   4495 ml  Net  -956.25 ml    Last BM:  5/7  [place], [place], [place] Pager [phone] After Hours Pager

## 2014-06-04 NOTE — Progress Notes (Signed)
Patient ID: Ray Garcia, male   DOB: [birth year], 54 y.o.   MRN: [id number] Vital signs are stable Patient complains of some headache and nausea Motor function appears to be intact MRI performed early this morning demonstrates significant residual with brainstem component of metastatic disease still present. Nonetheless there is a good posterior fossa decompression I am in favor of simply observing the situation. Continue supportive care

## 2014-06-04 NOTE — Progress Notes (Signed)
PT Cancellation Note  Patient Details Name: Ray Garcia MRN: [id number] DOB: 04-07-1960   Cancelled Treatment:    Reason Eval/Treat Not Completed: Medical issues which prohibited therapy.  Pt having increased pain and clearly panicky.  Will see as able 5/12/ 06/04/2014  Donnella Sham, [place] [phone]  (pager)   Adrina Armijo, Tessie Fass 06/04/2014, 4:03 PM

## 2014-06-04 NOTE — Progress Notes (Signed)
Ray Garcia   DOB:24-Apr-1960   OE#:[id number]   RXV#:[id number]  Patient Care Team: Braxton Feathers, MD as PCP - General (Internal Medicine) I have seen the patient, examined him and edited the notes as follows  Subjective: Patient seen and examined. Denies fevers, chills, night sweats, vision changes, or mucositis. Denies any respiratory complaints. Denies any chest pain or palpitations. Denies lower extremity swelling. Reports nausea and headaches. Denies any dysuria. Denies abnormal skin rashes, or neuropathy. Denies any bleeding issues such as epistaxis, hematemesis, hematuria or hematochezia.  S/p gamma knife and surgical resection  Scheduled Meds: . antiseptic oral rinse  7 mL Mouth Rinse BID  . dexamethasone  4 mg Intravenous 3 times per day  . dexamethasone  4 mg Intravenous TID  . docusate sodium  100 mg Oral BID  . feeding supplement (ENSURE ENLIVE)  237 mL Oral BID BM  . folic acid  1 mg Oral Daily  . insulin aspart  0-15 Units Subcutaneous 6 times per day  . insulin glargine  10 Units Subcutaneous Daily  . multivitamin with minerals  1 tablet Oral Daily  . oxymetazoline  2 spray Right Nare Once  . polyethylene glycol  17 g Oral Daily  . saccharomyces boulardii  250 mg Oral BID  . thiamine  100 mg Oral Daily   Continuous Infusions: . sodium chloride Stopped (06/04/14 0700)  . sodium chloride 75 mL/hr (06/04/14 0810)   PRN Meds:hydrALAZINE, HYDROmorphone (DILAUDID) injection, labetalol, LORazepam, meclizine, morphine injection, ondansetron **OR** ondansetron (ZOFRAN) IV, oxyCODONE, promethazine, promethazine   Objective:  Filed Vitals:   06/04/14 1116  BP:   Pulse:   Temp: 98.1 F (36.7 C)  Resp:       Intake/Output Summary (Last 24 hours) at 06/04/14 1200 Last data filed at 06/04/14 1000  Gross per 24 hour  Intake 2338.75 ml  Output   4495 ml  Net -2156.25 ml     GENERAL:alert, no distress and anxious, ill appearing SKIN: skin color, texture are normal, no  rashes or significant lesions. Multiple tattoos EYES: normal, conjunctiva are pink and non-injected, sclera clear OROPHARYNX:no exudate, no erythema and lips, buccal mucosa, and tongue normal. Poor dentition noted NECK: supple, thyroid normal size, non-tender, without nodularity LYMPH: no palpable lymphadenopathy in the cervical, axillary or inguinal LUNGS: Essentially clear to auscultation and percussion with normal breathing effort HEART: regular rate & rhythm and no murmurs and no lower extremity edema ABDOMEN:abdomen soft, non-tender and normal bowel sounds. Mild liver enlargement Musculoskeletal:no cyanosis of digits and mild clubbing  PSYCH: alert & oriented x 3 with fluent speech. Anxious. NEURO:He has mild inner deviation of the left eye. He also has gait instability, not tested. Rest of the exam with no other motor or sensory deficits    CBG (last 3)   Recent Labs  06/04/14 0007 06/04/14 0431 06/04/14 0820  GLUCAP 187* 165* 164*     Labs:   Recent Labs Lab 05/31/14 0550 06/01/14 0543 06/02/14 0630 06/03/14 0525 06/03/14 0951 06/03/14 1225 06/04/14 0630  WBC 8.9 10.4 11.7* 12.7*  --   --  16.9*  HGB 15.2 15.5 16.0 16.4 15.6 15.3 15.4  HCT 43.6 44.2 45.6 46.7 46.0 45.0 45.1  PLT 87* 84* 88* 97*  --   --  98*  MCV 92.0 91.3 92.1 91.7  --   --  93.6  MCH 32.1 32.0 32.3 32.2  --   --  32.0  MCHC 34.9 35.1 35.1 35.1  --   --  34.1  RDW 13.4 13.5 13.5 13.5  --   --  13.9  LYMPHSABS 0.9 1.2 1.0 1.9  --   --  2.0  MONOABS 0.8 0.9 0.7 1.0  --   --  1.7*  EOSABS 0.0 0.0 0.0 0.0  --   --  0.0  BASOSABS 0.0 0.0 0.0 0.0  --   --  0.0     Chemistries:    Recent Labs Lab 06/02/14 0630 06/03/14 0951 06/03/14 1225 06/04/14 0630  NA 131* 133* 133* 138  K 4.4 4.5 4.8 4.1  CL 96*  --   --  102  CO2 25  --   --  27  GLUCOSE 450* 279* 246* 166*  BUN 27*  --   --  24*  CREATININE 0.70  --   --  0.72  CALCIUM 9.3  --   --  8.7*     CBG:  Recent Labs Lab  06/03/14 1605 06/03/14 1955 06/04/14 0007 06/04/14 0431 06/04/14 0820  GLUCAP 180* 212* 187* 165* 164*     Imaging Studies:  Mr Kizzie Fantasia Contrast  06/03/2014   CLINICAL DATA:  Metastatic lung cancer. Postop cerebellar tumor resection 06/03/2014  EXAM: MRI HEAD WITHOUT AND WITH CONTRAST  TECHNIQUE: Multiplanar, multiecho pulse sequences of the brain and surrounding structures were obtained without and with intravenous contrast.  CONTRAST:  44m MULTIHANCE GADOBENATE DIMEGLUMINE 529 MG/ML IV SOLN  COMPARISON:  MRI 05/27/2014  FINDINGS: Postop resection of left cerebellar tonsil tumor. Blood and fluid are present in the surgical bed and extending into the left cerebellar tonsil. There has been partial resection of the tumor along the inferior margin. The superior margin of tumor remains. Greater than 50% of the tumor has been resected. Enhancing tumor extends into the posterior lateral medulla on the left unchanged from the prior preoperative study. Cerebellar edema surrounding the tumor is present, somewhat improved from the prior study. Mass-effect on the medulla again noted due to tumor and edema.  Negative for acute infarct.  Negative for hydrocephalus. Pneumocephalus is noted with air in the subarachnoid space in the frontal and temporal lobes bilaterally.  No other enhancing lesions are seen postcontrast administration  IMPRESSION: Partial resection of metastatic disease in the left cerebellar tonsil. The superior portion of the tumor remains. Tumor extends into the left posterior lateral medulla also unchanged.  No other tumor deposits identified  Pneumocephalus   Electronically Signed   By: CFranchot GalloM.D.   On: 06/03/2014 21:34   Dg Chest Port 1 View  06/03/2014   CLINICAL DATA:  Right IJ central line placement  EXAM: PORTABLE CHEST - 1 VIEW  COMPARISON:  05/23/2014  FINDINGS: Right IJ central line has been newly placed. Tip is at the SVC. No pneumothorax.  Right upper lobe mass  (bronchogenic carcinoma) is stable allowing for lower lung volumes. No cardiomegaly for technique. Stable aortic contours. No pneumonia or edema.  IMPRESSION: The new right IJ catheter is in good position.  No pneumothorax.   Electronically Signed   By: JMonte FantasiaM.D.   On: 06/03/2014 15:05    Assessment/Plan: 54y.o.   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 CT of the chest, abdomen and pelvis On 05/21/2014 was remarkable for a 6.4 x  6.6 x 7.7 cm posterior right upper lobe mass which abuts the posterior chest wall/pleural surface, compatible with primary bronchogenic neoplasm. 9 mm short axis right hilar node, worrisome for nodal metastasis and small right paratracheal and subcarinal nodes, indeterminate were seen.  No evidence of metastatic disease in the abdomen/pelvis He was placed on IV Decadron, with some improvement of his symptoms. A CT-guided biopsy of the right upper lobe mass on 05/14/1914, was remarkable for invasive adenocarcinoma, Lung primary. He underwent stereotatic radiosurgery on 06/02/2014 Left cerebellar 77m target was treated using 5 Rapid Arc VMAT Beams to a prescription dose of 16 Gy.(Dr. MTammi Klippel On  5/10 he had a suboccipital craniotomy for tumor resection, results pending, will follow as out-patient New MRI on 5/11 demonstrates significant residual with brainstem component of metastatic disease still present but with good fossa decompression.  PET/CT scan to be arranged as outpatient, tentatively on 5/18 with blood work and blood test for Guardant 360 for further delineation of mutation studies on the subtype of adenocarcinoma. [facility] If patient remains hospitalized,  will provide a new schedule  Anemia in neoplastic disease No transfusion is indicated at this time Monitor counts closely Transfuse blood to maintain a Hb of 8 g or if the patient is acutely bleeding  Thrombocytopenia This is due to malignancy, Hepatitis C and cirrhosis Platelets are stable at 98,000 Monitor counts closely No bleeding issues are noted. No transfusion is indicated at this time Heparin was discontinued, but HIT test was negative;  he is on mechanical devices Transfuse 1 unit of platelets if count is less or equal than 10,000 or 20,000 if the patient is acutely bleeding  Leukocytosis This is due to Decadron No intervention is indicated at this time Will continue to monitor  Nausea and Vomiting Gait instability Due to Cerebellar involvement This is managed with IV steroids and meclizine  Headaches  Due to tumor involvement This is managed with Decadron and analgesics  DVT prophylaxis On mechanical Devices  Full Code  Other medical issues as per admitting team Will sign off. Call if questions arise  WRondel Jumbo PA-C 06/04/2014  12:00 PM [place] Stevens, MD 06/04/2014

## 2014-06-04 NOTE — Progress Notes (Signed)
  Radiation Oncology         [phone] ________________________________  Name: Ray Garcia MRN: [id number]  Date: 06/02/2014  DOB: 1960-03-12  End of Treatment Note   ICD-9-CM ICD-10-CM    1. 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  ExacTrac registration was performed for each couch angle.  The 100% isodose line was prescribed.  6 MV X-rays were delivered in the flattening filter free beam mode.   Narrative: The patient tolerated radiation treatment relatively well.     Plan: The patient has completed radiation treatment. He underwent resection the following day.  The patient will return to radiation oncology clinic for routine followup in one month. I advised him to call or return sooner if he has any questions or concerns related to his recovery or treatment. ________________________________  MSheral Apley MTammi Klippel M.D.

## 2014-06-05 DIAGNOSIS — R471 Dysarthria and anarthria: Secondary | ICD-10-CM

## 2014-06-05 DIAGNOSIS — R270 Ataxia, unspecified: Secondary | ICD-10-CM

## 2014-06-05 LAB — CBC WITH DIFFERENTIAL/PLATELET
Basophils Absolute: 0 10*3/uL (ref 0.0–0.1)
Basophils Relative: 0 % (ref 0–1)
Eosinophils Absolute: 0 10*3/uL (ref 0.0–0.7)
Eosinophils Relative: 0 % (ref 0–5)
HEMATOCRIT: 41.6 % (ref 39.0–52.0)
Hemoglobin: 14.1 g/dL (ref 13.0–17.0)
LYMPHS PCT: 11 % — AB (ref 12–46)
Lymphs Abs: 1.8 10*3/uL (ref 0.7–4.0)
MCH: 31.8 pg (ref 26.0–34.0)
MCHC: 33.9 g/dL (ref 30.0–36.0)
MCV: 93.7 fL (ref 78.0–100.0)
MONO ABS: 1.4 10*3/uL — AB (ref 0.1–1.0)
Monocytes Relative: 9 % (ref 3–12)
NEUTROS ABS: 13.2 10*3/uL — AB (ref 1.7–7.7)
Neutrophils Relative %: 80 % — ABNORMAL HIGH (ref 43–77)
Platelets: 89 10*3/uL — ABNORMAL LOW (ref 150–400)
RBC: 4.44 MIL/uL (ref 4.22–5.81)
RDW: 13.6 % (ref 11.5–15.5)
WBC: 16.5 10*3/uL — AB (ref 4.0–10.5)

## 2014-06-05 LAB — GLUCOSE, CAPILLARY
GLUCOSE-CAPILLARY: 171 mg/dL — AB (ref 65–99)
Glucose-Capillary: 119 mg/dL — ABNORMAL HIGH (ref 65–99)
Glucose-Capillary: 163 mg/dL — ABNORMAL HIGH (ref 65–99)
Glucose-Capillary: 165 mg/dL — ABNORMAL HIGH (ref 65–99)
Glucose-Capillary: 195 mg/dL — ABNORMAL HIGH (ref 65–99)

## 2014-06-05 NOTE — Consult Note (Signed)
Physical Medicine and Rehabilitation Consult Reason for Consult: Metastatic lung cancer to the brain with suboccipital craniectomy resection of brain tumor Referring Physician: Dr. Kathyrn Sheriff   HPI: Ray Garcia is a 54 y.o. right handed male with history of cirrhosis, hepatitis C hypertension alcohol abuse. Patient lives with spouse independent with single-point cane prior to admission. Presented 05/20/2014 from outside hospital with recurrent intermittent headaches as well as nausea and dizziness 2 weeks. Cranial CT scan at outside hospital showed abnormal appearance of left cerebellar hemisphere concerning for underlying mass. Discharged to [facility] for further evaluation. Underwent CT chest abdomen pelvis showing a 7.7 cm mass located in the right upper lobe. Underwent CT-guided right upper lobe mass biopsy. MRI of the brain showed a 32 x 25 x 37 mm densely cellular left cerebellar mass extending into the left inferior peduncle and medulla. Given patient's right upper lobe mass consistent with solitary metastasis. Underwent stereotactic radiosurgery 06/02/2014 with suboccipital craniectomy resection of tumor per Dr. Kathyrn Sheriff. Maintained on Decadron protocol. Oncology services [place] consulted. Plan PET scan as outpatient. Noted thrombocytopenia likely related to malignancy. Maintained on a regular diet. Physical therapy evaluations completed and ongoing with recommendations of physical medicine rehabilitation consult  Pt with severe dyarthria, and lethargy Review of Systems  Unable to perform ROS: mental acuity   Past Medical History  Diagnosis Date  . ETOH abuse   . Dental caries   . Cirrhosis of liver   . Hepatitis     c  . Arthritis    Past Surgical History  Procedure Laterality Date  . Application of cranial navigation N/A 06/03/2014    Procedure: APPLICATION OF CRANIAL NAVIGATION;  Surgeon: Consuella Lose, MD;  Location: [place] [facility]UR[facility] ORS;  Service: Neurosurgery;   Laterality: N/A;  . Craniectomy  06/03/2014    Procedure: CRANIECTOMY POSTERIOR FOSSA DECOMPRESSION FOR TUMOR;  Surgeon: Consuella Lose, MD;  Location: [facility] NEURO ORS;  Service: Neurosurgery;;   History reviewed. No pertinent family history. Social History:  reports that he quit smoking about 6 weeks ago. His smoking use included Cigarettes. He has quit using smokeless tobacco. He reports that he does not drink alcohol or use illicit drugs. Allergies:  Allergies  Allergen Reactions  . Amoxicillin Rash  . Ibuprofen Other (See Comments)    Pt stated ibuprofen made him pass out  . Penicillins Nausea And Vomiting    And a rash   Medications Prior to Admission  Medication Sig Dispense Refill  . dexamethasone (DECADRON) 4 MG/ML injection Inject 4 mg into the vein 3 (three) times daily.    . folic acid (FOLVITE) 1 MG tablet Take 1 mg by mouth daily.    . heparin 5000 UNIT/ML injection Inject 5,000 Units into the skin 3 (three) times daily.    . Multiple Vitamins-Minerals (MULTIVITAMIN WITH MINERALS) tablet Take 1 tablet by mouth daily.    . penicillin v potassium (VEETID) 500 MG tablet Take 500 mg by mouth 4 (four) times daily.    Marland Kitchen thiamine 100 MG tablet Take 100 mg by mouth daily.      Home: Home Living Family/patient expects to be discharged to:: Private residence Living Arrangements: Spouse/significant other Available Help at Discharge: Family, Available 24 hours/day (however spouse doesn't drive as she's legally blind) Type of Home: House Home Access: Stairs to enter [employer] of Steps: 3 Entrance Stairs-Rails: None Home Layout: One level [place]: Environmental consultant - 2 wheels, Cane - single point  Functional History: Prior Function Level of  Independence: Independent Functional Status:  Mobility: Bed Mobility Overal bed mobility: Needs Assistance Bed Mobility: Rolling, Sidelying to Sit, Sit to Supine Rolling: Mod assist Sidelying to sit: Mod assist Sit to supine:  Mod assist General bed mobility comments: guided though all movements, pt unable to maintain attension to task. Transfers Overall transfer level: Needs assistance Equipment used: Rolling walker (2 wheeled) Transfers: Sit to/from Stand Sit to Stand: Mod assist General transfer comment: guiding and cues for impulsivities and hand placement Ambulation/Gait Ambulation/Gait assistance: Mod assist, +2 safety/equipment Ambulation Distance (Feet): 45 Feet Assistive device: Rolling walker (2 wheeled) General Gait Details: unsteady/ataxic throughout, with mild LE collapse on occassion.  Heavy use of the RW and need for assist maneuvering. Gait Pattern/deviations: Step-through pattern, Ataxic Gait velocity: slower    ADL:    Cognition: Cognition Overall Cognitive Status: Impaired/Different from baseline Orientation Level: Oriented X4 Cognition Arousal/Alertness: Lethargic Behavior During Therapy: WFL for tasks assessed/performed Overall Cognitive Status: Impaired/Different from baseline Area of Impairment: Attention Current Attention Level: Sustained  Blood pressure 153/95, pulse 80, temperature 99.3 F (37.4 C), temperature source Oral, resp. rate 24, height 5' 9.6" (1.768 m), weight 92.8 kg (204 lb 9.4 oz), SpO2 90 %. Physical Exam  HENT:  Head: Normocephalic.  Eyes:  Pupils reactive to light  Neck: Normal range of motion. Neck supple. No thyromegaly present.  Cardiovascular: Normal rate and regular rhythm.   Respiratory: Effort normal and breath sounds normal.  GI: Soft. Bowel sounds are normal. He exhibits no distension.  Neurological:  Patient is restless and difficult to sustain attention. He states he is in [place]. He could not provide his age. Follows simple commands  Skin: Skin is warm and dry.  Moderate ataxia left FNF, HS, No evidence of ataxia on RIght side Motor 4/5 in BUE and BLE  Sensation difficult to assess due to mental status Results for orders placed or  performed during the hospital encounter of 05/20/14 (from the past 24 hour(s))  Glucose, capillary     Status: Abnormal   Collection Time: 06/04/14 12:16 PM  Result Value Ref Range   Glucose-Capillary 234 (H) 70 - 99 mg/dL  Glucose, capillary     Status: Abnormal   Collection Time: 06/04/14  4:47 PM  Result Value Ref Range   Glucose-Capillary 138 (H) 70 - 99 mg/dL  Glucose, capillary     Status: Abnormal   Collection Time: 06/04/14  8:01 PM  Result Value Ref Range   Glucose-Capillary 182 (H) 70 - 99 mg/dL  Glucose, capillary     Status: Abnormal   Collection Time: 06/05/14 12:13 AM  Result Value Ref Range   Glucose-Capillary 163 (H) 65 - 99 mg/dL  Glucose, capillary     Status: Abnormal   Collection Time: 06/05/14  3:46 AM  Result Value Ref Range   Glucose-Capillary 195 (H) 65 - 99 mg/dL  CBC WITH DIFFERENTIAL     Status: Abnormal   Collection Time: 06/05/14  5:40 AM  Result Value Ref Range   WBC 16.5 (H) 4.0 - 10.5 K/uL   RBC 4.44 4.22 - 5.81 MIL/uL   Hemoglobin 14.1 13.0 - 17.0 g/dL   HCT 41.6 39.0 - 52.0 %   MCV 93.7 78.0 - 100.0 fL   MCH 31.8 26.0 - 34.0 pg   MCHC 33.9 30.0 - 36.0 g/dL   RDW 13.6 11.5 - 15.5 %   Platelets 89 (L) 150 - 400 K/uL   Neutrophils Relative % 80 (H) 43 - 77 %  Neutro Abs 13.2 (H) 1.7 - 7.7 K/uL   Lymphocytes Relative 11 (L) 12 - 46 %   Lymphs Abs 1.8 0.7 - 4.0 K/uL   Monocytes Relative 9 3 - 12 %   Monocytes Absolute 1.4 (H) 0.1 - 1.0 K/uL   Eosinophils Relative 0 0 - 5 %   Eosinophils Absolute 0.0 0.0 - 0.7 K/uL   Basophils Relative 0 0 - 1 %   Basophils Absolute 0.0 0.0 - 0.1 K/uL   Mr Jeri Cos Wo Contrast  06/03/2014   CLINICAL DATA:  Metastatic lung cancer. Postop cerebellar tumor resection 06/03/2014  EXAM: MRI HEAD WITHOUT AND WITH CONTRAST  TECHNIQUE: Multiplanar, multiecho pulse sequences of the brain and surrounding structures were obtained without and with intravenous contrast.  CONTRAST:  27m MULTIHANCE GADOBENATE DIMEGLUMINE 529  MG/ML IV SOLN  COMPARISON:  MRI 05/27/2014  FINDINGS: Postop resection of left cerebellar tonsil tumor. Blood and fluid are present in the surgical bed and extending into the left cerebellar tonsil. There has been partial resection of the tumor along the inferior margin. The superior margin of tumor remains. Greater than 50% of the tumor has been resected. Enhancing tumor extends into the posterior lateral medulla on the left unchanged from the prior preoperative study. Cerebellar edema surrounding the tumor is present, somewhat improved from the prior study. Mass-effect on the medulla again noted due to tumor and edema.  Negative for acute infarct.  Negative for hydrocephalus. Pneumocephalus is noted with air in the subarachnoid space in the frontal and temporal lobes bilaterally.  No other enhancing lesions are seen postcontrast administration  IMPRESSION: Partial resection of metastatic disease in the left cerebellar tonsil. The superior portion of the tumor remains. Tumor extends into the left posterior lateral medulla also unchanged.  No other tumor deposits identified  Pneumocephalus   Electronically Signed   By: CFranchot GalloM.D.   On: 06/03/2014 21:34   Dg Chest Port 1 View  06/03/2014   CLINICAL DATA:  Right IJ central line placement  EXAM: PORTABLE CHEST - 1 VIEW  COMPARISON:  05/23/2014  FINDINGS: Right IJ central line has been newly placed. Tip is at the SVC. No pneumothorax.  Right upper lobe mass (bronchogenic carcinoma) is stable allowing for lower lung volumes. No cardiomegaly for technique. Stable aortic contours. No pneumonia or edema.  IMPRESSION: The new right IJ catheter is in good position.  No pneumothorax.   Electronically Signed   By: JMonte FantasiaM.D.   On: 06/03/2014 15:05    Assessment/Plan: Diagnosis: Left cerebellar metastatic lesion from lung primary with ataxia, dysarthria, dysphagia and cognitive deficits 1. Does the need for close, 24 hr/day medical supervision in  concert with the patient's rehab needs make it unreasonable for this patient to be served in a less intensive setting? Yes 2. Co-Morbidities requiring supervision/potential complications: Hep C , cirrhosis, ehtanol abuse 3. Due to bladder management, bowel management, safety, skin/wound care, disease management, medication administration, pain management and patient education, does the patient require 24 hr/day rehab nursing? Yes 4. Does the patient require coordinated care of a physician, rehab nurse, PT (1-2 hrs/day, 5 days/week), OT (1-2 hrs/day, 5 days/week) and SLP (.5-1 hrs/day, 5 days/week) to address physical and functional deficits in the context of the above medical diagnosis(es)? Yes Addressing deficits in the following areas: balance, endurance, locomotion, strength, transferring, bowel/bladder control, bathing, dressing, feeding, grooming, toileting, cognition, speech, swallowing and psychosocial support 5. Can the patient actively participate in an intensive therapy program of  at least 3 hrs of therapy per day at least 5 days per week? Potentially 6. The potential for patient to make measurable gains while on inpatient rehab is fair 7. Anticipated functional outcomes upon discharge from inpatient rehab are min assist  with PT, min assist with OT, min assist with SLP. 8. Estimated rehab length of stay to reach the above functional goals is: 18-24d 9. Does the patient have adequate social supports and living environment to accommodate these discharge functional goals? Potentially 10. Anticipated D/C setting: home vs SNF  11. Anticipated post D/C treatments: [place] therapy 12. Overall Rehab/Functional Prognosis: fair  RECOMMENDATIONS: This patient's condition is appropriate for continued rehabilitative care in the following setting: CIR once tolerating and progressing with PT/OT Patient has agreed to participate in recommended program. Potentially Note that insurance prior authorization may  be required for reimbursement for recommended care.  Comment: ?24/7 care avail    06/05/2014

## 2014-06-05 NOTE — Progress Notes (Signed)
Patient ID: Ray Garcia, male   DOB: 1960-05-04, 54 y.o.   MRN: [id number] Vital signs are stable Temperature max 99 4 Complains of feeling out of sorts Motor function appears intact Postoperative scan as noted yesterday moderate amount of residual tumor no hydrocephalus Will need inpatient stay at rehabilitation Will request rehabilitation med consult.

## 2014-06-05 NOTE — Progress Notes (Signed)
Patient c/o inability to urinate. Approx 321m in condom catheter bag. Per documentation foley had been removed at 1000. Patient bladder scanned, 9450mrevealed. 85058mmber urine drained. Will monitor.

## 2014-06-05 NOTE — Progress Notes (Signed)
Nutrition Follow-up  DOCUMENTATION CODES:  Non-severe (moderate) malnutrition in context of acute illness/injury  INTERVENTION:  Ensure Enlive (each supplement provides 350kcal and 20 grams of protein)  NUTRITION DIAGNOSIS:  Increased nutrient needs related to catabolic illness, cancer and cancer related treatments as evidenced by estimated needs.  ongoing  GOAL:  Patient will meet greater than or equal to 90% of their needs  Not met  MONITOR:  PO intake, Supplement acceptance, I & O's  REASON FOR ASSESSMENT:  Malnutrition Screening Tool    ASSESSMENT:  54 y.o. year old male with significant past medical history of cirrhosis, hepatitis C, HTN, dental caries/abscess, ETOH abuse presenting with cerebellar mass.  Pt s/p stereotactic radiosurgery 5/9 and stereotactic suboccipital craniectomy for resection of tumor 5/10.   Pt discussed during ICU rounds and with RN. 5 days since last bm, discussed with RN.  Per RN pt ate 50% of his breakfast without nausea. PT working with pt. Current plan is for rehab once ready. Pt has follow up appt at [facility]  Medications reviewed and include: decadron, colace, folic acid, lantus, MVI, miralax, and florastor   Height:  Ht Readings from Last 1 Encounters:  05/20/14 5' 9.6" (1.768 m)    Weight:  Wt Readings from Last 1 Encounters:  05/20/14 204 lb 9.4 oz (92.8 kg)    Ideal Body Weight:  75.5 kg  Wt Readings from Last 10 Encounters:  05/20/14 204 lb 9.4 oz (92.8 kg)    BMI:  Body mass index is 29.69 kg/(m^2).  Estimated Nutritional Needs:  Kcal:  1791-5056  Protein:  100-115 grams  Fluid:  2.3-2.6 L/day  Skin:  Reviewed, no issues  Diet Order:  Diet heart healthy/carb modified Room service appropriate?: Yes; Fluid consistency:: Thin  EDUCATION NEEDS:  No education needs identified at this time   Intake/Output Summary (Last 24 hours) at 06/05/14 1107 Last data filed at 06/05/14 1100  Gross per 24 hour  Intake    2012 ml  Output   2950 ml  Net   -938 ml    Last BM:  5/7  [place], [place], [place] Pager [phone] After Hours Pager

## 2014-06-05 NOTE — Progress Notes (Signed)
Rehab Admissions Coordinator Note:  Patient was screened by Retta Diones for appropriateness for an Inpatient Acute Rehab Consult.  At this time, an inpatient rehab consult has been ordered and is pending completion.  We will follow up once consult is completed.  Retta Diones 06/05/2014, 2:47 PM  I can be reached at [phone].

## 2014-06-05 NOTE — Anesthesia Postprocedure Evaluation (Signed)
Anesthesia Post Note  Patient: Ray Garcia  Procedure(s) Performed: Procedure(s) (LRB): APPLICATION OF CRANIAL NAVIGATION (N/A) CRANIECTOMY POSTERIOR FOSSA DECOMPRESSION FOR TUMOR  Anesthesia type: General  Patient location: PACU  Post pain: Pain level controlled and Adequate analgesia  Post assessment: Post-op Vital signs reviewed, Patient's Cardiovascular Status Stable, Respiratory Function Stable, Patent Airway and Pain level controlled  Last Vitals:  Filed Vitals:   06/05/14 0900  BP: 123/98  Pulse: 51  Temp:   Resp:     Post vital signs: Reviewed and stable  Level of consciousness: awake, alert  and oriented  Complications: No apparent anesthesia complications

## 2014-06-05 NOTE — Progress Notes (Signed)
Physical Therapy Treatment Patient Details Name: Ray Garcia MRN: [id number] DOB: [birth year] Today's Date: 06/05/2014    History of Present Illness This is a 54 y.o. year old male with significant past medical history of cirrhosis, hepatitis C, HTN, dental caries/abscess, ETOH abuse presenting with cerebellar mass. Pt underwent biopsy and has now been to sx for excision of good portion of the tumor.    PT Comments    Pt now post cerebellar mass resection with lots of pain, ?panic/anxiety, weakness and incoordination.  Emphasis on exercise and mobility/gait.  Follow Up Recommendations  CIR     Equipment Recommendations  Rolling walker with 5" wheels    Recommendations for Other Services       Precautions / Restrictions Precautions Precautions: Fall    Mobility  Bed Mobility Overal bed mobility: Needs Assistance Bed Mobility: Rolling;Sidelying to Sit;Sit to Supine Rolling: Mod assist Sidelying to sit: Mod assist   Sit to supine: Mod assist   General bed mobility comments: guided though all movements, pt unable to maintain attension to task.  Transfers Overall transfer level: Needs assistance Equipment used: Rolling walker (2 wheeled) Transfers: Sit to/from Stand Sit to Stand: Mod assist         General transfer comment: guiding and cues for impulsivities and hand placement  Ambulation/Gait Ambulation/Gait assistance: Mod assist;+2 safety/equipment Ambulation Distance (Feet): 45 Feet Assistive device: Rolling walker (2 wheeled) Gait Pattern/deviations: Step-through pattern;Ataxic Gait velocity: slower   General Gait Details: unsteady/ataxic throughout, with mild LE collapse on occassion.  Heavy use of the RW and need for assist maneuvering.   Stairs            Wheelchair Mobility    Modified Rankin (Stroke Patients Only)       Balance     Sitting balance-Leahy Scale: Poor       Standing balance-Leahy Scale: Poor                       Cognition Arousal/Alertness: Lethargic Behavior During Therapy: WFL for tasks assessed/performed Overall Cognitive Status: Impaired/Different from baseline Area of Impairment: Attention   Current Attention Level: Sustained                Exercises General Exercises - Lower Extremity Straight Leg Raises: AAROM;Strengthening;Both;10 reps;Supine Hip Flexion/Marching: AROM;Strengthening;Both;10 reps;Supine;Other (comment) (graded resistance)    General Comments General comments (skin integrity, edema, etc.): Sats maintained low to mid 90's on 3L [place]      Pertinent Vitals/Pain Pain Assessment: Faces Faces Pain Scale: Hurts whole lot Pain Location: head/back Pain Descriptors / Indicators: Headache;Aching Pain Intervention(s): Monitored during session;Repositioned;Limited activity within patient's tolerance    Home Living                      Prior Function            PT Goals (current goals can now be found in the care plan section) Acute Rehab PT Goals Patient Stated Goal: to get better PT Goal Formulation: With patient Time For Goal Achievement: 06/19/14 Potential to Achieve Goals: Good Progress towards PT goals: Progressing toward goals    Frequency  Min 3X/week    PT Plan Current plan remains appropriate    Co-evaluation             End of Session     Patient left: in bed;with call bell/phone within reach     Time: 1000-1038 PT Time Calculation (min) (ACUTE ONLY): 38 min  Charges:  $Gait Training: 8-22 mins                    G Codes:      Lianni Kanaan, Tessie Fass 06/05/2014, 11:39 AM  06/05/2014  Donnella Sham, [place] [phone]  (pager)

## 2014-06-06 LAB — CBC WITH DIFFERENTIAL/PLATELET
Basophils Absolute: 0 10*3/uL (ref 0.0–0.1)
Basophils Relative: 0 % (ref 0–1)
Eosinophils Absolute: 0 10*3/uL (ref 0.0–0.7)
Eosinophils Relative: 0 % (ref 0–5)
HEMATOCRIT: 38 % — AB (ref 39.0–52.0)
HEMOGLOBIN: 13 g/dL (ref 13.0–17.0)
Lymphocytes Relative: 15 % (ref 12–46)
Lymphs Abs: 1.5 10*3/uL (ref 0.7–4.0)
MCH: 31.3 pg (ref 26.0–34.0)
MCHC: 34.2 g/dL (ref 30.0–36.0)
MCV: 91.6 fL (ref 78.0–100.0)
MONO ABS: 0.8 10*3/uL (ref 0.1–1.0)
MONOS PCT: 8 % (ref 3–12)
NEUTROS PCT: 77 % (ref 43–77)
Neutro Abs: 7.6 10*3/uL (ref 1.7–7.7)
PLATELETS: 89 10*3/uL — AB (ref 150–400)
RBC: 4.15 MIL/uL — AB (ref 4.22–5.81)
RDW: 13.1 % (ref 11.5–15.5)
WBC: 9.9 10*3/uL (ref 4.0–10.5)

## 2014-06-06 LAB — GLUCOSE, CAPILLARY
GLUCOSE-CAPILLARY: 132 mg/dL — AB (ref 65–99)
GLUCOSE-CAPILLARY: 156 mg/dL — AB (ref 65–99)
Glucose-Capillary: 233 mg/dL — ABNORMAL HIGH (ref 65–99)
Glucose-Capillary: 157 mg/dL — ABNORMAL HIGH (ref 65–99)
Glucose-Capillary: 207 mg/dL — ABNORMAL HIGH (ref 65–99)
Glucose-Capillary: 127 mg/dL — ABNORMAL HIGH (ref 65–99)
Glucose-Capillary: 160 mg/dL — ABNORMAL HIGH (ref 65–99)

## 2014-06-06 MED ORDER — FLUCONAZOLE 200 MG PO TABS
200.0000 mg | ORAL_TABLET | Freq: Every day | ORAL | Status: AC
  Administered 2014-06-06 – 2014-06-12 (×7): 200 mg via ORAL
  Filled 2014-06-06 (×7): qty 1

## 2014-06-06 NOTE — Progress Notes (Signed)
Rehab admissions - I met with pt and his wife and family friend in follow up to rehab MD consult. Further information was given about our rehab program and questions were answered. Pt and his wife are interested in pursuing inpatient rehab.  Per rehab MD, we will consider possible inpatient rehab admit "once tolerating and progressing with PT/OT". I spoke with treating therapists today and they stated pt needed repeated redirectioning due to pt's chronic back pain issues, but that he did participate with redirection.  I have also made note of the latest oncology recommendation and at this time, it appears that pt's further oncology care/plan is anticipated to be completed on an outpatient basis. I will review these details with rehab team and monitor for any updates from onc/rad.  I will follow pt's progress and check on pt's status on Monday. Please call me with any questions. Thanks.  Nanetta Batty, PT Rehabilitation Admissions Coordinator [phone]

## 2014-06-06 NOTE — Evaluation (Signed)
Clinical/Bedside Swallow Evaluation Patient Details  Name: Ray Garcia MRN: [id number] Date of Birth: [birth year]  Today's Date: 06/06/2014 Time: SLP Start Time (ACUTE ONLY): 1018 SLP Stop Time (ACUTE ONLY): 1031 SLP Time Calculation (min) (ACUTE ONLY): 13 min  Past Medical History:  Past Medical History  Diagnosis Date  . ETOH abuse   . Dental caries   . Cirrhosis of liver   . Hepatitis     c  . Arthritis    Past Surgical History:  Past Surgical History  Procedure Laterality Date  . Application of cranial navigation N/A 06/03/2014    Procedure: APPLICATION OF CRANIAL NAVIGATION;  Surgeon: Consuella Lose, MD;  Location: [facility];  Service: Neurosurgery;  Laterality: N/A;  . Craniectomy  06/03/2014    Procedure: CRANIECTOMY POSTERIOR FOSSA DECOMPRESSION FOR TUMOR;  Surgeon: Consuella Lose, MD;  Location: [facility];  Service: Neurosurgery;;   HPI:  This is a 54 y.o. year old male with PMH of cirrhosis, hepatitis C, HTN, dental caries/abscess, ETOH abuse, presenting with cerebellar mass. Pt underwent biopsy and is s/p craniectomy for partial resection of tumor. Follow up MRI 5/10 shows that the superior portion of the tumor remains, with extension into the left posterior lateral medulla unchanges. Swallow evaluation requested after RN noted difficulty swallowing at bedside, including coughing and expectoration of eggs from breakfast meal.   Assessment / Plan / Recommendation Clinical Impression  Pt has suspected delayed swallow with resultant wet vocal quality, followed by delayed coughing with thin and nectar thick liquids. Wet vocal quality is not immediately noted after trials of puree, however with delayed coughing, penetration/aspiration cannot be excluded, and RN reports delayed expectoration of eggs during breakfast meal. Given the above as well as known brainstem involvement, recommend to hold POs until FEES can be completed this afternoon.    Aspiration Risk  Severe     Diet Recommendation NPO (pending FEES this afternoon)   Medication Administration: Via alternative means    Other  Recommendations Oral Care Recommendations: Oral care QID   Follow Up Recommendations    TBA pending FEES   Pertinent Vitals/Pain RR peaked at 33 during PO trials, otherwise [facility]    SLP Swallow Goals     Swallow Study Prior Functional Status       General Other Pertinent Information: This is a 54 y.o. year old male with PMH of cirrhosis, hepatitis C, HTN, dental caries/abscess, ETOH abuse, presenting with cerebellar mass. Pt underwent biopsy and is s/p craniectomy for partial resection of tumor. Follow up MRI 5/10 shows that the superior portion of the tumor remains, with extension into the left posterior lateral medulla unchanges. Swallow evaluation requested after RN noted difficulty swallowing at bedside, including coughing and expectoration of eggs from breakfast meal. Type of Study: Bedside swallow evaluation Previous Swallow Assessment: none in chart Diet Prior to this Study: Regular;Thin liquids Temperature Spikes Noted: Yes (99.3) Respiratory Status: Supplemental O2 delivered via (comment) () History of Recent Intubation:  (for surgery 5/10) Behavior/Cognition: Alert;Cooperative;Requires cueing Oral Cavity - Dentition: Poor condition Self-Feeding Abilities: Able to feed self Patient Positioning: Other (comment) (pt wouldn't tolerate upright position 2/2 back pain) Baseline Vocal Quality: Low vocal intensity Volitional Cough: Other (Comment) (pt refused 2/2 pain with coughing)    Oral/Motor/Sensory Function Overall Oral Motor/Sensory Function: Appears within functional limits for tasks assessed   Ice Chips Ice chips: Not tested   Thin Liquid Thin Liquid: Impaired Presentation: Cup;Self Fed Pharyngeal  Phase Impairments: Suspected delayed Swallow;Wet Vocal Quality;Cough -  Delayed    Nectar Thick Nectar Thick Liquid: Impaired Presentation: Cup;Self  Fed Pharyngeal Phase Impairments: Suspected delayed Swallow;Wet Vocal Quality;Cough - Delayed   Honey Thick Honey Thick Liquid: Not tested   Puree Puree: Impaired Presentation: Self Fed;Spoon Pharyngeal Phase Impairments: Suspected delayed Swallow;Cough - Delayed   Solid   Solid: Not tested      Germain Osgood, M.A. CCC-SLP [phone]  Germain Osgood 06/06/2014,10:48 AM

## 2014-06-06 NOTE — Progress Notes (Signed)
Occupational Therapy Evaluation Patient Details Name: Ray Garcia MRN: [id number] DOB: August 13, 1960 Today's Date: 06/06/2014    History of Present Illness This is a 54 y.o. year old male with significant past medical history of cirrhosis, hepatitis C, HTN, dental caries/abscess, ETOH abuse presenting with cerebellar mass. Pt underwent biopsy and has now been to sx for excision of good portion of the tumor.   Clinical Impression   PTA, pt independent with ADL and mobility. Pt currently requires +2 Mod A with mobility and max A with ADL. Pt limited by c/o back pain, nausea and trunkal ataxia. Feel pt is an appropriate CIR candidate. Encourage pain management of back to increase participation with rehab. Discussed recommendations with wife. Will follow acutely to facilitate D/C to CIR and maximize functional level of independence with ADL and mobility.     Follow Up Recommendations  CIR;Supervision/Assistance - 24 hour    Equipment Recommendations  3 in 1 bedside comode;Tub/shower bench    Recommendations for Other Services Rehab consult     Precautions / Restrictions Precautions Precautions: Fall      Mobility Bed Mobility Overal bed mobility: Needs Assistance;+2 for physical assistance Bed Mobility: Supine to Sit Rolling: Mod assist Sidelying to sit: Mod assist;+2 for physical assistance   Sit to supine: +2 for safety/equipment   General bed mobility comments: Pt not wanting to move due to pain and c/o nausea initially. Assist to translate trunk to upright position  Transfers Overall transfer level: Needs assistance Equipment used: Rolling walker (2 wheeled) Transfers: Sit to/from Stand Sit to Stand: Mod assist;+2 physical assistance         General transfer comment: forward poture. mod vc to position sefl in RW    Balance Overall balance assessment: Needs assistance Sitting-balance support: Feet supported;Bilateral upper extremity supported Sitting balance-Leahy  Scale: Poor Sitting balance - Comments: unable to release hands from B thighs to assess. Pt reports this is due to pain.     Standing balance-Leahy Scale: Poor Standing balance comment: heavy reliance on RW. unable to achieve upright posutre independently                            ADL Overall ADL's : Needs assistance/impaired Eating/Feeding: NPO   Grooming: Minimal assistance Grooming Details (indicate cue type and reason): limited bypai and c/o nausea Upper Body Bathing: Moderate assistance   Lower Body Bathing: Maximal assistance   Upper Body Dressing : Moderate assistance   Lower Body Dressing: Maximal assistance;Sit to/from stand   Toilet Transfer: +2 for physical assistance;Moderate assistance;Ambulation Toilet Transfer Details (indicate cue type and reason): simulated   Toileting - Clothing Manipulation Details (indicate cue type and reason): Pt using urinal with set up      Functional mobility during ADLs: +2 for physical assistance;Moderate assistance General ADL Comments: appears limited by trunkal ataxia, pain and c/o nausea     Vision Vision Assessment?: Vision impaired- to be further tested in functional context Additional Comments: nystagmus present in R/L gaze. ROM appears WNL. will further assess ? Blurring of vision. Educated pt to use "focal point" to reduce nausea             Pertinent Vitals/Pain Pain Assessment: Faces Faces Pain Scale: Hurts whole lot Pain Location: back/legs Pain Descriptors / Indicators: Aching;Discomfort;Grimacing;Guarding;Moaning Pain Intervention(s): Limited activity within patient's tolerance;Monitored during session;Repositioned     Hand Dominance Right   Extremity/Trunk Assessment Upper Extremity Assessment Upper Extremity Assessment: Generalized weakness  Lower Extremity Assessment Lower Extremity Assessment: Defer to PT evaluation   Cervical / Trunk Assessment Cervical / Trunk Assessment: Other  exceptions (trunkal ataxia) Cervical / Trunk Exceptions: ataxia   Communication Communication Communication: Expressive difficulties (slow/different voice quality)   Cognition Arousal/Alertness: Awake/alert Behavior During Therapy: Impulsive;Restless Overall Cognitive Status: Impaired/Different from baseline Area of Impairment: Orientation;Attention;Memory;Following commands;Safety/judgement;Awareness;Problem solving Orientation Level: Disoriented to;Time Current Attention Level: Selective Memory: Decreased short-term memory Following Commands: Follows one step commands consistently Safety/Judgement: Decreased awareness of safety Awareness: Emergent Problem Solving: Slow processing General Comments: Pt limited by back pain. Pain also appears to internally distract pt   General Comments   Pt restless when OOB. C/o back pain. Wife states back pain is long standing problem, but that pt functioned well PTA.                 Home Living Family/patient expects to be discharged to:: Inpatient rehab                                        Prior Functioning/Environment Level of Independence: Independent             OT Diagnosis: Generalized weakness;Cognitive deficits;Disturbance of vision;Acute pain;Ataxia   OT Problem List: Decreased strength;Decreased activity tolerance;Impaired balance (sitting and/or standing);Impaired vision/perception;Decreased coordination;Decreased cognition;Decreased safety awareness;Decreased knowledge of use of DME or AE;Decreased knowledge of precautions;Cardiopulmonary status limiting activity;Pain   OT Treatment/Interventions: Self-care/ADL training;Therapeutic exercise;Neuromuscular education;DME and/or AE instruction;Therapeutic activities;Cognitive remediation/compensation;Visual/perceptual remediation/compensation;Patient/family education;Balance training    OT Goals(Current goals can be found in the care plan section) Acute Rehab  OT Goals Patient Stated Goal: to get better OT Goal Formulation: With patient/family Time For Goal Achievement: 06/20/14 Potential to Achieve Goals: Good  OT Frequency: Min 3X/week   Barriers to D/C:            Co-evaluation PT/OT/SLP Co-Evaluation/Treatment: Yes Reason for Co-Treatment: For patient/therapist safety;Necessary to address cognition/behavior during functional activity   OT goals addressed during session: ADL's and self-care;Other (comment) (mobility)      End of Session Equipment Utilized During Treatment: Gait belt;Rolling walker;Oxygen (4L) Nurse Communication: Mobility status  Activity Tolerance: Patient tolerated treatment well Patient left: in chair;with call bell/phone within reach;with family/visitor present   Time: 4944-9675 OT Time Calculation (min): 46 min Charges:  OT Evaluation $Initial OT Evaluation Tier I: 1 Procedure G-Codes:    Ray Garcia,HILLARY 07-Jun-2014, 1:36 PM   [facility], OTR/L  [phone] 06-07-2014

## 2014-06-06 NOTE — Progress Notes (Signed)
UR completed.  Pt being evaluated for inpatient rehab stay before d/c home.  Sandi Mariscal, RN BSN [facility]CM Trauma/Neuro ICU Case Manager [phone]

## 2014-06-06 NOTE — Progress Notes (Signed)
Discussed plaques in throat with Dr. Joya Salm and pharmacy.  Recommendation from pharmacy is fluconazole 200 mg daily for 7-14 days, depending on resolution of symptoms.

## 2014-06-06 NOTE — Progress Notes (Signed)
Patient ID: Ray Garcia Surgeon, male   DOB: [birth year], 54 y.o.   MRN: [id number] Neuro stable, alert, f/c. Barium swallow pending

## 2014-06-06 NOTE — Progress Notes (Signed)
   06/06/14 1302  PT Visit Information  Last PT Received On 06/06/14  Assistance Needed +2  History of Present Illness This is a 54 y.o. year old male with significant past medical history of cirrhosis, hepatitis C, HTN, dental caries/abscess, ETOH abuse presenting with cerebellar mass. Pt underwent biopsy and has now been to sx for excision of good portion of the tumor.  PT Time Calculation  PT Start Time (ACUTE ONLY) 1118  PT Stop Time (ACUTE ONLY) 1204  PT Time Calculation (min) (ACUTE ONLY) 46 min  Subjective Data  Subjective My back hurts...my legs are killing me...like a charlie horse  Patient Stated Goal to get better  Precautions  Precautions Fall  Pain Assessment  Pain Assessment Faces  Faces Pain Scale 8  Pain Location back/legs worse than neck incision  Pain Descriptors / Indicators Aching;Cramping  Pain Intervention(s) Monitored during session;Repositioned;Heat applied  Cognition  Arousal/Alertness Awake/alert  Behavior During Therapy WFL for tasks assessed/performed  Overall Cognitive Status Impaired/Different from baseline  Area of Impairment Attention  Current Attention Level Selective  Memory Decreased short-term memory  Bed Mobility  Overal bed mobility Needs Assistance  Bed Mobility Rolling;Sidelying to Sit  Rolling Mod assist  Sidelying to sit Mod assist  Sit to supine +2 for safety/equipment  General bed mobility comments Cued for sequencing, redirection to task due to decreased coping with pain  Transfers  Overall transfer level Needs assistance  Equipment used Rolling walker (2 wheeled)  Transfers Sit to/from Stand  Sit to Stand Mod assist  General transfer comment cues for hand placement, assist to com forward more than lift  Ambulation/Gait  Ambulation/Gait assistance Min assist;+2 physical assistance;+2 safety/equipment  Ambulation Distance (Feet) 130 Feet  Assistive device Rolling walker (2 wheeled)  General Gait Details more noticeable truncal  ataxia today with unsteadiness, but not as much LE ataxia ?due to extra truncal assist today.  Gait Pattern/deviations Step-through pattern;Ataxic  Gait velocity slower  Gait velocity interpretation Below normal speed for age/gender  Balance  Overall balance assessment Needs assistance  Sitting-balance support Bilateral upper extremity supported;Feet supported  Sitting balance-Leahy Scale Poor  Sitting balance - Comments pt not wanting to decrease support of both UE to do a task at EOB due to increased pain.  Standing balance-Leahy Scale Poor  Standing balance comment heavy reliance on RW. unable to achieve upright posutre independently  PT - End of Session  Activity Tolerance Patient tolerated treatment well  Patient left in chair;with call bell/phone within reach  Nurse Communication Mobility status  PT - Assessment/Plan  PT Plan Current plan remains appropriate  PT Frequency (ACUTE ONLY) Min 3X/week  Follow Up Recommendations CIR  PT equipment Rolling walker with 5" wheels  PT Goal Progression  Progress towards PT goals Progressing toward goals  Acute Rehab PT Goals  PT Goal Formulation With patient  Time For Goal Achievement 06/19/14  Potential to Achieve Goals Good  PT General Charges  $$ ACUTE PT VISIT 1 Procedure  PT Treatments  $Gait Training 8-22 mins  $Therapeutic Activity 8-22 mins  06/06/2014  Donnella Sham, PT [phone]  (pager)

## 2014-06-06 NOTE — Procedures (Signed)
Objective Swallowing Evaluation:  (FEES)  Patient Details  Name: Ray Garcia MRN: [id number] Date of Birth: 24-Aug-1960  Today's Date: 06/06/2014 Time: SLP Start Time (ACUTE ONLY): 1330-SLP Stop Time (ACUTE ONLY): 1400 SLP Time Calculation (min) (ACUTE ONLY): 30 min  Past Medical History:  Past Medical History  Diagnosis Date  . ETOH abuse   . Dental caries   . Cirrhosis of liver   . Hepatitis     c  . Arthritis    Past Surgical History:  Past Surgical History  Procedure Laterality Date  . Application of cranial navigation N/A 06/03/2014    Procedure: APPLICATION OF CRANIAL NAVIGATION;  Surgeon: Consuella Lose, MD;  Location: [place] [facility];  Service: Neurosurgery;  Laterality: N/A;  . Craniectomy  06/03/2014    Procedure: CRANIECTOMY POSTERIOR FOSSA DECOMPRESSION FOR TUMOR;  Surgeon: Consuella Lose, MD;  Location: [facility];  Service: Neurosurgery;;   HPI:  Other Pertinent Information: This is a 54 y.o. year old male with PMH of cirrhosis, hepatitis C, HTN, dental caries/abscess, ETOH abuse, presenting with cerebellar mass. Pt underwent biopsy and is s/p craniectomy for partial resection of tumor. Follow up MRI 5/10 shows that the superior portion of the tumor remains, with extension into the left posterior lateral medulla unchanges. Swallow evaluation requested after RN noted difficulty swallowing at bedside, including coughing and expectoration of eggs from breakfast meal.  No Data Recorded  Assessment / Plan / Recommendation CHL IP CLINICAL IMPRESSIONS 06/06/2014  Therapy Diagnosis Moderate pharyngeal phase dysphagia  Clinical Impression Pt demonstrates a moderate oropharyngeal dysphagia due to paralysis of left arytenoid and reduced airway closure during the swallow. Pt penetrates all liquid consistencies to the cords, and possibly silently aspirates, though no aspirate was expectorated with cued coughs.  Pt could not tolerate discomfort from FEES long enough to attempt  postures to reduce penetration. There are also white plaques concerning for candida throughout the oropharynx and larynx, likely down the esophagus as well contributing to pts dysphonia and pain with swallow. Recommend pt initiate puree solids (dys 1) and pudding thick liquids (no real liquids). Also suggest IV treatment for candida since oral rinse will not effectively treat the laryngeal tissue. Will plan for repeat objective testing as pt reports improvement in pain with swallow.       CHL IP TREATMENT RECOMMENDATION 06/06/2014  Treatment Recommendations Therapy as outlined in treatment plan below     CHL IP DIET RECOMMENDATION 06/06/2014  SLP Diet Recommendations Dysphagia 1 (Puree);Pudding  Liquid Administration via (None)  Medication Administration Crushed with puree  Compensations (None)  Postural Changes and/or Swallow Maneuvers (None)     CHL IP OTHER RECOMMENDATIONS 06/06/2014  Recommended Consults (None)  Oral Care Recommendations Oral care QID  Other Recommendations Prohibited food (jello, ice cream, thin soups);Order thickener from pharmacy     No flowsheet data found.   CHL IP FREQUENCY AND DURATION 06/06/2014  Speech Therapy Frequency (ACUTE ONLY) (None)  Treatment Duration 2 weeks     Pertinent Vitals/Pain NA    SLP Swallow Goals No flowsheet data found.  No flowsheet data found.    CHL IP REASON FOR REFERRAL 06/06/2014  Reason for Referral Objectively evaluate swallowing function     CHL IP ORAL PHASE 06/06/2014  Lips (None)  Tongue (None)  Mucous membranes (None)  Nutritional status (None)  Other (None)  Oxygen therapy (None)  Oral Phase WFL  Oral - Pudding Teaspoon (None)  Oral - Pudding Cup (None)  Oral - Honey Teaspoon (None)  Oral - Honey Cup (None)  Oral - Honey Syringe (None)  Oral - Nectar Teaspoon (None)  Oral - Nectar Cup (None)  Oral - Nectar Straw (None)  Oral - Nectar Syringe (None)  Oral - Ice Chips (None)  Oral - Thin Teaspoon (None)   Oral - Thin Cup (None)  Oral - Thin Straw (None)  Oral - Thin Syringe (None)  Oral - Puree (None)  Oral - Mechanical Soft (None)  Oral - Regular (None)  Oral - Multi-consistency (None)  Oral - Pill (None)  Oral Phase - Comment (None)      CHL IP PHARYNGEAL PHASE 06/06/2014  Pharyngeal Phase Impaired  Pharyngeal - Pudding Teaspoon (None)  Penetration/Aspiration details (pudding teaspoon) (None)  Pharyngeal - Pudding Cup (None)  Penetration/Aspiration details (pudding cup) (None)  Pharyngeal - Honey Teaspoon (None)  Penetration/Aspiration details (honey teaspoon) (None)  Pharyngeal - Honey Cup (None)  Penetration/Aspiration details (honey cup) (None)  Pharyngeal - Honey Syringe (None)  Penetration/Aspiration details (honey syringe) (None)  Pharyngeal - Nectar Teaspoon (None)  Penetration/Aspiration details (nectar teaspoon) (None)  Pharyngeal - Nectar Cup (None)  Penetration/Aspiration details (nectar cup) (None)  Pharyngeal - Nectar Straw (None)  Penetration/Aspiration details (nectar straw) (None)  Pharyngeal - Nectar Syringe (None)  Penetration/Aspiration details (nectar syringe) (None)  Pharyngeal - Ice Chips (None)  Penetration/Aspiration details (ice chips) (None)  Pharyngeal - Thin Teaspoon (None)  Penetration/Aspiration details (thin teaspoon) (None)  Pharyngeal - Thin Cup (None)  Penetration/Aspiration details (thin cup) (None)  Pharyngeal - Thin Straw (None)  Penetration/Aspiration details (thin straw) (None)  Pharyngeal - Thin Syringe (None)  Penetration/Aspiration details (thin syringe') (None)  Pharyngeal - Puree (None)  Penetration/Aspiration details (puree) (None)  Pharyngeal - Mechanical Soft (None)  Penetration/Aspiration details (mechanical soft) (None)  Pharyngeal - Regular (None)  Penetration/Aspiration details (regular) (None)  Pharyngeal - Multi-consistency (None)  Penetration/Aspiration details (multi-consistency) (None)  Pharyngeal - Pill (None)   Penetration/Aspiration details (pill) (None)  Pharyngeal Comment (None)      No flowsheet data found.  No flowsheet data found.        Herbie [place], [place] CCC-SLP [phone]  Lynann Beaver 06/06/2014, 2:37 PM

## 2014-06-06 NOTE — H&P (Signed)
Physical Medicine and Rehabilitation Admission H&P     HPI: Ray Garcia is a 54 y.o. right handed male with history of cirrhosis, hepatitis C hypertension and alcohol abuse. Patient lives with spouse independent with single-point cane prior to admission. Presented 05/20/2014 from outside hospital with recurrent intermittent headaches as well as nausea and dizziness 2 weeks. Cranial CT scan at outside hospital showed abnormal appearance of left cerebellar hemisphere concerning for underlying mass. Discharged to [facility] for further evaluation. Underwent CT chest abdomen pelvis showing a 7.7 cm mass located in the right upper lobe. Underwent CT-guided right upper lobe mass biopsy. MRI of the brain showed a 32 x 25 x 37 mm densely cellular left cerebellar mass extending into the left inferior peduncle and medulla. Underwent stereotactic radiosurgery 06/02/2014 with suboccipital craniectomy resection of tumor per Dr. Kathyrn Sheriff. Maintained on Decadron protocol. Oncology services [place] consulted. 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 Noted thrombocytopenia likely related to malignancy. Maintained on a regular diet. Physical therapy evaluations completed and ongoing with recommendations of physical medicine rehabilitation consult. Patient was admitted for a comprehensive rehabilitation program   ROS Review of Systems  Unable to perform ROS: mental acuity    Past Medical History  Diagnosis Date  . ETOH abuse   . Dental caries   . Cirrhosis of liver   . Hepatitis     c  . Arthritis    Past Surgical History  Procedure Laterality Date  . Application of cranial navigation N/A 06/03/2014    Procedure: APPLICATION OF CRANIAL NAVIGATION;  Surgeon: Consuella Lose, MD;  Location: [facility]UR[facility] OR[facility];  Service: Neurosurgery;  Laterality: N/A;  . Craniectomy  06/03/2014    Procedure: CRANIECTOMY POSTERIOR  FOSSA DECOMPRESSION FOR TUMOR;  Surgeon: Consuella Lose, MD;  Location: [facility] NEURO ORS;  Service: Neurosurgery;;   History reviewed. No pertinent family history. Social History:  reports that he quit smoking about 6 weeks ago. His smoking use included Cigarettes. He has quit using smokeless tobacco. He reports that he does not drink alcohol or use illicit drugs. Allergies:  Allergies  Allergen Reactions  . Amoxicillin Rash  . Ibuprofen Other (See Comments)    Pt stated ibuprofen made him pass out  . Penicillins Nausea And Vomiting    And a rash   Medications Prior to Admission  Medication Sig Dispense Refill  . dexamethasone (DECADRON) 4 MG/ML injection Inject 4 mg into the vein 3 (three) times daily.    . folic acid (FOLVITE) 1 MG tablet Take 1 mg by mouth daily.    . heparin 5000 UNIT/ML injection Inject 5,000 Units into the skin 3 (three) times daily.    . Multiple Vitamins-Minerals (MULTIVITAMIN WITH MINERALS) tablet Take 1 tablet by mouth daily.    . penicillin v potassium (VEETID) 500 MG tablet Take 500 mg by mouth 4 (four) times daily.    Marland Kitchen thiamine 100 MG tablet Take 100 mg by mouth daily.      Home: Home Living Family/patient expects to be discharged to:: Private residence Living Arrangements: Spouse/significant other Available Help at Discharge: Family, Available 24 hours/day (however spouse doesn't drive as she's legally blind) Type of Home: House Home Access: Stairs to enter [employer] of Steps: 3 Entrance Stairs-Rails: None Home Layout: One level [place]: Environmental consultant - 2 wheels, [place] - single point   Functional History: Prior Function Level of Independence: Independent  Functional Status:  Mobility: Bed Mobility  Overal bed mobility: Needs Assistance Bed Mobility: Rolling, Sidelying to Sit, Sit to Supine Rolling: Mod assist Sidelying to sit: Mod assist Sit to supine: Mod assist General bed mobility comments: guided though all movements, pt  unable to maintain attension to task. Transfers Overall transfer level: Needs assistance Equipment used: Rolling walker (2 wheeled) Transfers: Sit to/from Stand Sit to Stand: Mod assist General transfer comment: guiding and cues for impulsivities and hand placement Ambulation/Gait Ambulation/Gait assistance: Mod assist, +2 safety/equipment Ambulation Distance (Feet): 45 Feet Assistive device: Rolling walker (2 wheeled) General Gait Details: unsteady/ataxic throughout, with mild LE collapse on occassion.  Heavy use of the RW and need for assist maneuvering. Gait Pattern/deviations: Step-through pattern, Ataxic Gait velocity: slower    ADL:    Cognition: Cognition Overall Cognitive Status: Impaired/Different from baseline Orientation Level: Oriented X4 Cognition Arousal/Alertness: Lethargic Behavior During Therapy: WFL for tasks assessed/performed Overall Cognitive Status: Impaired/Different from baseline Area of Impairment: Attention Current Attention Level: Sustained  Physical Exam: Blood pressure 150/96, pulse 77, temperature 98 F (36.7 C), temperature source Oral, resp. rate 15, height 5' 9.6" (1.768 m), weight 92.8 kg (204 lb 9.4 oz), SpO2 95 %. Physical Exam HENT: dentition fair Head: Normocephalic.  Eyes:  Pupils reactive to light  Neck: Normal range of motion. Neck supple. No thyromegaly present.  Cardiovascular: Normal rate and regular rhythm. no murmurs Respiratory: Effort normal and breath sounds normal. No wheezes GI: Soft. Bowel sounds are normal. He exhibits no distension.  Musculo: tenderness along left rib cage with palpation.  Neurological:  Patient is restless and has limited attention. He states he is in [place]. He could not provide his age. Follows simple commands.Displays limb ataxia left FNF, HS, No evidence of ataxia on RIght side.  Motor 4/5 in BUE and BLE prox to distal. Senses pain in all 4's. Psych: restless and distracted.   Results for  orders placed or performed during the hospital encounter of 05/20/14 (from the past 48 hour(s))  CBC WITH DIFFERENTIAL     Status: Abnormal   Collection Time: 06/04/14  6:30 AM  Result Value Ref Range   WBC 16.9 (H) 4.0 - 10.5 K/uL   RBC 4.82 4.22 - 5.81 MIL/uL   Hemoglobin 15.4 13.0 - 17.0 g/dL   HCT 45.1 39.0 - 52.0 %   MCV 93.6 78.0 - 100.0 fL   MCH 32.0 26.0 - 34.0 pg   MCHC 34.1 30.0 - 36.0 g/dL   RDW 13.9 11.5 - 15.5 %   Platelets 98 (L) 150 - 400 K/uL    Comment: CONSISTENT WITH PREVIOUS RESULT   Neutrophils Relative % 78 (H) 43 - 77 %   Neutro Abs 13.1 (H) 1.7 - 7.7 K/uL   Lymphocytes Relative 12 12 - 46 %   Lymphs Abs 2.0 0.7 - 4.0 K/uL   Monocytes Relative 10 3 - 12 %   Monocytes Absolute 1.7 (H) 0.1 - 1.0 K/uL   Eosinophils Relative 0 0 - 5 %   Eosinophils Absolute 0.0 0.0 - 0.7 K/uL   Basophils Relative 0 0 - 1 %   Basophils Absolute 0.0 0.0 - 0.1 K/uL  Basic metabolic panel     Status: Abnormal   Collection Time: 06/04/14  6:30 AM  Result Value Ref Range   Sodium 138 135 - 145 mmol/L   Potassium 4.1 3.5 - 5.1 mmol/L   Chloride 102 101 - 111 mmol/L   CO2 27 22 - 32 mmol/L   Glucose, Bld 166 (H) 70 -  99 mg/dL   BUN 24 (H) 6 - 20 mg/dL   Creatinine, Ser 0.72 0.61 - 1.24 mg/dL   Calcium 8.7 (L) 8.9 - 10.3 mg/dL   GFR calc non Af Amer >60 >60 mL/min   GFR calc Af Amer >60 >60 mL/min    Comment: (NOTE) The eGFR has been calculated using the CKD EPI equation. This calculation has not been validated in all clinical situations. eGFR's persistently <60 mL/min signify possible Chronic Kidney Disease.    Anion gap 9 5 - 15  Glucose, capillary     Status: Abnormal   Collection Time: 06/04/14  8:20 AM  Result Value Ref Range   Glucose-Capillary 164 (H) 70 - 99 mg/dL   Comment 1 Notify RN    Comment 2 Document in Chart   Glucose, capillary     Status: Abnormal   Collection Time: 06/04/14 12:16 PM  Result Value Ref Range   Glucose-Capillary 234 (H) 70 - 99 mg/dL    Glucose, capillary     Status: Abnormal   Collection Time: 06/04/14  4:47 PM  Result Value Ref Range   Glucose-Capillary 138 (H) 70 - 99 mg/dL  Glucose, capillary     Status: Abnormal   Collection Time: 06/04/14  8:01 PM  Result Value Ref Range   Glucose-Capillary 182 (H) 70 - 99 mg/dL  Glucose, capillary     Status: Abnormal   Collection Time: 06/05/14 12:13 AM  Result Value Ref Range   Glucose-Capillary 163 (H) 65 - 99 mg/dL  Glucose, capillary     Status: Abnormal   Collection Time: 06/05/14  3:46 AM  Result Value Ref Range   Glucose-Capillary 195 (H) 65 - 99 mg/dL  CBC WITH DIFFERENTIAL     Status: Abnormal   Collection Time: 06/05/14  5:40 AM  Result Value Ref Range   WBC 16.5 (H) 4.0 - 10.5 K/uL   RBC 4.44 4.22 - 5.81 MIL/uL   Hemoglobin 14.1 13.0 - 17.0 g/dL   HCT 41.6 39.0 - 52.0 %   MCV 93.7 78.0 - 100.0 fL   MCH 31.8 26.0 - 34.0 pg   MCHC 33.9 30.0 - 36.0 g/dL   RDW 13.6 11.5 - 15.5 %   Platelets 89 (L) 150 - 400 K/uL    Comment: CONSISTENT WITH PREVIOUS RESULT   Neutrophils Relative % 80 (H) 43 - 77 %   Neutro Abs 13.2 (H) 1.7 - 7.7 K/uL   Lymphocytes Relative 11 (L) 12 - 46 %   Lymphs Abs 1.8 0.7 - 4.0 K/uL   Monocytes Relative 9 3 - 12 %   Monocytes Absolute 1.4 (H) 0.1 - 1.0 K/uL   Eosinophils Relative 0 0 - 5 %   Eosinophils Absolute 0.0 0.0 - 0.7 K/uL   Basophils Relative 0 0 - 1 %   Basophils Absolute 0.0 0.0 - 0.1 K/uL  Glucose, capillary     Status: Abnormal   Collection Time: 06/05/14  7:42 AM  Result Value Ref Range   Glucose-Capillary 171 (H) 65 - 99 mg/dL  Glucose, capillary     Status: Abnormal   Collection Time: 06/05/14  1:05 PM  Result Value Ref Range   Glucose-Capillary 165 (H) 65 - 99 mg/dL  Glucose, capillary     Status: Abnormal   Collection Time: 06/05/14  3:34 PM  Result Value Ref Range   Glucose-Capillary 119 (H) 65 - 99 mg/dL  Glucose, capillary     Status: Abnormal   Collection Time: 06/05/14  8:41 PM  Result Value Ref Range    Glucose-Capillary 233 (H) 65 - 99 mg/dL  Glucose, capillary     Status: Abnormal   Collection Time: 06/06/14 12:28 AM  Result Value Ref Range   Glucose-Capillary 156 (H) 65 - 99 mg/dL   No results found.     Medical Problem List and Plan: 1. Functional deficits secondary to left cerebellar metastatic lung cancer status post stereotactic radiosurgery suboccipital craniectomy 06/02/2014 2.  DVT Prophylaxis/Anticoagulation: SCDs. Monitor for any signs of DVT 3. Pain Management: Oxycodone as needed. 4. Mood/agitation: Ativan as needed. Consider scheduled Seroquel 5. Neuropsych: This patient is not capable of making decisions on his own behalf. 6. Skin/Wound Care: Routine skin checks 7. Fluids/Electrolytes/Nutrition: Strict I and O's with follow-up chemistries 8. Hyperglycemia secondary to Decadron. Check blood sugars before meals and at bedtime 9. Thrombocytopenia likely secondary to malignancy. Follow-up CBC 10. Hepatitis C/alcohol abuse. Monitor for withdrawal   Post Admission Physician Evaluation: 1. Functional deficits secondary  to metastatic lung cancer to the left cerebellum/brainstem. 2. Patient is admitted to receive collaborative, interdisciplinary care between the physiatrist, rehab nursing staff, and therapy team. 3. Patient's level of medical complexity and substantial therapy needs in context of that medical necessity cannot be provided at a lesser intensity of care such as a SNF. 4. Patient has experienced substantial functional loss from his/her baseline which was documented above under the "Functional History" and "Functional Status" headings.  Judging by the patient's diagnosis, physical exam, and functional history, the patient has potential for functional progress which will result in measurable gains while on inpatient rehab.  These gains will be of substantial and practical use upon discharge  in facilitating mobility and self-care at the household  level. 5. Physiatrist will provide 24 hour management of medical needs as well as oversight of the therapy plan/treatment and provide guidance as appropriate regarding the interaction of the two. 6. 24 hour rehab nursing will assist with bladder management, bowel management, safety, skin/wound care, disease management, medication administration, pain management and patient education  and help integrate therapy concepts, techniques,education, etc. 7. PT will assess and treat for/with: Lower extremity strength, range of motion, stamina, balance, functional mobility, safety, adaptive techniques and equipment, NMR, vestibular assessment, pain control, ego support, family ed.   Goals are: supervision to min assist. 8. OT will assess and treat for/with: ADL's, functional mobility, safety, upper extremity strength, adaptive techniques and equipment, NMR, pain control, ego support, community reintegration.   Goals are: supervision to min assist. Therapy may not proceed with showering this patient. 9. SLP will assess and treat for/with: cognition, communication.  Goals are: supervision. 10. Case Management and Social Worker will assess and treat for psychological issues and discharge planning. 11. Team conference will be held weekly to assess progress toward goals and to determine barriers to discharge. 12. Patient will receive at least 3 hours of therapy per day at least 5 days per week. 13. ELOS: 10-14 days       14. Prognosis:  excellent     Meredith Staggers, MD, [place] [facility] 06/09/2014   06/06/2014

## 2014-06-07 LAB — CBC WITH DIFFERENTIAL/PLATELET
BASOS ABS: 0 10*3/uL (ref 0.0–0.1)
Basophils Relative: 0 % (ref 0–1)
Eosinophils Absolute: 0 10*3/uL (ref 0.0–0.7)
Eosinophils Relative: 0 % (ref 0–5)
HCT: 40.1 % (ref 39.0–52.0)
Hemoglobin: 14.1 g/dL (ref 13.0–17.0)
LYMPHS ABS: 1.2 10*3/uL (ref 0.7–4.0)
Lymphocytes Relative: 13 % (ref 12–46)
MCH: 31.6 pg (ref 26.0–34.0)
MCHC: 35.2 g/dL (ref 30.0–36.0)
MCV: 89.9 fL (ref 78.0–100.0)
MONOS PCT: 9 % (ref 3–12)
Monocytes Absolute: 0.8 10*3/uL (ref 0.1–1.0)
Neutro Abs: 7.4 10*3/uL (ref 1.7–7.7)
Neutrophils Relative %: 78 % — ABNORMAL HIGH (ref 43–77)
PLATELETS: 131 10*3/uL — AB (ref 150–400)
RBC: 4.46 MIL/uL (ref 4.22–5.81)
RDW: 12.8 % (ref 11.5–15.5)
WBC: 9.4 10*3/uL (ref 4.0–10.5)

## 2014-06-07 LAB — GLUCOSE, CAPILLARY
GLUCOSE-CAPILLARY: 164 mg/dL — AB (ref 65–99)
GLUCOSE-CAPILLARY: 206 mg/dL — AB (ref 65–99)
Glucose-Capillary: 143 mg/dL — ABNORMAL HIGH (ref 65–99)
Glucose-Capillary: 154 mg/dL — ABNORMAL HIGH (ref 65–99)
Glucose-Capillary: 176 mg/dL — ABNORMAL HIGH (ref 65–99)
Glucose-Capillary: 213 mg/dL — ABNORMAL HIGH (ref 65–99)
Glucose-Capillary: 239 mg/dL — ABNORMAL HIGH (ref 65–99)

## 2014-06-07 MED ORDER — BISACODYL 10 MG RE SUPP
10.0000 mg | Freq: Every day | RECTAL | Status: DC | PRN
  Administered 2014-06-07 – 2014-06-12 (×2): 10 mg via RECTAL
  Filled 2014-06-07 (×2): qty 1

## 2014-06-07 NOTE — Progress Notes (Signed)
Patient ID: Ray Garcia, male   DOB: 30-Jul-1960, 54 y.o.   MRN: [id number] NEURO STABLE. MOVES ALL 4 EXTREMITIES. Continue pt/ot.

## 2014-06-08 ENCOUNTER — Inpatient Hospital Stay (HOSPITAL_COMMUNITY): Payer: Medicaid Other

## 2014-06-08 ENCOUNTER — Encounter (HOSPITAL_COMMUNITY): Payer: Self-pay | Admitting: Radiology

## 2014-06-08 DIAGNOSIS — C7931 Secondary malignant neoplasm of brain: Secondary | ICD-10-CM | POA: Insufficient documentation

## 2014-06-08 DIAGNOSIS — R918 Other nonspecific abnormal finding of lung field: Secondary | ICD-10-CM | POA: Insufficient documentation

## 2014-06-08 DIAGNOSIS — R7981 Abnormal blood-gas level: Secondary | ICD-10-CM | POA: Insufficient documentation

## 2014-06-08 DIAGNOSIS — M79609 Pain in unspecified limb: Secondary | ICD-10-CM

## 2014-06-08 DIAGNOSIS — G9389 Other specified disorders of brain: Secondary | ICD-10-CM | POA: Insufficient documentation

## 2014-06-08 DIAGNOSIS — R0902 Hypoxemia: Secondary | ICD-10-CM | POA: Insufficient documentation

## 2014-06-08 LAB — CBC WITH DIFFERENTIAL/PLATELET
Basophils Absolute: 0 10*3/uL (ref 0.0–0.1)
Basophils Relative: 0 % (ref 0–1)
Eosinophils Absolute: 0 10*3/uL (ref 0.0–0.7)
Eosinophils Relative: 0 % (ref 0–5)
HCT: 38.3 % — ABNORMAL LOW (ref 39.0–52.0)
HEMOGLOBIN: 13.8 g/dL (ref 13.0–17.0)
Lymphocytes Relative: 10 % — ABNORMAL LOW (ref 12–46)
Lymphs Abs: 1.1 10*3/uL (ref 0.7–4.0)
MCH: 31.9 pg (ref 26.0–34.0)
MCHC: 36 g/dL (ref 30.0–36.0)
MCV: 88.7 fL (ref 78.0–100.0)
MONO ABS: 1.1 10*3/uL — AB (ref 0.1–1.0)
Monocytes Relative: 9 % (ref 3–12)
Neutro Abs: 9.7 10*3/uL — ABNORMAL HIGH (ref 1.7–7.7)
Neutrophils Relative %: 81 % — ABNORMAL HIGH (ref 43–77)
PLATELETS: 140 10*3/uL — AB (ref 150–400)
RBC: 4.32 MIL/uL (ref 4.22–5.81)
RDW: 12.6 % (ref 11.5–15.5)
WBC: 11.9 10*3/uL — ABNORMAL HIGH (ref 4.0–10.5)

## 2014-06-08 LAB — BASIC METABOLIC PANEL
Anion gap: 13 (ref 5–15)
BUN: 20 mg/dL (ref 6–20)
CO2: 21 mmol/L — ABNORMAL LOW (ref 22–32)
Calcium: 8.9 mg/dL (ref 8.9–10.3)
Chloride: 98 mmol/L — ABNORMAL LOW (ref 101–111)
Creatinine, Ser: 0.48 mg/dL — ABNORMAL LOW (ref 0.61–1.24)
GFR calc Af Amer: >60 mL/min (ref >60)
GFR calc non Af Amer: >60 mL/min (ref >60)
Glucose, Bld: 199 mg/dL — ABNORMAL HIGH (ref 65–99)
POTASSIUM: 3.7 mmol/L (ref 3.5–5.1)
Sodium: 132 mmol/L — ABNORMAL LOW (ref 135–145)

## 2014-06-08 LAB — GLUCOSE, CAPILLARY
GLUCOSE-CAPILLARY: 164 mg/dL — AB (ref 65–99)
GLUCOSE-CAPILLARY: 220 mg/dL — AB (ref 65–99)
Glucose-Capillary: 225 mg/dL — ABNORMAL HIGH (ref 65–99)
Glucose-Capillary: 126 mg/dL — ABNORMAL HIGH (ref 65–99)
Glucose-Capillary: 197 mg/dL — ABNORMAL HIGH (ref 65–99)

## 2014-06-08 LAB — BLOOD GAS, ARTERIAL
ACID-BASE DEFICIT: 1.6 mmol/L (ref 0.0–2.0)
Bicarbonate: 21.7 mEq/L (ref 20.0–24.0)
Drawn by: 313061
O2 Content: 6 L/min
O2 SAT: 96.9 %
PH ART: 7.458 — AB (ref 7.350–7.450)
Patient temperature: 98.6
TCO2: 22.6 mmol/L (ref 0–100)
pCO2 arterial: 31.1 mmHg — ABNORMAL LOW (ref 35.0–45.0)
pO2, Arterial: 89.6 mmHg (ref 80.0–100.0)

## 2014-06-08 LAB — PROCALCITONIN: PROCALCITONIN: 1.02 ng/mL

## 2014-06-08 LAB — D-DIMER, QUANTITATIVE (NOT AT ARMC): D-Dimer, Quant: 0.43 ug/mL-FEU (ref 0.00–0.48)

## 2014-06-08 MED ORDER — IOHEXOL 350 MG/ML SOLN
100.0000 mL | Freq: Once | INTRAVENOUS | Status: AC | PRN
  Administered 2014-06-08: 75 mL via INTRAVENOUS

## 2014-06-08 MED ORDER — LEVOFLOXACIN IN D5W 500 MG/100ML IV SOLN
500.0000 mg | Freq: Every day | INTRAVENOUS | Status: DC
  Administered 2014-06-09 (×2): 500 mg via INTRAVENOUS
  Filled 2014-06-08 (×3): qty 100

## 2014-06-08 MED ORDER — BETHANECHOL CHLORIDE 10 MG PO TABS
10.0000 mg | ORAL_TABLET | Freq: Three times a day (TID) | ORAL | Status: DC
  Administered 2014-06-08 – 2014-06-18 (×30): 10 mg via ORAL
  Filled 2014-06-08 (×34): qty 1

## 2014-06-08 NOTE — Progress Notes (Signed)
*  PRELIMINARY RESULTS* Vascular Ultrasound Lower extremity venous duplex has been completed.  Preliminary findings: negative for DVT  Landry Mellow, RDMS, RVT  06/08/2014, 2:52 PM

## 2014-06-08 NOTE — Progress Notes (Signed)
Patient ID: Ray Garcia, male   DOB: [birth year], 54 y.o.   MRN: [id number] Neuro unchanged. o2 sat low. CCM   To help.

## 2014-06-08 NOTE — Progress Notes (Signed)
[facility] Progress Note Patient Name: Ray Garcia DOB: [birth year] MRN: [id number]   Date of Service  06/08/2014  HPI/Events of Note  CTangio - lingular pna No PE PCN allergic  eICU Interventions  Start levaquin ? Aspiration vs CAP     Intervention Category Intermediate Interventions: Diagnostic test evaluation  ALVA,RAKESH V. 06/08/2014, 11:53 PM

## 2014-06-08 NOTE — Consult Note (Signed)
Name: Ray Garcia MRN: [id number] DOB: Jan 02, 1961    ADMISSION DATE:  05/20/2014 CONSULTATION DATE:  5/15  REFERRING MD :  Joya Salm   CHIEF COMPLAINT:  Hypoxia   BRIEF PATIENT DESCRIPTION:  54 year old male w/ multiple medical co-morbids which include: Hep C, cirrhosis, substance abuse, Recently diagnosed w/ metastatic adenocarcinoma of lung w/ Mets to cerebellum. Now s/p  suboccipital crani on 5/10 for tumor resection. Nursing noted decreased O2 sats the early am of 5/15. O2 sats as low as 74% on room air. PCCM was called to assess new hypoxic respiratory failure.   SIGNIFICANT EVENTS    STUDIES:  MR brain 5/10: Partial resection of metastatic disease in the left cerebellar tonsil. The superior portion of the tumor remains. Tumor extends into the left posterior lateral medulla also unchanged.No other tumor deposits identified. Pneumocephalus  HISTORY OF PRESENT ILLNESS:   54 year old male w/ multiple medical co-morbids which include: Hep C, cirrhosis, substance abuse, Recently diagnosed w/ metastatic adenocarcinoma of lung w/ Mets to cerebellum. Now s/p stereotactic radiosurgery on 5/9 followed by suboccipital crani on 5/10 for tumor resection. F/U MRI still showed sig residual tumor, but had successful fossa decompression. He was pending possible transfer to in-patient rehab, to later be followed at cancer center, when he was noted to have decreased O2 sats the early am of 5/15. O2 sats as low as 74% on room air. ABG w/ PO2 89 mmHg on 6 liters O2. PCCM was called to assess new hypoxic respiratory failure.   PAST MEDICAL HISTORY :   has a past medical history of ETOH abuse; Dental caries; Cirrhosis of liver; Hepatitis; and Arthritis.  has past surgical history that includes Application of cranial navigation (N/A, 06/03/2014) and Craniectomy (06/03/2014). Prior to Admission medications   Medication Sig Start Date End Date Taking? Authorizing Provider  dexamethasone (DECADRON) 4 MG/ML  injection Inject 4 mg into the vein 3 (three) times daily.   Yes Historical Provider, MD  folic acid (FOLVITE) 1 MG tablet Take 1 mg by mouth daily.   Yes Historical Provider, MD  heparin 5000 UNIT/ML injection Inject 5,000 Units into the skin 3 (three) times daily.   Yes Historical Provider, MD  Multiple Vitamins-Minerals (MULTIVITAMIN WITH MINERALS) tablet Take 1 tablet by mouth daily.   Yes Historical Provider, MD  penicillin v potassium (VEETID) 500 MG tablet Take 500 mg by mouth 4 (four) times daily.   Yes Historical Provider, MD  thiamine 100 MG tablet Take 100 mg by mouth daily.   Yes Historical Provider, MD   Allergies  Allergen Reactions  . Amoxicillin Rash  . Ibuprofen Other (See Comments)    Pt stated ibuprofen made him pass out  . Penicillins Nausea And Vomiting    And a rash    FAMILY HISTORY:  family history is not on file. SOCIAL HISTORY:  reports that he quit smoking about 6 weeks ago. His smoking use included Cigarettes. He has quit using smokeless tobacco. He reports that he does not drink alcohol or use illicit drugs.  REVIEW OF SYSTEMS (bolds are positive):   Constitutional: Negative for fever, chills, weight loss, malaise/fatigue and diaphoresis.  HENT: Negative for hearing loss, ear pain, nosebleeds, congestion, sore throat, neck pain, tinnitus and ear discharge.   Eyes: Negative for blurred vision, double vision, photophobia, pain, discharge and redness.  Respiratory: + non-prod cough, hemoptysis, no sputum production, no shortness of breath, no wheezing and/or stridor.   Cardiovascular: Negative for chest pain, palpitations, orthopnea,  claudication, leg swelling and PND.  Gastrointestinal: Negative for heartburn, nausea, vomiting, abdominal pain, diarrhea, constipation, blood in stool and melena. + dysphagia  Genitourinary: Negative for dysuria, urgency, frequency, hematuria and flank pain.  Musculoskeletal: Negative for myalgias, back pain, joint pain and falls.    Skin: Negative for itching and rash.  Neurological: Negative for dizziness, tingling, tremors, sensory change, speech change, focal weakness, seizures, loss of consciousness, weakness and headaches.  Endo/Heme/Allergies: Negative for environmental allergies and polydipsia. Does not bruise/bleed easily.  VITAL SIGNS: Temp:  [97.6 F (36.4 C)-99.5 F (37.5 C)] 97.6 F (36.4 C) (05/15 0324) Pulse Rate:  [59-76] 70 (05/15 1110) Resp:  [15-27] 24 (05/15 1110) BP: (128-149)/(75-101) 149/89 mmHg (05/15 0600) SpO2:  [89 %-98 %] 92 % (05/15 1110)  PHYSICAL EXAMINATION: General:  54 year old male, currently in no acute distress. Lying in bed  Neuro:  Awake, oriented. [place]. Posterior crani dressing CD&I HEENT:  [place], AT  Cardiovascular:  rrr Lungs:  Decreased bases, no accessory muscle use  Abdomen:  Soft, non-tender + bowel sounds  Musculoskeletal:  Intact  Skin:  Intact    Recent Labs Lab 06/02/14 0630 06/03/14 0951 06/03/14 1225 06/04/14 0630  NA 131* 133* 133* 138  K 4.4 4.5 4.8 4.1  CL 96*  --   --  102  CO2 25  --   --  27  BUN 27*  --   --  24*  CREATININE 0.70  --   --  0.72  GLUCOSE 450* 279* 246* 166*    Recent Labs Lab 06/06/14 0440 06/07/14 0220 06/08/14 0225  HGB 13.0 14.1 13.8  HCT 38.0* 40.1 38.3*  WBC 9.9 9.4 11.9*  PLT 89* 131* 140*   No results found.  ASSESSMENT / PLAN:  Acute Hypoxic respiratory failure:  No cough. No sig chest pain. Has had limited mobility w/ dx of cancer. Think the order of possible d-dx to explain his hypoxia are as follows: atelectasis, possible VTE (DVT-->PE) as had bilateral Lower extremity discomfort noted on his posterior legs starting 5/14, vs aspiration/HCAP. He has not had fever spike. His cough has not changed, but does have documented/confirmed dysphagia and has not been compliant w/ aspiration/dysphagia precautions.  Plan Cont supplemental oxygen as needed to keep sats > 92% Get Bilateral LE [place] to eval LE pain and r/o  DVT Get CXR; if negative for significant atelectasis then we will need to consider CT angio.  Would need to discuss anticoagulation w/ n-surg. If VTE confirmed.  Ck PCT.  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 I have discussed the case with Jerrye Bushy, and I agree with the data and plans as amended above.  He has new onset hypoxemia, may be related to atelectasis + RUL mass, but high risk for PE. We will send him for CT-PA to better evaluate. If present, then we will need to decide whether he is safe to anticoagulate post-op.   Baltazar Apo, MD, PhD 06/08/2014, 3:22 PM [facility] [phone] or if no answer [phone]

## 2014-06-09 DIAGNOSIS — J9601 Acute respiratory failure with hypoxia: Secondary | ICD-10-CM

## 2014-06-09 DIAGNOSIS — J690 Pneumonitis due to inhalation of food and vomit: Secondary | ICD-10-CM

## 2014-06-09 DIAGNOSIS — B370 Candidal stomatitis: Secondary | ICD-10-CM

## 2014-06-09 DIAGNOSIS — G893 Neoplasm related pain (acute) (chronic): Secondary | ICD-10-CM

## 2014-06-09 LAB — CBC WITH DIFFERENTIAL/PLATELET
BASOS ABS: 0 10*3/uL (ref 0.0–0.1)
Basophils Relative: 0 % (ref 0–1)
Eosinophils Absolute: 0 10*3/uL (ref 0.0–0.7)
Eosinophils Relative: 0 % (ref 0–5)
HCT: 38.9 % — ABNORMAL LOW (ref 39.0–52.0)
Hemoglobin: 14.2 g/dL (ref 13.0–17.0)
Lymphocytes Relative: 7 % — ABNORMAL LOW (ref 12–46)
Lymphs Abs: 1.1 10*3/uL (ref 0.7–4.0)
MCH: 32.3 pg (ref 26.0–34.0)
MCHC: 36.5 g/dL — AB (ref 30.0–36.0)
MCV: 88.4 fL (ref 78.0–100.0)
Monocytes Absolute: 1 10*3/uL (ref 0.1–1.0)
Monocytes Relative: 7 % (ref 3–12)
Neutro Abs: 12.2 10*3/uL — ABNORMAL HIGH (ref 1.7–7.7)
Neutrophils Relative %: 86 % — ABNORMAL HIGH (ref 43–77)
PLATELETS: 147 10*3/uL — AB (ref 150–400)
RBC: 4.4 MIL/uL (ref 4.22–5.81)
RDW: 12.7 % (ref 11.5–15.5)
WBC: 14.2 10*3/uL — ABNORMAL HIGH (ref 4.0–10.5)

## 2014-06-09 LAB — PROCALCITONIN
PROCALCITONIN: 1.34 ng/mL
Procalcitonin: 1.14 ng/mL

## 2014-06-09 LAB — GLUCOSE, CAPILLARY
GLUCOSE-CAPILLARY: 200 mg/dL — AB (ref 65–99)
Glucose-Capillary: 136 mg/dL — ABNORMAL HIGH (ref 65–99)
Glucose-Capillary: 164 mg/dL — ABNORMAL HIGH (ref 65–99)
Glucose-Capillary: 164 mg/dL — ABNORMAL HIGH (ref 65–99)
Glucose-Capillary: 184 mg/dL — ABNORMAL HIGH (ref 65–99)
Glucose-Capillary: 227 mg/dL — ABNORMAL HIGH (ref 65–99)

## 2014-06-09 MED ORDER — DIPHENHYDRAMINE HCL 50 MG/ML IJ SOLN
12.5000 mg | Freq: Four times a day (QID) | INTRAMUSCULAR | Status: DC | PRN
  Administered 2014-06-14: 12.5 mg via INTRAVENOUS
  Filled 2014-06-09: qty 1

## 2014-06-09 MED ORDER — SODIUM CHLORIDE 0.9 % IJ SOLN: 9.0000 mL | INTRAMUSCULAR | Status: DC | PRN

## 2014-06-09 MED ORDER — HYDROMORPHONE HCL PF 10 MG/ML IJ SOLN: 3.0000 mg/h | INTRAMUSCULAR | Status: DC

## 2014-06-09 MED ORDER — HYDROMORPHONE HCL 1 MG/ML IJ SOLN
1.0000 mg | INTRAMUSCULAR | Status: DC | PRN
  Administered 2014-06-09: 2 mg via INTRAVENOUS
  Filled 2014-06-09 (×2): qty 2

## 2014-06-09 MED ORDER — HYDROMORPHONE 0.3 MG/ML IV SOLN
INTRAVENOUS | Status: DC
Start: 2014-06-09 — End: 2014-06-09
  Administered 2014-06-09: 11:00:00 via INTRAVENOUS
  Filled 2014-06-09: qty 25

## 2014-06-09 MED ORDER — GABAPENTIN 100 MG PO CAPS
200.0000 mg | ORAL_CAPSULE | Freq: Three times a day (TID) | ORAL | Status: DC
  Administered 2014-06-09 – 2014-06-13 (×12): 200 mg via ORAL
  Filled 2014-06-09 (×14): qty 2

## 2014-06-09 MED ORDER — DIPHENHYDRAMINE HCL 12.5 MG/5ML PO ELIX
12.5000 mg | ORAL_SOLUTION | Freq: Four times a day (QID) | ORAL | Status: DC | PRN
  Filled 2014-06-09: qty 5

## 2014-06-09 MED ORDER — DEXTROSE-NACL 5-0.45 % IV SOLN
INTRAVENOUS | Status: DC
  Administered 2014-06-09 – 2014-06-11 (×2): via INTRAVENOUS

## 2014-06-09 MED ORDER — NALOXONE HCL 0.4 MG/ML IJ SOLN: 0.4000 mg | INTRAMUSCULAR | Status: DC | PRN

## 2014-06-09 MED ORDER — HYDROMORPHONE HCL 1 MG/ML IJ SOLN
2.0000 mg | INTRAMUSCULAR | Status: DC | PRN
  Administered 2014-06-09 (×3): 2 mg via INTRAVENOUS
  Filled 2014-06-09 (×2): qty 2

## 2014-06-09 MED ORDER — SODIUM CHLORIDE 0.9 % IV SOLN
0.5000 mg/h | INTRAVENOUS | Status: DC
  Administered 2014-06-09: 3 mg/h via INTRAVENOUS
  Filled 2014-06-09: qty 20

## 2014-06-09 MED ORDER — ONDANSETRON HCL 4 MG/2ML IJ SOLN: 4.0000 mg | Freq: Four times a day (QID) | INTRAMUSCULAR | Status: DC | PRN

## 2014-06-09 NOTE — Consult Note (Addendum)
ASked to see Ray Garcia  by Dr. Kathyrn Sheriff for palliative consult for goals of care and pain management  Impression: 1. 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 May also have bone mets. 2. Severe pain related to the above  Recommendations: 1. Rapid titration of IV dilaudid for pain control and to determine dosing for dilaudid infusion. 2. Addition of gabapentin 200 mg tid for nerve related pain from Pancoast's tumor 3. Continue steroids 4. Continue miralax 5. Patient to consider code status: full code, including intubation, at this time.  HPI Ray Garcia is a 53 y.o. year old male with significant past medical history of cirrhosis, hepatitis C, HTN, dental caries/abscess, ETOH abuse presenting with cerebellar mass. Patient presented today at [facility] with chief complaint of recurrent intermittent headaches as well as recurrent nausea and dizziness. He was found to have a cerebellar mas that was/is metastatic adenocarcinoma from a lung primary. He underwent craniotomy with resection of tumor at the left cerebellar tonsil with remaining tumor superior to the tonsil, with extension to left posterior lateral medulla. He was found to have a 7.7 cm mass RUL that is invasive adenocarcinoma by needle biopsy.  Ray Garcia, after consultation with neurosurgery, oncology and radiation oncology received 16 Gy pre-op SRT followed in a delayed fashion with micro-surgical resection cerebellar aspect of tumor 5/10. He had been making a good recovery from surgery.  He has developed swallow difficulty and full evaluation reveals oropharyngeal dysphagia due to paralysis of left arytenoid and reduced airway closure during swallow with penetration of all liquid consistencies. Puree diet and pudding thick liquids recommended.   Ray Garcia had an episode of hypoxemia with eval c/w aspiration pnemonitis LLL pna. NO evidence of PE.  Palliative medicine is called to assist with pain  management: on Dilaudid PCA with 1.'5mg'$ /hr lockout and he remains very uncomfortable. There is also a need to address code status and goals of care.   Scheduled Meds:  antiseptic oral rinse  7 mL Mouth Rinse BID   bethanechol  10 mg Oral TID   dexamethasone  4 mg Intravenous 3 times per day   docusate sodium  100 mg Oral BID   feeding supplement (ENSURE ENLIVE)  237 mL Oral BID BM   fluconazole  200 mg Oral Daily   folic acid  1 mg Oral Daily   HYDROmorphone PCA 0.3 mg/mL   Intravenous 6 times per day   insulin aspart  0-15 Units Subcutaneous 6 times per day   insulin glargine  10 Units Subcutaneous Daily   levofloxacin (LEVAQUIN) IV  500 mg Intravenous QHS   multivitamin with minerals  1 tablet Oral Daily   oxymetazoline  2 spray Right Nare Once   polyethylene glycol  17 g Oral Daily   saccharomyces boulardii  250 mg Oral BID   thiamine  100 mg Oral Daily   Continuous Infusions:  dextrose 5 % and 0.45% NaCl 50 mL/hr at 06/09/14 1058   PRN Meds:.bisacodyl, diphenhydrAMINE **OR** diphenhydrAMINE, hydrALAZINE, HYDROmorphone (DILAUDID) injection, labetalol, LORazepam, meclizine, naloxone **AND** sodium chloride, ondansetron (ZOFRAN) IV, promethazine, promethazine   Past Medical History  Diagnosis Date   ETOH abuse    Dental caries    Cirrhosis of liver    Hepatitis     c   Arthritis     History   Social History   Marital Status: Married    Spouse Name: N/A   Number of Children: 3   Years of Education: HSG  Social History Main Topics   Smoking status: Former Smoker    Types: Cigarettes    Quit date: 04/24/2014   Smokeless tobacco: Former Systems developer   Alcohol Use: No     Comment: quit drinking  04/11/2014   Drug Use: No     Comment: quit all street drugs 6 years ago  04-10-2008   Sexual Activity: Not on file   Other Topics Concern   None   Social History Narrative  Ray Garcia has a remote h/o alcohol and IVDU. The later was probable source for HCV.  He has cirrhosis as well. He had 3 children prior to his current marriage: 1 son was a veteran who was shot and killed in an altercation post-discharge at age 69; 1 son was killed in a MVA. He has a daughter in [place] from whom he is estranged. 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 He feels that when working he did have exposure to hazardous materials. His mother died in April 10, 2013, an LPN, who was a hospice patient. He reports a close friend died after a long and punishing illness.   PE:  Filed Vitals:   06/09/14 1200  BP: 151/96  Pulse: 65  Temp:   Resp: 28   General: ill appearing white man who is in a lot of pain HEENT-skull not examined. C&S clear Cor-RRR Pul-normal respirations Neuro- awake, alert, speech clear, MAE   CBC Latest Ref Rng 06/09/2014 06/08/2014 06/07/2014  WBC 4.0 - 10.5 K/uL 14.2(H) 11.9(H) 9.4  Hemoglobin 13.0 - 17.0 g/dL 14.2 13.8 14.1  Hematocrit 39.0 - 52.0 % 38.9(L) 38.3(L) 40.1  Platelets 150 - 400 K/uL 147(L) 140(L) 131(L)    CMP Latest Ref Rng 06/08/2014 06/04/2014 06/03/2014  Glucose 65 - 99 mg/dL 199(H) 166(H) 246(H)  BUN 6 - 20 mg/dL 20 24(H) -  Creatinine 0.61 - 1.24 mg/dL 0.48(L) 0.72 -  Sodium 135 - 145 mmol/L 132(L) 138 133(L)  Potassium 3.5 - 5.1 mmol/L 3.7 4.1 4.8  Chloride 101 - 111 mmol/L 98(L) 102 -  CO2 22 - 32 mmol/L 21(L) 27 -  Calcium 8.9 - 10.3 mg/dL 8.9 8.7(L) -  Total Protein 6.5 - 8.1 g/dL - - -  Total Bilirubin 0.3 - 1.2 mg/dL - - -  Alkaline Phos 38 - 126 U/L - - -  AST 15 - 41 U/L - - -  ALT 17 - 63 U/L - - -   Procalcitonins: 1.02, 1.34, 1.14  Imaging: reports reviewed.   Path: needle aspirate lung mass and tissue from brain tumor resection = adenocarcinoma.   Long discussion with patient and his wife. He has good insight to his disease and realizes that he has a terminal diagnosis. Discussed the important of knowing the "bang for the buck" in regard to chemotherapy. He openly states the  prefers a short and painless death to a prolonged and suffering death. Discussed cardiac resuscitation and intubation. At this time he needs more time to think about these issues.  His worst pain is in the right shoulder and scapula. He also has intermittent pain in the spine and ribs. He likely has a pancoast's type umor with associated pain and likely has bone mets.  Time in  1327 Time out  1519  Greater than 50% time of encounter on education and counseling.  Adella Hare, MD Palliative Medicine Team [phone]

## 2014-06-09 NOTE — Progress Notes (Signed)
Speech Language Pathology Treatment: Dysphagia  Patient Details Name: Ray Garcia MRN: [id number] DOB: [birth year] Today's Date: 06/09/2014 Time: 9379-0240 SLP Time Calculation (min) (ACUTE ONLY): 16 min  Assessment / Plan / Recommendation Clinical Impression  Pt has difficulty sitting in an upright posture secondary to pain, therefore PO trials were administered in a partially reclined position, likely how he is consuming his meals as well. Pt had one instance of delayed throat clearing with oral expectoration of puree, which he suctioned from his mouth with Mod I. He says that this had not been happening during his meals throughout the weekend. Today he denies odynophagia and believes his voice sounds better, although subjectively it remains very hoarse and breathy. Discussed repeat testing with patient, and he is agreeable but wants to rest today. Question if pt will be able to tolerate MBS given inability to sit fully upright, although palliative care is now on board to assist with management of this. Will f/u to determine most appropriate means of testing.   HPI Other Pertinent Information: This is a 54 y.o. year old male with PMH of cirrhosis, hepatitis C, HTN, dental caries/abscess, ETOH abuse, presenting with cerebellar mass. Pt underwent biopsy and is s/p craniectomy for partial resection of tumor. Follow up MRI 5/10 shows that the superior portion of the tumor remains, with extension into the left posterior lateral medulla unchanges. Swallow evaluation requested after RN noted difficulty swallowing at bedside, including coughing and expectoration of eggs from breakfast meal.   Pertinent Vitals Pain Assessment: 0-10 Pain Score: 7  Faces Pain Scale: Hurts whole lot Pain Location: back Pain Descriptors / Indicators: Aching;Throbbing Pain Intervention(s): RN gave pain meds during session  SLP Plan  Continue with current plan of care    Recommendations Diet recommendations: Dysphagia 1  (puree);Pudding-thick liquid Liquids provided via: Teaspoon Medication Administration: Crushed with puree Supervision: Patient able to self feed;Full supervision/cueing for compensatory strategies Compensations: Slow rate;Small sips/bites Postural Changes and/or Swallow Maneuvers: Seated upright 90 degrees;Upright 30-60 min after meal       Oral Care Recommendations: Oral care BID Follow up Recommendations: Inpatient Rehab;24 hour supervision/assistance Plan: Continue with current plan of care    Germain Osgood, M.A. CCC-SLP [phone]  Germain Osgood 06/09/2014, 4:20 PM

## 2014-06-09 NOTE — Progress Notes (Signed)
Occupational Therapy Treatment Patient Details Name: Ray Garcia MRN: [id number] DOB: Jul 28, 1960 Today's Date: 06/09/2014    History of present illness This is a 54 y.o. year old male with significant past medical history of cirrhosis, hepatitis C, HTN, dental caries/abscess, ETOH abuse presenting with cerebellar mass. Pt underwent biopsy and has now been to sx for excision of good portion of the tumor.   OT comments  Pt in great amount of pain today limiting therapy progress.  Spoke with nursing about pain control.  Palliative Care has been consulted to help manage pain.  Feel there also may be a large anxiety component to pt resisting movement.  Question if this could also be addressed.  Pt's cognitive status much improved today.  Will continue with originally set goals.  Follow Up Recommendations  CIR;Supervision/Assistance - 24 hour    Equipment Recommendations  3 in 1 bedside comode;Tub/shower bench    Recommendations for Other Services      Precautions / Restrictions Precautions Precautions: Fall Restrictions Weight Bearing Restrictions: No       Mobility Bed Mobility Overal bed mobility: Needs Assistance Bed Mobility: Sidelying to Sit;Sit to Sidelying Rolling: Mod assist Sidelying to sit: Min assist   Sit to supine: Min assist Sit to sidelying: Mod assist;+2 for safety/equipment General bed mobility comments: Pt needed a lot of encouragement to attempt therapy today.  Time limits were set before treatment started so pt knew when it would be over.  As soon as pt got to EOB he refused further treatment due to pain.  Even with further explanation of importance of mobilization, pt continued to refuse.  Transfers                 General transfer comment: Pt refused standing.    Balance Overall balance assessment: Needs assistance Sitting-balance support: Feet supported Sitting balance-Leahy Scale: Poor Sitting balance - Comments: Pt could not sit long due to  pain so evaluating balance was limited.                           ADL Overall ADL's : Needs assistance/impaired Eating/Feeding: Minimal assistance;Bed level (lays sideways in bed to feed self with set up)   Grooming: Minimal assistance;Bed level Grooming Details (indicate cue type and reason): limited by positioning and pain.  Pt is only comfortable on his L side.  Pt can brush teeth, comb hair etc but is only limited by the position he is in. Upper Body Bathing: Minimal assitance;Sitting;Bed level                           Functional mobility during ADLs: +2 for safety/equipment;Minimal assistance General ADL Comments: Pt limited most by pain and anxiety.      Vision                     Perception     Praxis      Cognition   Behavior During Therapy: Anxious Overall Cognitive Status: Within Functional Limits for tasks assessed     Current Attention Level: Alternating          Problem Solving: Slow processing General Comments: Pain remains the limited factor.  Pt is very anxious about moving and hyperfocused on pain.  Question if MD could possibly look at treatment for anxiety?  pt respirations went up talking about getting up.  Unable to progress through therapy as needed with this  back pain and pt needs to be mobilized with new PNA.    Extremity/Trunk Assessment               Exercises     Shoulder Instructions       General Comments      Pertinent Vitals/ Pain       Pain Assessment: 0-10 Pain Score: 10-Worst pain ever Faces Pain Scale: Hurts whole lot Pain Location: back pain Pain Descriptors / Indicators: Aching;Throbbing Pain Intervention(s): Limited activity within patient's tolerance;Monitored during session;Premedicated before session;Repositioned;Patient requesting pain meds-RN notified;RN gave pain meds during session;PCA encouraged  Home Living                                          Prior  Functioning/Environment              Frequency Min 3X/week     Progress Toward Goals  OT Goals(current goals can now be found in the care plan section)  Progress towards OT goals: Not progressing toward goals - comment (pain limiting progress today.)  Acute Rehab OT Goals Patient Stated Goal: to get better OT Goal Formulation: With patient/family Time For Goal Achievement: 06/20/14 Potential to Achieve Goals: Good ADL Goals Pt Will Perform Grooming: with supervision;sitting Pt Will Perform Upper Body Bathing: with supervision;sitting Pt Will Perform Upper Body Dressing: with supervision;sitting Pt Will Transfer to Toilet: with min assist;bedside commode;ambulating Pt Will Perform Toileting - Clothing Manipulation and hygiene: with min assist;sit to/from stand Additional ADL Goal #1: Pt will demonstrate emergent awreness of complesatory techniques for trunkal ataxia during ADL with min vc  Plan Discharge plan remains appropriate    Co-evaluation    PT/OT/SLP Co-Evaluation/Treatment: Yes Reason for Co-Treatment: Complexity of the patient's impairments (multi-system involvement)   OT goals addressed during session: ADL's and self-care      End of Session     Activity Tolerance Patient limited by pain   Patient Left in bed;with call bell/phone within reach   Nurse Communication Mobility status        Time: 5498-2641 OT Time Calculation (min): 23 min  Charges: OT General Charges $OT Visit: 1 Procedure OT Treatments $Therapeutic Activity: 8-22 mins  Glenford Peers 06/09/2014, 1:14 PM  [phone]

## 2014-06-09 NOTE — Progress Notes (Signed)
Rehab admissions - I am following pt's case and briefly met with pt this am. I noted new CT results showing lingular PNA. He is not medically ready for CIR at this time.  I will continue to follow his case and we will consider possible inpatient rehab admit pending his medical clearance and our bed availability. I called and updated pt's wife that we will check on his status tomorrow.  Please call me with any questions. Thanks.  Nanetta Batty, PT Rehabilitation Admissions Coordinator [phone]

## 2014-06-09 NOTE — Consult Note (Signed)
Name: Ray Garcia MRN: [id number] DOB: 1960-10-25    ADMISSION DATE:  05/20/2014 CONSULTATION DATE:  5/15  REFERRING MD :  Joya Salm   CHIEF COMPLAINT:  Hypoxia   BRIEF PATIENT DESCRIPTION:  54 year old male w/ multiple medical co-morbids which include: Hep C, cirrhosis, substance abuse, Recently diagnosed w/ metastatic adenocarcinoma of lung - RUL 05/23/14 large > 5cm A w/ Mets to cerebellum. Now s/p  suboccipital crani on 5/10 for tumor resection. Nursing noted decreased O2 sats the early am of 5/15. O2 sats as low as 74% on room air. PCCM was called to assess new hypoxic respiratory failure 06/08/14.   SIGNIFICANT EVENTS  MR brain 5/10: Partial resection of metastatic disease in the left cerebellar tonsil. The superior portion of the tumor remains. Tumor extends into the left posterior lateral medulla also unchanged.No other tumor deposits identified. Pneumocephalus    SUBJECTIVE/OVERNIGHT/INTERVAL HX 06/09/2014: c.o pain that is new x 5 days and on right upper chest in back, per RN. To me he says that pain is in back generalized. X 5 days, Progressive, Needing opioids for relief. Is moderate to severe. Has had associated urinary retention and constipation x 2-3 days but no incontinence. No associated dyspnea or cough. Unclear aggravating factors   Denies resp distress. He is on 3L. S/ P FEES 06/06/14: p-er RN - one vocal cord paralyzed. Thrush +. Aspirating +. Pureed diet +. On dlfucan VITAL SIGNS: Temp:  [97.3 F (36.3 C)-98.6 F (37 C)] 97.3 F (36.3 C) (05/16 0400) Pulse Rate:  [59-80] 69 (05/16 0800) Resp:  [16-29] 18 (05/15 2000) BP: (127-161)/(80-94) 143/88 mmHg (05/16 0800) SpO2:  [86 %-98 %] 92 % (05/16 0800)  PHYSICAL EXAMINATION: General:  54 year old male, currently in no acute distress. Lying in bed  Neuro:  Awake, oriented. [place]. Posterior crani dressing CD&I. Moves limbs HEENT:  , AT  Cardiovascular:  rrr Lungs:  Decreased bases, no accessory muscle use  Abdomen:   Soft, non-tender + bowel sounds  Musculoskeletal:  Intact  Skin:  Intact  GU: foley +   PULMONARY  Recent Labs Lab 06/08/14 1045  PHART 7.458*  PCO2ART 31.1*  PO2ART 89.6  HCO3 21.7  TCO2 22.6  O2SAT 96.9    CBC  Recent Labs Lab 06/07/14 0220 06/08/14 0225 06/09/14 0205  HGB 14.1 13.8 14.2  HCT 40.1 38.3* 38.9*  WBC 9.4 11.9* 14.2*  PLT 131* 140* 147*    COAGULATION No results for input(s): INR in the last 168 hours.  CARDIAC  No results for input(s): TROPONINI in the last 168 hours. No results for input(s): PROBNP in the last 168 hours.   CHEMISTRY  Recent Labs Lab 06/03/14 0951 06/03/14 1225 06/04/14 0630 06/08/14 1300  NA 133* 133* 138 132*  K 4.5 4.8 4.1 3.7  CL  --   --  102 98*  CO2  --   --  27 21*  GLUCOSE 279* 246* 166* 199*  BUN  --   --  24* 20  CREATININE  --   --  0.72 0.48*  CALCIUM  --   --  8.7* 8.9   Estimated Creatinine Clearance: 118.6 mL/min (by C-G formula based on Cr of 0.48).   LIVER No results for input(s): AST, ALT, ALKPHOS, BILITOT, PROT, ALBUMIN, INR in the last 168 hours.   INFECTIOUS  Recent Labs Lab 06/08/14 1300 06/09/14 0205  PROCALCITON 1.02 1.34     ENDOCRINE CBG (last 3)   Recent Labs  06/09/14 0022  06/09/14 0358 06/09/14 0806  GLUCAP 164* 136* 200*         IMAGING x48h  - one or  more images have been personally visualized and highlighted Ct Angio Chest Pe W/cm &/or Wo Cm  06/08/2014   CLINICAL DATA:  New hypoxic respiratory failure. Shortness of breath. Recent diagnosis of adenocarcinoma of the lung with metastatic disease to the cerebellum. Cirrhosis.  EXAM: CT ANGIOGRAPHY CHEST WITH CONTRAST  TECHNIQUE: Multidetector CT imaging of the chest was performed using the standard protocol during bolus administration of intravenous contrast. Multiplanar CT image reconstructions and MIPs were obtained to evaluate the vascular anatomy.  CONTRAST:  78m OMNIPAQUE IOHEXOL 350 MG/ML SOLN  COMPARISON:   05/21/2014  FINDINGS: Lungs are adequately inflated and demonstrate no change in patient's known right upper lobe lung cancer measuring 6.3 x 7.7 cm in AP and transverse dimension. There is a new patchy nodular airspace process over the left lung worse in the lower lobe and lingula likely due to a pneumonia. No significant effusion.  Mild prominence of the heart. Mild calcified plaque over the left anterior descending coronary artery. There is no evidence of pulmonary embolism. 1 cm left hilar lymph node likely reactive. Several sub cm right hilar and mediastinal lymph nodes unchanged to slightly more prominent.  Images through the upper abdomen demonstrate no change in mild splenomegaly. No change in several small lymph nodes in the region of the celiac axis/porta hepatis. Remaining bones soft tissues are unchanged.  Review of the MIP images confirms the above findings.  IMPRESSION: New nodular airspace process within the left lung most prominent over the lingula and lower lobe likely a pneumonia. No evidence of pulmonary embolism.  Known right upper lobe lung cancer unchanged measuring 6.3 x 7.7 cm.  Atherosclerotic coronary artery disease. Mild prominence of the heart.  Stable splenomegaly.  Few small stable lymph nodes in the region of the celiac axis/porta hepatis.   Electronically Signed   By: DMarin OlpM.D.   On: 06/08/2014 16:27   Dg Chest Port 1 View  06/08/2014   CLINICAL DATA:  Low oxygen saturation  EXAM: PORTABLE CHEST - 1 VIEW  COMPARISON:  06/03/2014  FINDINGS: Stable right apical lung mass. Central venous catheter has been removed. Patchy airspace opacities have developed at the left base. Right lung is clear.  IMPRESSION: New airspace disease at the left base.   Electronically Signed   By: AMarybelle KillingsM.D.   On: 06/08/2014 13:38    [place]- LE - negative for DVT    ASSESSMENT / PLAN:  Acute Hypoxic respiratory failure: due  aspiration pneumonitis LLL pneumonia - vocal cord paralysis +,  thrush+, aspirated on FEEDS 06/06/14  - needing 3L [place] and currently severity is mild - not toxic on examm - [place]15[place]16 - neg for DVt (prelim)   -Plan Cont supplemental oxygen as needed to keep sats > 92% Ck PCT to limit abx Rx Cont aspiration precautions   #Pharyngeal thrush noted on FEEDS 06/06/14  - diflucan - await repeat eval for clearance by speech  #Dysphagia - diet per speech- might improve if thrush resolves but do not expect full improvement due to VC paralysis  #Pain - likely malignancy relatd  Unclear if RUL mass related or back pain No evidence of neuro deficit Poorly controlled  PLAn Consolidate opioid Rx to dilaudid - increase dosse START PCA instead   #S/p posterior crani for decompression of cerebellar lung mass Adenocarcinoma of lung Plan Per  n-surg and heme/onc  #Hypertension  -cotninue bp meds   #GLOBAL  - call palliative care for pain mgmt, palliation, goals of care, code status, advanced care planning - decaddron    Dr. Brand Males, M.D., [facility].C.P Pulmonary and Critical Care Medicine Staff Physician [facility] Pager: [phone], If no answer or between  15:00h - 7:00h: call [phone]  06/09/2014 9:29 AM

## 2014-06-09 NOTE — Progress Notes (Signed)
Physical Therapy Treatment Patient Details Name: Ray Garcia MRN: [id number] DOB: 24-Sep-1960 Today's Date: 06/09/2014    History of Present Illness This is a 54 y.o. year old male with significant past medical history of cirrhosis, hepatitis C, HTN, dental caries/abscess, ETOH abuse presenting with cerebellar mass. Pt underwent biopsy and has now been to sx for excision of good portion of the tumor.    PT Comments    Pain limiting attempts to mobilize.  Pt can localize the pain in his back and gets anxious the moment the pain occurs and therapies asks him to do more through the pain.  Follow Up Recommendations  CIR     Equipment Recommendations  Rolling walker with 5" wheels    Recommendations for Other Services       Precautions / Restrictions Precautions Precautions: Fall    Mobility  Bed Mobility Overal bed mobility: Needs Assistance Bed Mobility: Rolling;Sidelying to Sit;Sit to Sidelying Rolling: Mod assist Sidelying to sit: Mod assist;+2 for physical assistance     Sit to sidelying: Mod assist;+2 for safety/equipment General bed mobility comments: pt needed maximum encouragement to try getting to EOB  Transfers                 General transfer comment: pt refused further intervention due to increase in pain  Ambulation/Gait         Gait velocity: slower       Stairs            Wheelchair Mobility    Modified Rankin (Stroke Patients Only)       Balance     Sitting balance-Leahy Scale: Poor Sitting balance - Comments: bil UE supporting at EOB for the most part due to pain.                            Cognition Arousal/Alertness: Awake/alert Behavior During Therapy: Anxious Overall Cognitive Status: Within Functional Limits for tasks assessed               Problem Solving: Slow processing General Comments: Pain is the limiting factor.  At times all consuming and making pt anxious and panicky    Exercises      General Comments General comments (skin integrity, edema, etc.): HR jumping up into the 120's in anticipation of moving to EOB      Pertinent Vitals/Pain Pain Assessment: Faces Faces Pain Scale: Hurts whole lot Pain Location: back at L flank Pain Descriptors / Indicators: Guarding;Grimacing Pain Intervention(s): Monitored during session;Repositioned    Home Living                      Prior Function            PT Goals (current goals can now be found in the care plan section) Acute Rehab PT Goals Patient Stated Goal: to get better PT Goal Formulation: With patient Time For Goal Achievement: 06/19/14 Potential to Achieve Goals: Good Progress towards PT goals: Not progressing toward goals - comment (due to severe pain with sitting/moving)    Frequency  Min 3X/week    PT Plan Current plan remains appropriate    Co-evaluation PT/OT/SLP Co-Evaluation/Treatment: Yes Reason for Co-Treatment: Complexity of the patient's impairments (multi-system involvement)         End of Session   Activity Tolerance: Patient limited by pain Patient left: in bed;with call bell/phone within reach     Time: 2376-2831 PT Time Calculation (  min) (ACUTE ONLY): 23 min  Charges:  $Therapeutic Activity: 8-22 mins                    G Codes:      Lexiana Spindel, Tessie Fass 06/09/2014, 1:07 PM 06/09/2014  Donnella Sham, PT [phone]  (pager)

## 2014-06-09 NOTE — Progress Notes (Signed)
UR completed.  CIR is following pt.  Sandi Mariscal, RN BSN [place] CCM Trauma/Neuro ICU Case Manager [phone]

## 2014-06-09 NOTE — Progress Notes (Signed)
Patient required 8 mg IV dilaudid over 2 hr. Will start infusion of 3 mg/hr with continued breakthrough dosing.

## 2014-06-10 ENCOUNTER — Inpatient Hospital Stay (HOSPITAL_COMMUNITY): Payer: Medicaid Other

## 2014-06-10 LAB — BASIC METABOLIC PANEL
Anion gap: 11 (ref 5–15)
BUN: 20 mg/dL (ref 6–20)
CALCIUM: 9 mg/dL (ref 8.9–10.3)
CO2: 24 mmol/L (ref 22–32)
Chloride: 96 mmol/L — ABNORMAL LOW (ref 101–111)
Creatinine, Ser: 0.46 mg/dL — ABNORMAL LOW (ref 0.61–1.24)
GLUCOSE: 135 mg/dL — AB (ref 65–99)
Potassium: 4.7 mmol/L (ref 3.5–5.1)
Sodium: 131 mmol/L — ABNORMAL LOW (ref 135–145)

## 2014-06-10 LAB — CBC WITH DIFFERENTIAL/PLATELET
Basophils Absolute: 0 10*3/uL (ref 0.0–0.1)
Basophils Relative: 0 % (ref 0–1)
EOS PCT: 0 % (ref 0–5)
Eosinophils Absolute: 0 10*3/uL (ref 0.0–0.7)
HEMATOCRIT: 40.1 % (ref 39.0–52.0)
HEMOGLOBIN: 14.5 g/dL (ref 13.0–17.0)
LYMPHS PCT: 10 % — AB (ref 12–46)
Lymphs Abs: 1.6 10*3/uL (ref 0.7–4.0)
MCH: 31.9 pg (ref 26.0–34.0)
MCHC: 36.2 g/dL — ABNORMAL HIGH (ref 30.0–36.0)
MCV: 88.3 fL (ref 78.0–100.0)
MONO ABS: 1.1 10*3/uL — AB (ref 0.1–1.0)
Monocytes Relative: 7 % (ref 3–12)
Neutro Abs: 12.9 10*3/uL — ABNORMAL HIGH (ref 1.7–7.7)
Neutrophils Relative %: 83 % — ABNORMAL HIGH (ref 43–77)
PLATELETS: 177 10*3/uL (ref 150–400)
RBC: 4.54 MIL/uL (ref 4.22–5.81)
RDW: 12.8 % (ref 11.5–15.5)
WBC: 15.6 10*3/uL — ABNORMAL HIGH (ref 4.0–10.5)

## 2014-06-10 LAB — PROCALCITONIN: PROCALCITONIN: 1.41 ng/mL

## 2014-06-10 LAB — GLUCOSE, CAPILLARY
GLUCOSE-CAPILLARY: 143 mg/dL — AB (ref 65–99)
Glucose-Capillary: 154 mg/dL — ABNORMAL HIGH (ref 65–99)
Glucose-Capillary: 156 mg/dL — ABNORMAL HIGH (ref 65–99)
Glucose-Capillary: 325 mg/dL — ABNORMAL HIGH (ref 65–99)
Glucose-Capillary: 161 mg/dL — ABNORMAL HIGH (ref 65–99)
Glucose-Capillary: 164 mg/dL — ABNORMAL HIGH (ref 65–99)

## 2014-06-10 LAB — PHOSPHORUS: Phosphorus: 3.9 mg/dL (ref 2.5–4.6)

## 2014-06-10 LAB — MAGNESIUM: Magnesium: 2.1 mg/dL (ref 1.7–2.4)

## 2014-06-10 MED ORDER — LEVOFLOXACIN 500 MG PO TABS
500.0000 mg | ORAL_TABLET | Freq: Every day | ORAL | Status: AC
  Administered 2014-06-10 – 2014-06-14 (×5): 500 mg via ORAL
  Filled 2014-06-10 (×5): qty 1

## 2014-06-10 MED ORDER — INSULIN GLARGINE 100 UNIT/ML ~~LOC~~ SOLN
15.0000 [IU] | Freq: Every day | SUBCUTANEOUS | Status: DC
  Administered 2014-06-11 – 2014-06-16 (×6): 15 [IU] via SUBCUTANEOUS
  Filled 2014-06-10 (×6): qty 0.15

## 2014-06-10 MED ORDER — SODIUM CHLORIDE 0.9 % IV SOLN
1.0000 mg/h | INTRAVENOUS | Status: AC
  Filled 2014-06-10: qty 20

## 2014-06-10 MED ORDER — SODIUM CHLORIDE 0.9 % IV SOLN
1.0000 mg/h | INTRAVENOUS | Status: DC
  Administered 2014-06-10 – 2014-06-12 (×3): 1 mg/h via INTRAVENOUS
  Filled 2014-06-10 (×2): qty 5

## 2014-06-10 MED ORDER — SODIUM CHLORIDE 0.9 % IV SOLN
0.5000 mg/h | INTRAVENOUS | Status: DC
  Filled 2014-06-10: qty 5

## 2014-06-10 MED ORDER — HYDROMORPHONE HCL 2 MG PO TABS
6.0000 mg | ORAL_TABLET | ORAL | Status: DC
  Administered 2014-06-10 – 2014-06-13 (×17): 6 mg via ORAL
  Filled 2014-06-10 (×17): qty 3

## 2014-06-10 MED ORDER — HYDROMORPHONE HCL 1 MG/ML PO LIQD: 6.0000 mg | ORAL | Status: DC

## 2014-06-10 MED ORDER — HYDROMORPHONE BOLUS VIA INFUSION
1.0000 mg | INTRAVENOUS | Status: DC | PRN
  Administered 2014-06-10 – 2014-06-13 (×15): 1 mg via INTRAVENOUS
  Filled 2014-06-10 (×16): qty 1

## 2014-06-10 MED ORDER — LEVOFLOXACIN 25 MG/ML PO SOLN: 500.0000 mg | Freq: Every day | ORAL | Status: DC

## 2014-06-10 NOTE — Progress Notes (Signed)
Nutrition Follow-up  DOCUMENTATION CODES:  Non-severe (moderate) malnutrition in context of acute illness/injury  INTERVENTION:  Magic cup in between meals  Needs bowel regimen.   NUTRITION DIAGNOSIS:  Increased nutrient needs related to catabolic illness, cancer and cancer related treatments as evidenced by estimated needs.  Ongoing.   GOAL:  Patient will meet greater than or equal to 90% of their needs  Not met.   MONITOR:  PO intake, Supplement acceptance, Diet advancement, I & O's, Labs  REASON FOR ASSESSMENT:  Malnutrition Screening Tool    ASSESSMENT:  54 y.o. year old male with significant past medical history of cirrhosis, hepatitis C, HTN, dental caries/abscess, ETOH abuse presenting with cerebellar mass.  Pt s/p stereotactic radiosurgery 5/9 and stereotactic suboccipital craniectomy for resection of tumor 5/10.   Pt with pain issues, palliative care now following for pain management. Pt diet changed 5/13 to Dysphagia 1 with Pudding thick liquids. Pt noted to have pharyngeal thrush 5/13 and started on diflucan.  Pt had MBS this afternoon and pt will remain on above diet for now.  Will need to switch his supplement to Magic cup provided by RN from unit stock.   Medications reviewed and include:  Decadron, lantus, novolog, MVI, thiamine, folic acid, florastor, colace, miralax  Labs reviewed:  Na low, Cr low   Height:  Ht Readings from Last 1 Encounters:  05/20/14 5' 9.6" (1.768 m)    Weight:  Wt Readings from Last 1 Encounters:  05/20/14 204 lb 9.4 oz (92.8 kg)    Ideal Body Weight:  75.5 kg  Wt Readings from Last 10 Encounters:  05/20/14 204 lb 9.4 oz (92.8 kg)    BMI:  Body mass index is 29.69 kg/(m^2).  Estimated Nutritional Needs:  Kcal:  6160-7371  Protein:  100-115 grams  Fluid:  2.3-2.6 L/day  Skin:  Reviewed, no issues  Diet Order:  DIET - DYS 1 Room service appropriate?: Yes; Fluid consistency:: Pudding Thick  EDUCATION  NEEDS:  No education needs identified at this time   Intake/Output Summary (Last 24 hours) at 06/10/14 1540 Last data filed at 06/10/14 1400  Gross per 24 hour  Intake 1262.85 ml  Output   1440 ml  Net -177.15 ml    Last BM:  5/14  [place], [place], [place] Pager [phone] After Hours Pager

## 2014-06-10 NOTE — Progress Notes (Signed)
MBSS complete. Full report located under chart review in imaging section.  Myca Perno Paiewonsky, M.A. CCC-SLP [phone]  

## 2014-06-10 NOTE — Progress Notes (Signed)
Daily Progress Note   Patient Name: Ray Garcia       Date: 06/10/2014 DOB: [birth year]  Age: 54 y.o. MRN#: [id number] Attending Physician: Consuella Lose, MD Primary Care Physician: Braxton Feathers, MD Admit Date: 05/20/2014  Reason for Consultation/Follow-up: Establishing goals of care and Pain control  Subjective: Pain is well controlled on 1.5 mg IV/hr. He denies respiratory or GI symptoms Interval Events: No events or changes Length of Stay: 21 days  Current Medications: Scheduled Meds:   antiseptic oral rinse  7 mL Mouth Rinse BID   bethanechol  10 mg Oral TID   dexamethasone  4 mg Intravenous 3 times per day   docusate sodium  100 mg Oral BID   feeding supplement (ENSURE ENLIVE)  237 mL Oral BID BM   fluconazole  200 mg Oral Daily   folic acid  1 mg Oral Daily   gabapentin  200 mg Oral TID   insulin aspart  0-15 Units Subcutaneous 6 times per day   insulin glargine  10 Units Subcutaneous Daily   levofloxacin  500 mg Oral Daily   multivitamin with minerals  1 tablet Oral Daily   oxymetazoline  2 spray Right Nare Once   polyethylene glycol  17 g Oral Daily   saccharomyces boulardii  250 mg Oral BID   thiamine  100 mg Oral Daily    Continuous Infusions:  dextrose 5 % and 0.45% NaCl 50 mL/hr at 06/09/14 1058   HYDROmorphone 1 mg/hr (06/10/14 0913)   HYDROmorphone      PRN Meds: bisacodyl, diphenhydrAMINE **OR** diphenhydrAMINE, hydrALAZINE, HYDROmorphone (DILAUDID) injection, labetalol, LORazepam, meclizine, naloxone **AND** sodium chloride, ondansetron (ZOFRAN) IV, promethazine, promethazine  Palliative Performance Scale: 50%     Vital Signs: BP 154/100 mmHg   Pulse 61   Temp(Src) 97.7 F (36.5 C) (Oral)   Resp 18   Ht 5' 9.6" (1.768 m)   Wt 92.8 kg (204 lb 9.4 oz)   BMI 29.69 kg/m2   SpO2 94% SpO2: SpO2: 94 % O2 Device: O2 Device: Nasal Cannula O2 Flow Rate: O2 Flow Rate (L/min): 4 L/min  Intake/output summary:  Intake/Output Summary  (Last 24 hours) at 06/10/14 1032 Last data filed at 06/10/14 0800  Gross per 24 hour  Intake 1161.02 ml  Output   2235 ml  Net -1073.98 ml   LBM:   Baseline Weight: Weight: 92.8 kg (204 lb 9.4 oz) Most recent weight: Weight: 92.8 kg (204 lb 9.4 oz)  Physical Exam:2 WNWD man in no distress. Speech is husky and hard to understand HEENT- C&S clear, skull - no drainage from surgical site Cor - RRR Pulm - no increased WOB Abd- soft              Additional Data Reviewed: Recent Labs     06/08/14  1300  06/09/14  0205  06/10/14  0207  WBC   --   14.2*  15.6*  HGB   --   14.2  14.5  PLT   --   147*  177  NA  132*   --   131*  BUN  20   --   20  CREATININE  0.48*   --   0.46*     Problem List:  Patient Active Problem List   Diagnosis Date Noted   Cancer associated pain 06/09/2014   Thrush 06/09/2014   Aspiration pneumonia 06/09/2014   Acute respiratory failure with hypoxia 06/09/2014   Brain metastases    Cerebellar mass  Hypoxia    Low O2 saturation    Lung mass    Malnutrition of moderate degree 05/28/2014   S/P biopsy    Dental abscess    Primary cancer of right upper lobe of lung    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 Symptom Management:  Converting to oral medical   4. Palliative Prophylaxis:  Stool [place]: on docusate bid and ethylene glycol daily  5. Prognosis: > 6 - 12 months  5. Discharge Planning: not yet determined  Diabetes - on basal insulin 10 units. Required 24, 22 units 5/14, 5/16 Plan Increase lantus to 15 units daily  Care plan was discussed with patient  Thank you for allowing the Palliative  Medicine Team to assist in the care of this patient.   Time In: 1031 Time Out: 1054 Total Time 23 Prolonged Time Billed  no     Greater than 50%  of this time was spent counseling and coordinating care related to the above assessment and plan.   Adella Hare, MD   06/10/2014, 10:32 AM  Please contact Palliative Medicine Team phone at [phone] for questions and concerns.

## 2014-06-10 NOTE — Progress Notes (Signed)
Pt seen and examined. No issues overnight. Improvement in pain with dilaudid drip.  EXAM: Temp:  [97.6 F (36.4 C)-98.1 F (36.7 C)] 97.7 F (36.5 C) (05/17 0400) Pulse Rate:  [55-68] 61 (05/17 0800) Resp:  [10-28] 18 (05/17 0800) BP: (135-177)/(84-117) 154/100 mmHg (05/17 0800) SpO2:  [91 %-98 %] 94 % (05/17 0800) Intake/Output      05/16 0701 - 05/17 0700 05/17 0701 - 05/18 0700   I.V. (mL/kg) 1160.3 (12.5) 50.8 (0.5)   IV Piggyback 100    Total Intake(mL/kg) 1260.3 (13.6) 50.8 (0.5)   Urine (mL/kg/hr) 2310 (1) 150 (0.6)   Total Output 2310 150   Net -1049.7 -99.3         Awake, alert CN intact x hoarseness of voice Moving all extremities well Wound c/d/i, no leak  LABS: Lab Results  Component Value Date   CREATININE 0.46* 06/10/2014   BUN 20 06/10/2014   NA 131* 06/10/2014   K 4.7 06/10/2014   CL 96* 06/10/2014   CO2 24 06/10/2014   Lab Results  Component Value Date   WBC 15.6* 06/10/2014   HGB 14.5 06/10/2014   HCT 40.1 06/10/2014   MCV 88.3 06/10/2014   PLT 177 06/10/2014    IMPRESSION: - 54 y.o. male s/p suboccipital crani for debulking of lung metastasis with postop dysphagia and pain - Aspiration PNA, requiring supplemental O2.  - Oropharyngeal thrush  PLAN: - Cont PT/OT as tolerated - Levaquin for 5d - Cont diflucan (day 5/7) - Pain control per palliative care service

## 2014-06-10 NOTE — Progress Notes (Signed)
Speech Language Pathology Treatment: Dysphagia  Patient Details Name: Ray Garcia MRN: [id number] DOB: 09/10/1960 Today's Date: 06/10/2014 Time: 2993-7169 SLP Time Calculation (min) (ACUTE ONLY): 10 min  Assessment / Plan / Recommendation Clinical Impression  Pt does not show overt signs of aspiration during PO intake today and is able to tolerate a more upright posture, although he does appear to have increased congestion. He remains afebrile. Today he is agreeable to attempt sitting upright for MBS to better determine least restrictive diet given reduced tolerance of FEES. Will proceed with MBS this afternoon.   HPI Other Pertinent Information: This is a 54 y.o. year old male with PMH of cirrhosis, hepatitis C, HTN, dental caries/abscess, ETOH abuse, presenting with cerebellar mass. Pt underwent biopsy and is s/p craniectomy for partial resection of tumor. Follow up MRI 5/10 shows that the superior portion of the tumor remains, with extension into the left posterior lateral medulla unchanges. Swallow evaluation requested after RN noted difficulty swallowing at bedside, including coughing and expectoration of eggs from breakfast meal.   Pertinent Vitals Pain Assessment: Faces Faces Pain Scale: Hurts little more Pain Location: back Pain Descriptors / Indicators: Grimacing Pain Intervention(s): Limited activity within patient's tolerance;Monitored during session;Repositioned  SLP Plan  MBS    Recommendations Diet recommendations: Dysphagia 1 (puree);Pudding-thick liquid Liquids provided via: Teaspoon Medication Administration: Crushed with puree Supervision: Patient able to self feed;Full supervision/cueing for compensatory strategies Compensations: Slow rate;Small sips/bites Postural Changes and/or Swallow Maneuvers: Seated upright 90 degrees;Upright 30-60 min after meal       Oral Care Recommendations: Oral care BID Follow up Recommendations: Inpatient Rehab;24 hour  supervision/assistance Plan: MBS    Germain Osgood, M.A. CCC-SLP [phone]  Germain Osgood 06/10/2014, 1:21 PM

## 2014-06-10 NOTE — Progress Notes (Signed)
Rehab admissions - I attempted to meet with pt this afternoon, but he was off the floor for MBS study. Per chart review, palliative care is involved and pt is on IV pain meds. I will check on pt's status tomorrow. We will need to see that pt is able to participate in the intensity of rehab program with proper pain control. Pt will need to be off dilaudid PCA prior to rehab admit.  I will check on pt's status tomorrow. Thanks.  Nanetta Batty, PT Rehabilitation Admissions Coordinator [phone]

## 2014-06-10 NOTE — Progress Notes (Addendum)
Name: Ray Garcia MRN: [id number] DOB: [birth year]    ADMISSION DATE:  05/20/2014 CONSULTATION DATE:  5/15  REFERRING MD :  Joya Salm   CHIEF COMPLAINT:  Hypoxia   BRIEF PATIENT DESCRIPTION:  54 year old male w/ multiple medical co-morbids which include: Hep C, cirrhosis, substance abuse, Recently diagnosed w/ metastatic adenocarcinoma of lung - RUL 05/23/14 large > 5cm A w/ Mets to cerebellum. Now s/p  suboccipital crani on 5/10 for tumor resection. Nursing noted decreased O2 sats the early am of 5/15. O2 sats as low as 74% on room air. PCCM was called to assess new hypoxic respiratory failure 06/08/14.   SIGNIFICANT EVENTS  MR brain 5/10: Partial resection of metastatic disease in the left cerebellar tonsil. The superior portion of the tumor remains. Tumor extends into the left posterior lateral medulla also unchanged.No other tumor deposits identified. Pneumocephalus  06/09/2014: c.o pain that is new x 5 days and on right upper chest in back, per RN. To me he says that pain is in back generalized. X 5 days, Progressive, Needing opioids for relief. Is moderate to severe. Has had associated urinary retention and constipation x 2-3 days but no incontinence. No associated dyspnea or cough. Unclear aggravating factors. Denies resp distress. He is on 3L. S/ P FEES 06/06/14: p-er RN - one vocal cord paralyzed. Thrush +. Aspirating +. Pureed diet +. On dlfucan   SUBJECTIVE/OVERNIGHT/INTERVAL HX 06/10/2014:   Old Chart review since my note yesterday: Pall care consult now involved and added gabapentin.  Terminal dx given and patient thinking about code status.    Hx from Per RN: dialudid PCA did not control pain. So dilaudid gtt was added. Per RN '3mg'$ /h made him drowsy but 1-1.'5mg'$ /h has best balance between pain and wakefulness. CIR feels he is not ready due to pneumnia. PT feels he needs rolling walker. Speech recommended D1 diet.  Hypoxemia persists   VITAL SIGNS: Temp:  [97.6 F (36.4 C)-98.1  F (36.7 C)] 97.7 F (36.5 C) (05/17 0400) Pulse Rate:  [55-68] 61 (05/17 0800) Resp:  [10-28] 18 (05/17 0800) BP: (135-177)/(84-117) 154/100 mmHg (05/17 0800) SpO2:  [91 %-98 %] 94 % (05/17 0800)  PHYSICAL EXAMINATION: General:  54 year old male, currently in no acute distress. Lying in bed  Neuro:  Awake, oriented. [place]. Posterior crani dressing CD&I. Moves limbs. [place]opre awake HEENT:  [place], AT  HOARSE VOICE + Cardiovascular:  rrr Lungs:  Decreased bases, no accessory muscle use  Abdomen:  Soft, non-tender + bowel sounds  Musculoskeletal:  Intact  Skin:  Intact  GU: foley +   PULMONARY  Recent Labs Lab 06/08/14 1045  PHART 7.458*  PCO2ART 31.1*  PO2ART 89.6  HCO3 21.7  TCO2 22.6  O2SAT 96.9    CBC  Recent Labs Lab 06/08/14 0225 06/09/14 0205 06/10/14 0207  HGB 13.8 14.2 14.5  HCT 38.3* 38.9* 40.1  WBC 11.9* 14.2* 15.6*  PLT 140* 147* 177    COAGULATION No results for input(s): INR in the last 168 hours.  CARDIAC  No results for input(s): TROPONINI in the last 168 hours. No results for input(s): PROBNP in the last 168 hours.   CHEMISTRY  Recent Labs Lab 06/03/14 0951 06/03/14 1225 06/04/14 0630 06/08/14 1300 06/10/14 0207  NA 133* 133* 138 132* 131*  K 4.5 4.8 4.1 3.7 4.7  CL  --   --  102 98* 96*  CO2  --   --  27 21* 24  GLUCOSE 279* 246* 166* 199*  135*  BUN  --   --  24* 20 20  CREATININE  --   --  0.72 0.48* 0.46*  CALCIUM  --   --  8.7* 8.9 9.0  MG  --   --   --   --  2.1  PHOS  --   --   --   --  3.9   Estimated Creatinine Clearance: 118.6 mL/min (by C-G formula based on Cr of 0.46).   LIVER No results for input(s): AST, ALT, ALKPHOS, BILITOT, PROT, ALBUMIN, INR in the last 168 hours.   INFECTIOUS  Recent Labs Lab 06/09/14 0205 06/09/14 1043 06/10/14 0207  PROCALCITON 1.34 1.14 1.41     ENDOCRINE CBG (last 3)   Recent Labs  06/09/14 2012 06/10/14 0023 06/10/14 0356  GLUCAP 184* 143* 154*              ASSESSMENT / PLAN:  Acute Hypoxic respiratory failure: due  aspiration pneumonitis LLL pneumonia - vocal cord paralysis +, thrush+, aspirated on FEEDS 06/06/14  - needing 3L [place] and currently resp distress severity is mild - not toxic on examm - [place] 06/08/14 - neg for DVt   - unchanged 06/10/14   -Plan Cont supplemental oxygen as needed to keep sats > 92% Cont aspiration precautions  [place] levaquin to po and do 5 more days and then stop  #Pharyngeal thrush noted on FEEDS 06/06/14  - diflucan x7 days with stop date set - await repeat eval for clearance by speech  #Dysphagia with VC paralysis - diet D1 diet per speech   #Pain - likely malignancy relatd  Improved control with dilaudid PCA managed by palliative care  PLAn Opioids per Pall care   #S/p posterior crani for decompression of cerebellar lung mass Adenocarcinoma of lung Plan Per n-surg and heme/onc  #Hypertension  -cotninue bp meds as before   #GLOBAL  - goals, code, and symptom mgmt per Pall care - decaddron per pall care and Neurosrug   PCCM will sign off. Pall care to support patient transition  And symptoms   Dr. Brand Males, M.D., [facility].C.P Pulmonary and Critical Care Medicine Staff Physician [facility] Pager: [phone], If no answer or between  15:00h - 7:00h: call [phone]  06/10/2014 9:18 AM

## 2014-06-11 LAB — CBC WITH DIFFERENTIAL/PLATELET
BASOS PCT: 1 % (ref 0–1)
Basophils Absolute: 0.2 10*3/uL — ABNORMAL HIGH (ref 0.0–0.1)
EOS ABS: 0 10*3/uL (ref 0.0–0.7)
Eosinophils Relative: 0 % (ref 0–5)
HEMATOCRIT: 40.8 % (ref 39.0–52.0)
Hemoglobin: 14.8 g/dL (ref 13.0–17.0)
LYMPHS ABS: 1.9 10*3/uL (ref 0.7–4.0)
LYMPHS PCT: 11 % — AB (ref 12–46)
MCH: 32 pg (ref 26.0–34.0)
MCHC: 36.3 g/dL — AB (ref 30.0–36.0)
MCV: 88.3 fL (ref 78.0–100.0)
Monocytes Absolute: 0.9 10*3/uL (ref 0.1–1.0)
Monocytes Relative: 5 % (ref 3–12)
NEUTROS ABS: 14.4 10*3/uL — AB (ref 1.7–7.7)
Neutrophils Relative %: 83 % — ABNORMAL HIGH (ref 43–77)
Platelets: 208 10*3/uL (ref 150–400)
RBC: 4.62 MIL/uL (ref 4.22–5.81)
RDW: 12.9 % (ref 11.5–15.5)
WBC: 17.4 10*3/uL — ABNORMAL HIGH (ref 4.0–10.5)

## 2014-06-11 LAB — PHOSPHORUS: Phosphorus: 3.5 mg/dL (ref 2.5–4.6)

## 2014-06-11 LAB — BASIC METABOLIC PANEL
ANION GAP: 9 (ref 5–15)
BUN: 21 mg/dL — ABNORMAL HIGH (ref 6–20)
CALCIUM: 8.9 mg/dL (ref 8.9–10.3)
CHLORIDE: 94 mmol/L — AB (ref 101–111)
CO2: 27 mmol/L (ref 22–32)
Creatinine, Ser: 0.57 mg/dL — ABNORMAL LOW (ref 0.61–1.24)
GFR calc Af Amer: >60 mL/min (ref >60)
GFR calc non Af Amer: >60 mL/min (ref >60)
Glucose, Bld: 159 mg/dL — ABNORMAL HIGH (ref 65–99)
Potassium: 4.5 mmol/L (ref 3.5–5.1)
Sodium: 130 mmol/L — ABNORMAL LOW (ref 135–145)

## 2014-06-11 LAB — GLUCOSE, CAPILLARY
GLUCOSE-CAPILLARY: 201 mg/dL — AB (ref 65–99)
GLUCOSE-CAPILLARY: 133 mg/dL — AB (ref 65–99)
GLUCOSE-CAPILLARY: 142 mg/dL — AB (ref 65–99)
Glucose-Capillary: 148 mg/dL — ABNORMAL HIGH (ref 65–99)
Glucose-Capillary: 209 mg/dL — ABNORMAL HIGH (ref 65–99)
Glucose-Capillary: 253 mg/dL — ABNORMAL HIGH (ref 65–99)

## 2014-06-11 LAB — MAGNESIUM: Magnesium: 2.2 mg/dL (ref 1.7–2.4)

## 2014-06-11 LAB — PROCALCITONIN: Procalcitonin: 1.66 ng/mL

## 2014-06-11 MED ORDER — SODIUM CHLORIDE 0.9 % IV SOLN
INTRAVENOUS | Status: DC
  Administered 2014-06-11: 50 mL via INTRAVENOUS
  Administered 2014-06-13 – 2014-06-16 (×3): via INTRAVENOUS

## 2014-06-11 NOTE — Progress Notes (Signed)
Occupational Therapy Treatment Patient Details Name: Ray Garcia MRN: [id number] DOB: [birth year] Today's Date: 06/11/2014    History of present illness This is a 53 y.o. year old male with significant past medical history of cirrhosis, hepatitis C, HTN, dental caries/abscess, ETOH abuse presenting with cerebellar mass. Pt underwent biopsy and has now been to sx for excision of good portion of the tumor.  Palliative Care has started pain management techniques that seem to be helping.   OT comments  Pt still with pain 8-9/10, but was able to participate this am.   He demonstrates increased activity tolerance and requires mod A +2 for functional mobility.   Continue to recommend CIR.   Follow Up Recommendations  CIR;Supervision/Assistance - 24 hour    Equipment Recommendations  3 in 1 bedside comode;Tub/shower bench    Recommendations for Other Services Rehab consult    Precautions / Restrictions Precautions Precautions: Fall Restrictions Weight Bearing Restrictions: No       Mobility Bed Mobility Overal bed mobility: Needs Assistance Bed Mobility: Rolling;Sidelying to Sit;Sit to Sidelying Rolling: Mod assist Sidelying to sit: Mod assist   Sit to supine: Min assist Sit to sidelying: Mod assist General bed mobility comments: Pt requires cues to sequence technique for OOB.  Assist to roll onto Lt side and assist to lift trunk from bed .  Pt fatigued and with increased pain after ambulating and promptly fell back into bed.  Assist to lift feet onto bed   Transfers Overall transfer level: Needs assistance Equipment used: Rolling walker (2 wheeled) Transfers: Sit to/from [facility] Sit to Stand: Mod assist;+2 physical assistance         General transfer comment: Pt required facilitation at bil. rib cage and pelvis.  Cues for anterior translation of trunk     Balance Overall balance assessment: Needs assistance Sitting-balance support: Feet  supported Sitting balance-Leahy Scale: Fair     Standing balance support: Bilateral upper extremity supported Standing balance-Leahy Scale: Poor Standing balance comment: Pt with truncal ataxia.  Requires bil. UEs and mod A +2 to maintain balance.  Facilitation provided bil. rib cage and pelvis to improve trunk stabililty due to ataxia                    ADL Overall ADL's : Needs assistance/impaired Eating/Feeding: Modified independent;Bed level                       Toilet Transfer: Moderate assistance;+2 for physical assistance;Ambulation;Comfort height toilet;RW Armed forces technical officer Details (indicate cue type and reason): Pt facilitation at pelvis and rib cage bil. to maintain truncal stability when ambulating due to ataxia  Toileting- Clothing Manipulation and Hygiene: Total assistance;Sit to/from stand       Functional mobility during ADLs: Moderate assistance;+2 for physical assistance General ADL Comments: Pt ambulated on unit, was unable to engage in LB ADLs due to pain       Vision                     Perception     Praxis      Cognition   Behavior During Therapy: Flat affect Overall Cognitive Status: Impaired/Different from baseline Area of Impairment: Attention;Following commands;Safety/judgement;Problem solving   Current Attention Level: Sustained Memory: Decreased short-term memory  Following Commands: Follows one step commands consistently (with cues ) Safety/Judgement: Decreased awareness of deficits;Decreased awareness of safety   Problem Solving: Slow processing;Decreased initiation;Difficulty sequencing;Requires verbal cues;Requires tactile cues General Comments:  Unsure how much pain medications are contributing.  Pt with low volume so very difficult to understand at times.   Pt requires instructions repeated several times before following.      Extremity/Trunk Assessment               Exercises     Shoulder Instructions        General Comments      Pertinent Vitals/ Pain       Pain Assessment: Faces Faces Pain Scale: Hurts whole lot Pain Location: back Pain Descriptors / Indicators: Aching;Constant;Grimacing Pain Intervention(s): Premedicated before session;Monitored during session  Home Living                                          Prior Functioning/Environment              Frequency Min 3X/week     Progress Toward Goals  OT Goals(current goals can now be found in the care plan section)  Progress towards OT goals: Progressing toward goals  Acute Rehab OT Goals Patient Stated Goal: to get better ADL Goals Pt Will Perform Grooming: with supervision;sitting Pt Will Perform Upper Body Bathing: with supervision;sitting Pt Will Perform Upper Body Dressing: with supervision;sitting Pt Will Transfer to Toilet: with min assist;bedside commode;ambulating Pt Will Perform Toileting - Clothing Manipulation and hygiene: with min assist;sit to/from stand Additional ADL Goal #1: Pt will demonstrate emergent awreness of complesatory techniques for trunkal ataxia during ADL with min vc  Plan Discharge plan remains appropriate    Co-evaluation    PT/OT/SLP Co-Evaluation/Treatment: Yes Reason for Co-Treatment: Complexity of the patient's impairments (multi-system involvement) PT goals addressed during session: Mobility/safety with mobility OT goals addressed during session: ADL's and self-care      End of Session Equipment Utilized During Treatment: Rolling walker;Oxygen   Activity Tolerance Patient limited by pain   Patient Left in bed;with call bell/phone within reach;with bed alarm set   Nurse Communication Mobility status        Time: 1040-1106 OT Time Calculation (min): 26 min  Charges: OT General Charges $OT Visit: 1 Procedure OT Treatments $Neuromuscular Re-education: 8-22 mins  Ray Garcia M 06/11/2014, 2:24 PM

## 2014-06-11 NOTE — Progress Notes (Signed)
Physical Therapy Treatment Patient Details Name: Ray Garcia MRN: [id number] DOB: 12/19/1960 Today's Date: 06/11/2014    History of Present Illness This is a 54 y.o. year old male with significant past medical history of cirrhosis, hepatitis C, HTN, dental caries/abscess, ETOH abuse presenting with cerebellar mass. Pt underwent biopsy and has now been to sx for excision of good portion of the tumor.  Palliative Care has started pain management techniques that seem to be helping.    PT Comments    Pt able to participate better today due mainly to better pain management.  Ataxia remains noticeable causing gait instability with need for truncal facilitation/support.  Follow Up Recommendations  CIR     Equipment Recommendations  Rolling walker with 5" wheels    Recommendations for Other Services       Precautions / Restrictions Precautions Precautions: Fall Restrictions Weight Bearing Restrictions: No    Mobility  Bed Mobility Overal bed mobility: Needs Assistance Bed Mobility: Rolling;Sidelying to Sit;Sit to Sidelying Rolling: Mod assist Sidelying to sit: Mod assist   Sit to supine: Min assist Sit to sidelying: Mod assist General bed mobility comments: Pt requires cues to sequence technique for OOB.  Assist to roll onto Lt side and assist to lift trunk from bed .  Pt fatigued and with increased pain after ambulating and promptly fell back into bed.  Assist to lift feet onto bed   Transfers Overall transfer level: Needs assistance Equipment used: Rolling walker (2 wheeled) Transfers: Sit to/from [facility] Sit to Stand: Mod assist;+2 physical assistance         General transfer comment: Pt required facilitation at bil. rib cage and pelvis.  Cues for anterior translation of trunk   Ambulation/Gait Ambulation/Gait assistance: Mod assist;+2 physical assistance Ambulation Distance (Feet): 150 Feet Assistive device: Rolling walker (2 wheeled)   Gait  velocity: slower   General Gait Details: less LE ataxia, some truncal ataxia   Stairs            Wheelchair Mobility    Modified Rankin (Stroke Patients Only)       Balance Overall balance assessment: Needs assistance Sitting-balance support: Feet supported Sitting balance-Leahy Scale: Fair     Standing balance support: Bilateral upper extremity supported Standing balance-Leahy Scale: Poor Standing balance comment: Pt with truncal ataxia.  Requires bil. UEs and mod A +2 to maintain balance.  Facilitation provided bil. rib cage and pelvis to improve trunk stabililty due to ataxia                     Cognition Arousal/Alertness: Awake/alert Behavior During Therapy: Flat affect Overall Cognitive Status: Impaired/Different from baseline Area of Impairment: Attention;Following commands;Safety/judgement;Problem solving   Current Attention Level: Sustained Memory: Decreased short-term memory Following Commands:  (with cues ) Safety/Judgement: Decreased awareness of deficits;Decreased awareness of safety   Problem Solving: Slow processing;Decreased initiation;Difficulty sequencing;Requires verbal cues;Requires tactile cues General Comments: Unsure how much pain medications are contributing.  Pt with low volume so very difficult to understand at times.   Pt requires instructions repeated several times before following.      Exercises      General Comments        Pertinent Vitals/Pain Pain Assessment: Faces Faces Pain Scale: Hurts whole lot Pain Location: back Pain Descriptors / Indicators: Aching;Constant;Grimacing Pain Intervention(s): Premedicated before session;Monitored during session    Home Living  Prior Function            PT Goals (current goals can now be found in the care plan section) Acute Rehab PT Goals Patient Stated Goal: to get better PT Goal Formulation: With patient Time For Goal Achievement:  06/19/14 Potential to Achieve Goals: Good Progress towards PT goals: Progressing toward goals    Frequency  Min 3X/week    PT Plan Current plan remains appropriate    Co-evaluation PT/OT/SLP Co-Evaluation/Treatment: Yes Reason for Co-Treatment: Complexity of the patient's impairments (multi-system involvement) PT goals addressed during session: Mobility/safety with mobility OT goals addressed during session: ADL's and self-care     End of Session   Activity Tolerance: Patient tolerated treatment well;Patient limited by fatigue (pain not as limiting) Patient left: in bed;with call bell/phone within reach     Time: 1040-1106 PT Time Calculation (min) (ACUTE ONLY): 26 min  Charges:  $Gait Training: 8-22 mins                    G Codes:      [name], Tessie Fass 06/11/2014, 2:47 PM 06/11/2014  Donnella Sham, PT [phone]  (pager)

## 2014-06-11 NOTE — Progress Notes (Signed)
Rehab admissions - I continue to follow pt's case and have noted that pt is in the process of transitioning from IV dilaudid to oral pain meds. We would like to see how pt tolerates this transition and how he participates in therapies with oral pain control.  I will check on pt's status tomorrow. Thanks.  Nanetta Batty, PT Rehabilitation Admissions Coordinator [phone]

## 2014-06-11 NOTE — Progress Notes (Signed)
Pt seen and examined. No issues overnight. Dilaudid gtt was stopped last pm, pt is ok this am. Does still c/o pain 7/10. Able to ambulate hallway with PT this am.  EXAM: Temp:  [97.5 F (36.4 C)-97.6 F (36.4 C)] 97.6 F (36.4 C) (05/18 0400) Pulse Rate:  [57-120] 63 (05/18 1000) Resp:  [10-23] 14 (05/18 1000) BP: (129-157)/(69-129) 136/79 mmHg (05/18 1000) SpO2:  [87 %-98 %] 95 % (05/18 1000) Intake/Output      05/17 0701 - 05/18 0700 05/18 0701 - 05/19 0700   P.O.  180   I.V. (mL/kg) 1164.8 (12.6) 150 (1.6)   IV Piggyback     Total Intake(mL/kg) 1164.8 (12.6) 330 (3.6)   Urine (mL/kg/hr) 1685 (0.8) 300 (0.7)   Total Output 1685 300   Net -520.2 +30         Awake, alert CN grossly intact Moving all extremities well.  LABS: Lab Results  Component Value Date   CREATININE 0.57* 06/11/2014   BUN 21* 06/11/2014   NA 130* 06/11/2014   K 4.5 06/11/2014   CL 94* 06/11/2014   CO2 27 06/11/2014   Lab Results  Component Value Date   WBC 17.4* 06/11/2014   HGB 14.8 06/11/2014   HCT 40.8 06/11/2014   MCV 88.3 06/11/2014   PLT 208 06/11/2014    IMPRESSION: - 54 y.o. male s/p debulking of cerebellar tumor.  - aspiration PNA - thrush - dysphagia - continued aspiration on swallow study - mild hyponatremia  PLAN: - appreciate palliative care assistance in pain mgmt. Cont efforts to transition to oral pain meds - Cont levaquin for 4 more days - cont fluconazole - Dysphagia 1 diet - change IVF to NS - Cont mobilization with PT/OT. If able to transition to oral meds, hopefully can go to CIR in the next day or two.

## 2014-06-12 ENCOUNTER — Other Ambulatory Visit: Payer: Self-pay

## 2014-06-12 ENCOUNTER — Telehealth: Payer: Self-pay | Admitting: Hematology and Oncology

## 2014-06-12 ENCOUNTER — Ambulatory Visit: Payer: Self-pay

## 2014-06-12 ENCOUNTER — Ambulatory Visit: Payer: Self-pay | Admitting: Hematology and Oncology

## 2014-06-12 LAB — CBC WITH DIFFERENTIAL/PLATELET
BASOS PCT: 1 % (ref 0–1)
Basophils Absolute: 0.2 10*3/uL — ABNORMAL HIGH (ref 0.0–0.1)
EOS ABS: 0 10*3/uL (ref 0.0–0.7)
Eosinophils Relative: 0 % (ref 0–5)
HCT: 42 % (ref 39.0–52.0)
HEMOGLOBIN: 14.9 g/dL (ref 13.0–17.0)
LYMPHS ABS: 1.8 10*3/uL (ref 0.7–4.0)
Lymphocytes Relative: 11 % — ABNORMAL LOW (ref 12–46)
MCH: 31.3 pg (ref 26.0–34.0)
MCHC: 35.5 g/dL (ref 30.0–36.0)
MCV: 88.2 fL (ref 78.0–100.0)
MONO ABS: 0.8 10*3/uL (ref 0.1–1.0)
Monocytes Relative: 5 % (ref 3–12)
NEUTROS PCT: 83 % — AB (ref 43–77)
Neutro Abs: 14 10*3/uL — ABNORMAL HIGH (ref 1.7–7.7)
Platelets: 205 10*3/uL (ref 150–400)
RBC: 4.76 MIL/uL (ref 4.22–5.81)
RDW: 12.8 % (ref 11.5–15.5)
WBC: 16.8 10*3/uL — ABNORMAL HIGH (ref 4.0–10.5)

## 2014-06-12 LAB — GLUCOSE, CAPILLARY
GLUCOSE-CAPILLARY: 145 mg/dL — AB (ref 65–99)
GLUCOSE-CAPILLARY: 251 mg/dL — AB (ref 65–99)
Glucose-Capillary: 130 mg/dL — ABNORMAL HIGH (ref 65–99)
Glucose-Capillary: 147 mg/dL — ABNORMAL HIGH (ref 65–99)
Glucose-Capillary: 156 mg/dL — ABNORMAL HIGH (ref 65–99)
Glucose-Capillary: 174 mg/dL — ABNORMAL HIGH (ref 65–99)
Glucose-Capillary: 190 mg/dL — ABNORMAL HIGH (ref 65–99)

## 2014-06-12 LAB — BASIC METABOLIC PANEL
ANION GAP: 10 (ref 5–15)
BUN: 19 mg/dL (ref 6–20)
CO2: 27 mmol/L (ref 22–32)
Calcium: 8.7 mg/dL — ABNORMAL LOW (ref 8.9–10.3)
Chloride: 95 mmol/L — ABNORMAL LOW (ref 101–111)
Creatinine, Ser: 0.47 mg/dL — ABNORMAL LOW (ref 0.61–1.24)
GFR calc Af Amer: >60 mL/min (ref >60)
GFR calc non Af Amer: >60 mL/min (ref >60)
Glucose, Bld: 139 mg/dL — ABNORMAL HIGH (ref 65–99)
POTASSIUM: 4.5 mmol/L (ref 3.5–5.1)
SODIUM: 132 mmol/L — AB (ref 135–145)

## 2014-06-12 LAB — PHOSPHORUS: PHOSPHORUS: 3.6 mg/dL (ref 2.5–4.6)

## 2014-06-12 LAB — MAGNESIUM: Magnesium: 2.3 mg/dL (ref 1.7–2.4)

## 2014-06-12 MED ORDER — FLEET ENEMA 7-19 GM/118ML RE ENEM
1.0000 | ENEMA | Freq: Once | RECTAL | Status: AC | PRN
  Filled 2014-06-12: qty 1

## 2014-06-12 MED ORDER — FLEET ENEMA 7-19 GM/118ML RE ENEM
1.0000 | ENEMA | Freq: Once | RECTAL | Status: DC
  Filled 2014-06-12: qty 1

## 2014-06-12 NOTE — Progress Notes (Signed)
SLP Cancellation Note  Patient Details Name: Ray Garcia MRN: [id number] DOB: 05/05/1960   Cancelled treatment:       Reason Eval/Treat Not Completed: Patient declined as he had just eaten lunch with wife. Will continue efforts.   Germain Osgood, M.A. CCC-SLP [phone]  Germain Osgood 06/12/2014, 1:42 PM

## 2014-06-12 NOTE — Telephone Encounter (Signed)
Patient is r/s to 06/06 @ 9:45 w/Dr. Alvy Bimler

## 2014-06-12 NOTE — Clinical Social Work Note (Signed)
Clinical Social Worker spoke with patient wife over the phone per patient wife request to RN.  Patient wife would like to inquire about patient returning home with hospice vs. Inpatient rehab.  Patient wife plans to meet with inpatient rehab admissions coordinator and then with case management regarding discharge plans.  No further social work needs identified.  CSW signing off.  Please reconsult if further needs arise prior to discharge.  Barbette Or, [place]

## 2014-06-12 NOTE — Progress Notes (Signed)
Rehab admissions - I am following pt's case and received update from Leonard, Education officer, museum that wife may be interested in pursuing home with hospice. I met with pt's wife this am and she is confused as to what to do. I encouraged pt's wife to speak further with palliative care.  At this point, pt was on IV dilaudid this am. He did participate yesterday with therapy yesterday. We would need pt off IV pain meds and able to tolerate the intensity of rehab program. Wife is aware of this.  I will check on patient's status tomorrow. I called and updated Sharyn Lull, case Freight forwarder as well.  Thanks.  Nanetta Batty, PT Rehabilitation Admissions Coordinator [phone]

## 2014-06-12 NOTE — Progress Notes (Signed)
Pt seen and examined. No issues overnight.   EXAM: Temp:  [97.2 F (36.2 C)-98.5 F (36.9 C)] 97.8 F (36.6 C) (05/19 1154) Pulse Rate:  [25-79] 74 (05/19 1400) Resp:  [10-24] 20 (05/19 1400) BP: (133-162)/(80-99) 140/94 mmHg (05/19 1400) SpO2:  [93 %-100 %] 95 % (05/19 1400) Intake/Output      05/18 0701 - 05/19 0700 05/19 0701 - 05/20 0700   P.O. 297 150   I.V. (mL/kg) 1240 (13.4) 206 (2.2)   Total Intake(mL/kg) 1537 (16.6) 356 (3.8)   Urine (mL/kg/hr) 2655 (1.2) 375 (0.5)   Total Output 2655 375   Net -1118 -19         Awake, somewhat drowsy this am CN grossly intact MAE well  LABS: Lab Results  Component Value Date   CREATININE 0.47* 06/12/2014   BUN 19 06/12/2014   NA 132* 06/12/2014   K 4.5 06/12/2014   CL 95* 06/12/2014   CO2 27 06/12/2014   Lab Results  Component Value Date   WBC 16.8* 06/12/2014   HGB 14.9 06/12/2014   HCT 42.0 06/12/2014   MCV 88.2 06/12/2014   PLT 205 06/12/2014    IMPRESSION: - 54 y.o. male s/p SRS and debulking of cerebellar lung met - PNA/hypoxia appears to be improving - pain remains an issue  PLAN: - Will cont to try to transition to PO pain meds - Cont levaquin - Will cont to work toward placement in [place] when stable  I spoke with the pts wife and pastor this am. At this point, he has not had initial consultation or complete w/u for his primary lung adenocarcinoma. I also believe he would benefit from CIR to maximize functional independence. I did tell the wife that he could be seen by the oncology team at [facility] before making the decision about home hospice care. While home hospice would certainly be an option, I think it prudent for him to at least be evaluated by the onc team prior to this decision.

## 2014-06-13 DIAGNOSIS — K047 Periapical abscess without sinus: Secondary | ICD-10-CM

## 2014-06-13 LAB — GLUCOSE, CAPILLARY
GLUCOSE-CAPILLARY: 166 mg/dL — AB (ref 65–99)
Glucose-Capillary: 102 mg/dL — ABNORMAL HIGH (ref 65–99)
Glucose-Capillary: 131 mg/dL — ABNORMAL HIGH (ref 65–99)
Glucose-Capillary: 144 mg/dL — ABNORMAL HIGH (ref 65–99)
Glucose-Capillary: 208 mg/dL — ABNORMAL HIGH (ref 65–99)
Glucose-Capillary: 280 mg/dL — ABNORMAL HIGH (ref 65–99)

## 2014-06-13 LAB — CBC WITH DIFFERENTIAL/PLATELET
Basophils Absolute: 0.2 10*3/uL — ABNORMAL HIGH (ref 0.0–0.1)
Basophils Relative: 1 % (ref 0–1)
EOS PCT: 0 % (ref 0–5)
Eosinophils Absolute: 0 10*3/uL (ref 0.0–0.7)
HCT: 40.9 % (ref 39.0–52.0)
HEMOGLOBIN: 14.9 g/dL (ref 13.0–17.0)
LYMPHS ABS: 1.7 10*3/uL (ref 0.7–4.0)
Lymphocytes Relative: 11 % — ABNORMAL LOW (ref 12–46)
MCH: 31.9 pg (ref 26.0–34.0)
MCHC: 36.4 g/dL — ABNORMAL HIGH (ref 30.0–36.0)
MCV: 87.6 fL (ref 78.0–100.0)
MONOS PCT: 5 % (ref 3–12)
Monocytes Absolute: 0.8 10*3/uL (ref 0.1–1.0)
Neutro Abs: 13.1 10*3/uL — ABNORMAL HIGH (ref 1.7–7.7)
Neutrophils Relative %: 83 % — ABNORMAL HIGH (ref 43–77)
Platelets: 188 10*3/uL (ref 150–400)
RBC: 4.67 MIL/uL (ref 4.22–5.81)
RDW: 12.9 % (ref 11.5–15.5)
WBC: 15.8 10*3/uL — AB (ref 4.0–10.5)

## 2014-06-13 LAB — BASIC METABOLIC PANEL
Anion gap: 10 (ref 5–15)
BUN: 19 mg/dL (ref 6–20)
CHLORIDE: 94 mmol/L — AB (ref 101–111)
CO2: 27 mmol/L (ref 22–32)
Calcium: 8.9 mg/dL (ref 8.9–10.3)
Creatinine, Ser: 0.49 mg/dL — ABNORMAL LOW (ref 0.61–1.24)
GFR calc Af Amer: >60 mL/min (ref >60)
GFR calc non Af Amer: >60 mL/min (ref >60)
Glucose, Bld: 142 mg/dL — ABNORMAL HIGH (ref 65–99)
POTASSIUM: 4.5 mmol/L (ref 3.5–5.1)
SODIUM: 131 mmol/L — AB (ref 135–145)

## 2014-06-13 LAB — MAGNESIUM: MAGNESIUM: 2.1 mg/dL (ref 1.7–2.4)

## 2014-06-13 LAB — PHOSPHORUS: PHOSPHORUS: 3.3 mg/dL (ref 2.5–4.6)

## 2014-06-13 MED ORDER — FENTANYL 75 MCG/HR TD PT72
75.0000 ug | MEDICATED_PATCH | TRANSDERMAL | Status: DC
  Administered 2014-06-13: 75 ug via TRANSDERMAL
  Filled 2014-06-13 (×2): qty 1

## 2014-06-13 MED ORDER — DEXAMETHASONE SODIUM PHOSPHATE 4 MG/ML IJ SOLN
8.0000 mg | Freq: Two times a day (BID) | INTRAMUSCULAR | Status: DC
  Administered 2014-06-13 – 2014-06-17 (×9): 8 mg via INTRAVENOUS
  Filled 2014-06-13 (×9): qty 2

## 2014-06-13 MED ORDER — HYDROMORPHONE HCL 2 MG PO TABS
6.0000 mg | ORAL_TABLET | ORAL | Status: AC
  Administered 2014-06-13 (×2): 6 mg via ORAL
  Filled 2014-06-13 (×2): qty 3

## 2014-06-13 MED ORDER — HYDROMORPHONE HCL 1 MG/ML IJ SOLN
1.0000 mg | INTRAMUSCULAR | Status: DC | PRN
  Administered 2014-06-14 – 2014-06-17 (×14): 1 mg via INTRAVENOUS
  Filled 2014-06-13 (×14): qty 1

## 2014-06-13 MED ORDER — GABAPENTIN 300 MG PO CAPS
300.0000 mg | ORAL_CAPSULE | Freq: Three times a day (TID) | ORAL | Status: DC
  Administered 2014-06-13 – 2014-06-15 (×6): 300 mg via ORAL
  Filled 2014-06-13 (×7): qty 1

## 2014-06-13 NOTE — Progress Notes (Signed)
Daily Progress Note   Patient Name: Ray Garcia       Date: 06/13/2014 DOB: 24-Oct-1960  Age: 54 y.o. 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 Mass to RUL greater 5cm. Pt underwent suboccipital crani on 06/03/14 for tumor resection. Tumor extends into the left posterior lateral medulla which is unchanged. Pt has had severe HA and continues to verbalize pain to the right side of base of skull which radiates up behind his ear as a 9/10. His shoulders are sore. Slight focal tenderness to back of head and shoulders.He was on a continuous dilaudid infusion at '1mg'$ /hr which was turned off 06/12/14 at 0800 per RN. Marland Kitchen He is now getting Dilaudid '6mg'$  q4 atc with dilaudid '1mg'$  q1mn prn for break through pain. He has received 1 break through dose since midnight. He does verbalize that pain is continuous, sharp as well as throbbing. He is having trouble swallowing and all pills are crushed and he is on a pureed diet. Despite his high pain rating he states he feels better than yesterday. Per pt and RN pt was very sedated. Pt stated that he hated that feeling. At this point it does not appear that patient is going to rehab within the hospital.  Interval Events: Dilaudid infusion stopped at 06/12/14 0800 Length of Stay: 24 days  Current Medications: Scheduled Meds:  . antiseptic oral rinse  7 mL Mouth Rinse BID  . bethanechol  10 mg Oral TID  . dexamethasone  4 mg Intravenous 3 times per day  . docusate sodium  100 mg Oral BID  . folic acid  1 mg Oral Daily  . gabapentin  200 mg Oral TID  . HYDROmorphone  6 mg Oral Q4H  . insulin aspart  0-15 Units Subcutaneous 6 times per day  . insulin glargine  15 Units Subcutaneous Daily  . levofloxacin  500  mg Oral Daily  . multivitamin with minerals  1 tablet Oral Daily  . oxymetazoline  2 spray Right Nare Once  . polyethylene glycol  17 g Oral Daily  . saccharomyces boulardii  250 mg Oral BID  . thiamine  100 mg Oral Daily    Continuous Infusions: . sodium chloride 50 mL/hr at 06/13/14 1100  . HYDROmorphone 1 mg/hr (06/12/14 1530)    PRN Meds: bisacodyl, diphenhydrAMINE **OR** diphenhydrAMINE, hydrALAZINE, HYDROmorphone, labetalol, LORazepam, meclizine, naloxone **AND** sodium chloride, ondansetron (ZOFRAN) IV, promethazine, promethazine  Palliative Performance Scale: 30-40%     Vital Signs: BP 151/96 mmHg  Pulse 90  Temp(Src) 97 F (36.1 C) (Axillary)  Resp 16  Ht 5' 9.6" (1.768 m)  Wt 92.8 kg (204 lb 9.4 oz)  BMI 29.69 kg/m2  SpO2 92% SpO2: SpO2: 92 % O2 Device: O2 Device: Nasal Cannula O2 Flow Rate: O2 Flow Rate (L/min): 2 L/min  Intake/output summary:  Intake/Output Summary (Last 24 hours) at 06/13/14 1203 Last data filed at 06/13/14 1100  Gross per 24 hour  Intake   1150 ml  Output   2235 ml  Net  -1085 ml   LBM:  5/19 Baseline Weight: Weight: 92.8 kg (204 lb 9.4 oz) Most recent weight: Weight: 92.8 kg (204 lb 9.4 oz)  Physical Exam: General: Well nourished middle aged man. Appears in pain, withdrawn Resp:  No work of breathing. Clear anteriorally Cardiac: RRR Musculoskeletal: Decreased ROM to neck. No edema to lower extremities Pscyh: Somnolent. Withdrawn              Additional Data Reviewed: Recent Labs     06/12/14  0248  06/13/14  0220  WBC  16.8*  15.8*  HGB  14.9  14.9  PLT  205  188  NA  132*  131*  BUN  19  19  CREATININE  0.47*  0.49*     Problem List:  Patient Active Problem List   Diagnosis Date Noted  . Cancer associated pain 06/09/2014  . Thrush 06/09/2014  . Aspiration pneumonia 06/09/2014  . Acute respiratory failure with hypoxia 06/09/2014  . Brain metastases   . Cerebellar mass   . Hypoxia   . Low O2 saturation   .  Lung mass   . Malnutrition of moderate degree 05/28/2014  . S/P biopsy   . Dental abscess   . Primary cancer of right upper lobe of lung   . 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 Pt has been in severe pain and this is a very new dx. Remains a full code  Desire for further Chaplaincy support:no  3. Symptom Management:  Pain: Pt has been on dilaudid '6mg'$  PO q4 atc with 1 dose of iv dilaudid for breakthrough since midnight. Continuous infusion of dilaudid turned off at 0800 on 5/19. Since pain is still a 9 will convert to fentanyl td 7mg q72hours for long acting pain management with dilaudid '6mg'$  po q4 prn for breakthrough pain . Additionally will leave iv dilaudid '1mg'$  q2 prn for severe pain as fentanyl patch reaches therapeutic level. Will also change times for decadron to '8mg'$  in am at 2pm to avoid 10pm dosing. Pt reports he can't sleep. Will up titrate Neurontin '300mg'$  TID. Lyrica may also be an option where we could reach a therapeutic dose more quickly for neuropathic pain component.  Constipation: Pt disimpacted yesterday and given dulcolax suppository. Continue miralax daily and dulcolax suppository prn. Goal for BM every 3 days  4. Palliative Prophylaxis:  Stool Softner: Continue colace and schedule miralax  5. Prognosis: < 6 months  5. Discharge Planning: At this point pt remains a full code with out-patient oncology appointment planned   Care plan was discussed with Pt, RN, Dr. GHilma Favors Thank you for allowing the Palliative Medicine Team to assist in the care of this patient.   Time In: 1030 Time Out: 1130 Total Time 60 Prolonged Time Billed  no    Greater than 50%  of this time was spent counseling and coordinating care related to the above assessment and plan.   SDory Horn NP  06/13/2014, 12:03 PM  Please contact Palliative  Medicine Team phone at [phone] questions and concerns.

## 2014-06-13 NOTE — Progress Notes (Signed)
Occupational Therapy Treatment Patient Details Name: Ray Garcia MRN: [id number] DOB: 05-07-1960 Today's Date: 06/13/2014    History of present illness This is a 54 y.o. year old male with significant past medical history of cirrhosis, hepatitis C, HTN, dental caries/abscess, ETOH abuse presenting with cerebellar mass. Pt underwent biopsy and has now been to sx for excision of good portion of the tumor.  Palliative Care has started pain management techniques that seem to be helping.   OT comments  Pt apparently planning to D/C home with hospice services. Pt requires increased assistance for ADL and mobility and will need 24/7 assistance after D/C. Pt will need the below DME to help care for pt within the home. Will plan to educate family on techniques to help decrease burden of care.   Follow Up Recommendations  Other (comment);Supervision/Assistance - 24 hour    Equipment Recommendations  3 in 1 bedside comode;Tub/shower bench;Wheelchair cushion (measurements OT);Wheelchair (measurements OT);Hospital bed    Recommendations for Other Services      Precautions / Restrictions Precautions Precautions: Fall       Mobility Bed Mobility Overal bed mobility: Needs Assistance Bed Mobility: Rolling;Sidelying to [place], OTR/L  [phone] 06/13/2014 Rolling: Min assist Sidelying to sit: Mod assist       General bed mobility comments: Pt using therapist to assist with mobility. Using rail  Transfers Overall transfer level: Needs assistance Equipment used: Rolling walker (2 wheeled) Transfers: Sit to/from Stand Sit to Stand: Mod assist;+2 physical assistance         General transfer comment: assist to power up to stand. mod A at times during mobility due to BLE leg weakness    Balance Overall balance assessment: Needs assistance   Sitting balance-Leahy Scale: Fair     Standing balance support: During functional activity;Bilateral upper extremity supported Standing  balance-Leahy Scale: Poor                     ADL                                       Functional mobility during ADLs: Moderate assistance;+2 for physical assistance General ADL Comments: limited participation today. Attempted to toilet pt, however, pt with c/o pain and requesting to rest.                                      Cognition   Behavior During Therapy: Flat affect Overall Cognitive Status: Impaired/Different from baseline Area of Impairment: Attention;Following commands;Safety/judgement;Problem solving   Current Attention Level: Sustained Memory: Decreased short-term memory  Following Commands: Follows one step commands consistently Safety/Judgement: Decreased awareness of safety Awareness: Emergent Problem Solving: Slow processing      Extremity/Trunk Assessment     BUE generalized weakness                      General Comments      Pertinent Vitals/ Pain       Pain Assessment: 0-10 Pain Score: 9  Faces Pain Scale: Hurts even more Pain Location: back Pain Descriptors / Indicators: Aching;Grimacing;Guarding;Moaning Pain Intervention(s): Limited activity within patient's tolerance;Repositioned  Home Living  Prior Functioning/Environment              Frequency Min 2X/week     Progress Toward Goals  OT Goals(current goals can now be found in the care plan section)  Progress towards OT goals: OT to reassess next treatment  Acute Rehab OT Goals Patient Stated Goal: to get better OT Goal Formulation: With patient/family Time For Goal Achievement: 06/20/14 Potential to Achieve Goals: Good ADL Goals Pt Will Perform Grooming: with supervision;sitting Pt Will Perform Upper Body Bathing: with supervision;sitting Pt Will Perform Upper Body Dressing: with supervision;sitting Pt Will Transfer to Toilet: with min assist;bedside  commode;ambulating Pt Will Perform Toileting - Clothing Manipulation and hygiene: with min assist;sit to/from stand Additional ADL Goal #1: Pt will demonstrate emergent awreness of complesatory techniques for trunkal ataxia during ADL with min vc  Plan Discharge plan needs to be updated;Frequency needs to be updated    Co-evaluation    PT/OT/SLP Co-Evaluation/Treatment: Yes Reason for Co-Treatment: Complexity of the patient's impairments (multi-system involvement);For patient/therapist safety   OT goals addressed during session: ADL's and self-care;Other (comment) (mobility)      End of Session Equipment Utilized During Treatment: Rolling walker;Oxygen   Activity Tolerance Patient limited by pain   Patient Left in chair;with call bell/phone within reach   Nurse Communication Mobility status        Time: 2883-3744 OT Time Calculation (min): 25 min  Charges: OT General Charges $OT Visit: 1 Procedure OT Treatments $Self Care/Home Management : 8-22 mins  Ray Garcia,Ray Garcia 06/13/2014, 3:25 PM

## 2014-06-13 NOTE — Progress Notes (Signed)
[facility]ILIT[facility]  CONSULT SERVICE NOTE  Pt seen. Chart reviewed. Have spoken to some of his treatment team. Given his ongoing pain issues and prognosis, I believe the most appropriate thing to do at this time is to pursue dc home with home health therapy and palliatve care services. I think he would struggle mightily trying to participate in 3 hours of therapy per day.   Meredith Staggers, MD, [facility] 06/13/2014

## 2014-06-13 NOTE — Progress Notes (Signed)
Rehab admissions - I met with pt's wife today to share update from Dr. Naaman Plummer, rehab MD. He is recommending to pursue dc home with home health therapy and palliatve care services. See his earlier note for further details.  Wife was understanding of this recommendation and understands that case manager will reach out to her to help plan for discharge. I called and spoke with Julie/Michelle case manager also.  I will now sign off pt's case. Thanks.  Nanetta Batty, PT Rehabilitation Admissions Coordinator [phone]

## 2014-06-13 NOTE — Progress Notes (Signed)
Speech Language Pathology Treatment: Dysphagia  Patient Details Name: Sena Clouatre MRN: [id number] DOB: 1960-10-23 Today's Date: 06/13/2014 Time: 1446-1500 SLP Time Calculation (min) (ACUTE ONLY): 14 min  Assessment / Plan / Recommendation Clinical Impression  Pt seen for dysphagia therapy with pudding thick grape juice. Cough x 1 due to suspected penetration and multiple swallows likely from pharyngeal residue. He required moderate verbal cues for small bites and introduced 2 swallow strategy. Continue puree texture and pudding thick liquids. Plan is for transfer home with hospice. Will continue treatment primarily for family education.   HPI Other Pertinent Information: This is a 54 y.o. year old male with PMH of cirrhosis, hepatitis C, HTN, dental caries/abscess, ETOH abuse, presenting with cerebellar mass. Pt underwent biopsy and is s/p craniectomy for partial resection of tumor. Follow up MRI 5/10 shows that the superior portion of the tumor remains, with extension into the left posterior lateral medulla unchanges. Swallow evaluation requested after RN noted difficulty swallowing at bedside, including coughing and expectoration of eggs from breakfast meal.   Pertinent Vitals Pain Assessment: Faces Faces Pain Scale: Hurts even more Pain Location: head Pain Intervention(s): Patient requesting pain meds-RN notified  SLP Plan  Continue with current plan of care    Recommendations Diet recommendations: Dysphagia 1 (puree);Pudding-thick liquid Liquids provided via: Teaspoon Medication Administration: Crushed with puree Supervision: Patient able to self feed;Full supervision/cueing for compensatory strategies Compensations: Slow rate;Small sips/bites Postural Changes and/or Swallow Maneuvers: Seated upright 90 degrees;Upright 30-60 min after meal              Oral Care Recommendations: Oral care BID Follow up Recommendations: 24 hour supervision/assistance Plan: Continue with current  plan of care    GO     Houston Siren 06/13/2014, 3:24 PM   Orbie Pyo Colvin Caroli.[employer] [phone]

## 2014-06-13 NOTE — Progress Notes (Addendum)
Spoke with Dr. Kathyrn Sheriff about CIR decision and subsequent d/c plans. Advised him that family had reached out to the [facility] agency that serves [place].  MD had thought there would be at least an oncology consult as outpatient at the cancer center to determine what options were available to the patient prior to engaging hospice.  Stated that he had discussed that with the patient's wife yesterday.  I went to patient's room and spoke with patient, wife and pt's friend, Rica Mote.  Wife stated that she prefers to take the patient home and provide his care, that she did the same for the patient's mother in the past and is aware of what is required to provide full time care to a patient in this condition.  She does want him to have a follow up consult with the cancer center to determine what his options are.  She stated that with a hospital bed, a wheelchair, a rolling walker and a bedside commode she could manage the patient. She also wishes to have a list of medical transport services that she can use to get him to appointments. Medicaid application is in process but pt will need to use [place] as potential charity case until Medicaid is active.  Address and all phone numbers in EPIC confirmed as correct.  I have spoken with the hospice contact, Candie Echevaria [phone]) and advised her that hospice is on hold for now until the patient learns his options from the oncologist. She stated that she would stay in touch with the wife.   Pt is still getting pain medications under control with transition today from IV Dilaudid to a Fentanyl patch. Will need pain control with no IV meds to be able to d/c home. Updated bedside RN on this.   Sandi Mariscal, RN BSN MHA CCM  Case Manager, Trauma Service/Unit 45[facility] [phone]

## 2014-06-13 NOTE — Progress Notes (Signed)
Patient arrived to [address] from [facility]. He is alert, denied any pain. Dinner at bedside. Safety precautions and orders reviewed with patient. No distress noted. Will continue to monitor.   Ave Filter, RN

## 2014-06-13 NOTE — Progress Notes (Signed)
Wasted 85cc of Dilauded gtt with Otho Najjar, RN

## 2014-06-13 NOTE — Progress Notes (Signed)
Physical Therapy Treatment Patient Details Name: Ray Garcia MRN: [id number] DOB: [birth year] Today's Date: 06/13/2014    History of Present Illness This is a 54 y.o. year old male with significant past medical history of cirrhosis, hepatitis C, HTN, dental caries/abscess, ETOH abuse presenting with cerebellar mass. Pt underwent biopsy and has now been to sx for excision of good portion of the tumor.  Palliative Care has started pain management techniques that seem to be helping.    PT Comments    Per update, patient planning to D/C home with hospice services. Pt requires increased assistance for mobility and will need 24/7 assistance after D/C. Pt will need the below DME to help care for pt within the home. Will plan to educate family on techniques to help decrease burden of care.  OF NOTE: patient with desaturations below 90 on 2 liters, increased to 4 liters for activity. Limited activity tolerance.  Follow Up Recommendations  Supervision/Assistance - 24 hour     Equipment Recommendations  Rolling walker with 5" wheels;Wheelchair (measurements PT);Hospital bed    Recommendations for Other Services       Precautions / Restrictions Precautions Precautions: Fall Restrictions Weight Bearing Restrictions: No    Mobility  Bed Mobility Overal bed mobility: Needs Assistance Bed Mobility: Rolling;Sidelying to Sit Rolling: Min assist Sidelying to sit: Mod assist       General bed mobility comments: Pt using therapist to assist with mobility. Using rail  Transfers Overall transfer level: Needs assistance Equipment used: Rolling walker (2 wheeled) Transfers: Sit to/from Stand Sit to Stand: Mod assist;+2 physical assistance         General transfer comment: assist to power up to stand. mod A at times during mobility due to BLE leg weakness  Ambulation/Gait Ambulation/Gait assistance: Mod assist Ambulation Distance (Feet): 40 Feet Assistive device: Rolling walker (2  wheeled) Gait Pattern/deviations: Step-through pattern;Narrow base of support Gait velocity: decreased Gait velocity interpretation: <1.8 ft/sec, indicative of risk for recurrent falls General Gait Details: truncal ataxia noted intermittently, patient very quickly fatigued, unable to tolerate extensive ambulation.    Stairs            Wheelchair Mobility    Modified Rankin (Stroke Patients Only)       Balance Overall balance assessment: Needs assistance Sitting-balance support: Feet supported Sitting balance-Leahy Scale: Fair     Standing balance support: During functional activity;Bilateral upper extremity supported Standing balance-Leahy Scale: Poor                      Cognition Arousal/Alertness: Awake/alert Behavior During Therapy: Flat affect Overall Cognitive Status: Impaired/Different from baseline Area of Impairment: Attention;Following commands;Safety/judgement;Problem solving   Current Attention Level: Sustained Memory: Decreased short-term memory Following Commands: Follows one step commands consistently Safety/Judgement: Decreased awareness of safety Awareness: Emergent Problem Solving: Slow processing      Exercises      General Comments        Pertinent Vitals/Pain Pain Assessment: 0-10 Pain Score: 9  Faces Pain Scale: Hurts even more Pain Location: back Pain Descriptors / Indicators: Aching;Grimacing;Guarding;Moaning Pain Intervention(s): Limited activity within patient's tolerance;Repositioned    Home Living                      Prior Function            PT Goals (current goals can now be found in the care plan section) Acute Rehab PT Goals Patient Stated Goal: to get better PT Goal  Formulation: With patient Time For Goal Achievement: 06/19/14 Potential to Achieve Goals: Good Progress towards PT goals: Progressing toward goals    Frequency  Min 3X/week    PT Plan Discharge plan needs to be  updated;Equipment recommendations need to be updated    Co-evaluation PT/OT/SLP Co-Evaluation/Treatment: Yes Reason for Co-Treatment: Complexity of the patient's impairments (multi-system involvement);For patient/therapist safety PT goals addressed during session: Mobility/safety with mobility OT goals addressed during session: ADL's and self-care;Other (comment) (mobility)     End of Session Equipment Utilized During Treatment: Gait belt Activity Tolerance: Patient tolerated treatment well;Patient limited by fatigue Patient left: in chair;with call bell/phone within reach (with SLP)     Time: 3664-4034 PT Time Calculation (min) (ACUTE ONLY): 24 min  Charges:  $Therapeutic Activity: 8-22 mins                    G CodesDuncan Dull 19-Jun-2014, 4:56 PM Alben Deeds, [place] DPT  [phone]

## 2014-06-13 NOTE — Progress Notes (Signed)
Pt seen and examined. No issues overnight. Has not been on IV dilaudid gtt, only 2-3 IV boluses. Has required supplemental O2 by [place] overnight.  EXAM: Temp:  [97 F (36.1 C)-98.3 F (36.8 C)] 97 F (36.1 C) (05/20 0755) Pulse Rate:  [43-87] 63 (05/20 0600) Resp:  [10-24] 11 (05/20 0600) BP: (123-158)/(78-98) 148/98 mmHg (05/20 0600) SpO2:  [90 %-99 %] 93 % (05/20 0600) Intake/Output      05/19 0701 - 05/20 0700 05/20 0701 - 05/21 0700   P.O. 150    I.V. (mL/kg) 1156 (12.5)    Total Intake(mL/kg) 1306 (14.1)    Urine (mL/kg/hr) 2110 (0.9)    Stool 0 (0)    Total Output 2110     Net -804          Stool Occurrence 1 x     Awake, drowsy. Answers questions appropriately MAE well  LABS: Lab Results  Component Value Date   CREATININE 0.49* 06/13/2014   BUN 19 06/13/2014   NA 131* 06/13/2014   K 4.5 06/13/2014   CL 94* 06/13/2014   CO2 27 06/13/2014   Lab Results  Component Value Date   WBC 15.8* 06/13/2014   HGB 14.9 06/13/2014   HCT 40.9 06/13/2014   MCV 87.6 06/13/2014   PLT 188 06/13/2014    IMPRESSION: - 54 y.o. male s/p resection of cerebellar lung met - Aspiration PNA  PLAN: - Pt and wife to decide if they would like to pursue CIR. He is off IV dilaudid requiring only occasional boluses, and could potentially be transferred. - Outpatient oncology consult reschedule for early June. I would imagine this could be done while at CIR.

## 2014-06-14 DIAGNOSIS — Z515 Encounter for palliative care: Secondary | ICD-10-CM

## 2014-06-14 LAB — GLUCOSE, CAPILLARY
GLUCOSE-CAPILLARY: 140 mg/dL — AB (ref 65–99)
Glucose-Capillary: 122 mg/dL — ABNORMAL HIGH (ref 65–99)
Glucose-Capillary: 128 mg/dL — ABNORMAL HIGH (ref 65–99)
Glucose-Capillary: 142 mg/dL — ABNORMAL HIGH (ref 65–99)
Glucose-Capillary: 201 mg/dL — ABNORMAL HIGH (ref 65–99)

## 2014-06-14 MED ORDER — TAMSULOSIN HCL 0.4 MG PO CAPS
0.4000 mg | ORAL_CAPSULE | Freq: Every day | ORAL | Status: DC
  Administered 2014-06-14 – 2014-06-18 (×5): 0.4 mg via ORAL
  Filled 2014-06-14 (×5): qty 1

## 2014-06-14 NOTE — Progress Notes (Signed)
Daily Progress Note   Patient Name: Ray Garcia       Date: 06/14/2014 DOB: [birth year]  Age: 54 y.o. MRN#: [id number] Attending Physician: Consuella Lose, MD Primary Care Physician: Braxton Feathers, MD Admit Date: 05/20/2014  Reason for Consultation/Follow-up: Pain control  Subjective: Pt responds to voice but  Otherwise not conversant. He answers inconsistently in terms of his pain. Per chart review he is charted as saying his pain is better and he tells me it is better. When asked what it is numerically he states 9-10 which is not different from yesterday. Pain is in neck radiating behind his right eat up his skull. See fewer prn's since starting fentanyl TD. He also has ben unable to urinate and has required 2 in/out cath after bladder scan. Scan done while I was in the room showed 750cc in bladder. Pt denied having any sensation of the need to void. Interval Events: Transferred out of neuro icu to floor Length of Stay: 25 days  Current Medications: Scheduled Meds:  . antiseptic oral rinse  7 mL Mouth Rinse BID  . bethanechol  10 mg Oral TID  . dexamethasone  8 mg Intravenous Q12H  . docusate sodium  100 mg Oral BID  . fentaNYL  75 mcg Transdermal Q72H  . folic acid  1 mg Oral Daily  . gabapentin  300 mg Oral TID  . insulin aspart  0-15 Units Subcutaneous 6 times per day  . insulin glargine  15 Units Subcutaneous Daily  . multivitamin with minerals  1 tablet Oral Daily  . oxymetazoline  2 spray Right Nare Once  . polyethylene glycol  17 g Oral Daily  . saccharomyces boulardii  250 mg Oral BID  . tamsulosin  0.4 mg Oral QPC supper  . thiamine  100 mg Oral Daily    Continuous Infusions: . sodium chloride 50 mL/hr at 06/14/14 0055    PRN Meds: bisacodyl, diphenhydrAMINE **OR** diphenhydrAMINE, hydrALAZINE, HYDROmorphone (DILAUDID) injection, labetalol, LORazepam, meclizine, naloxone **AND** sodium chloride, ondansetron (ZOFRAN) IV, promethazine, promethazine  Palliative  Performance Scale: 30-40%     Vital Signs: BP 141/82 mmHg  Pulse 65  Temp(Src) 98.1 F (36.7 C) (Oral)  Resp 20  Ht 5' 9.6" (1.768 m)  Wt 92.8 kg (204 lb 9.4 oz)  BMI 29.69 kg/m2  SpO2 99% SpO2: SpO2: 99 % O2 Device: O2 Device: Nasal Cannula O2 Flow Rate: O2 Flow Rate (L/min): 2 L/min  Intake/output summary:  Intake/Output Summary (Last 24 hours) at 06/14/14 1303 Last data filed at 06/14/14 0245  Gross per 24 hour  Intake    200 ml  Output   1550 ml  Net  -1350 ml   LBM:   Baseline Weight: Weight: 92.8 kg (204 lb 9.4 oz) Most recent weight: Weight: 92.8 kg (204 lb 9.4 oz)  Physical Exam: General: Well nourished middle aged man. Withdrawn. Appears in pain  Resp: No work of breathing observed. Clear anteriorally Cardiac: RRR Musculoskeletal: Can move all extremities . Minimal RON cervically. When he does move he grimaces GU: Bladder distended, non-tender. Per bladder scan 750cc in bladder Psych: Affect flat. He deneis depression states " my neck hurts" . Answers questions inconsistantly            Additional Data Reviewed: Recent Labs     06/12/14  0248  06/13/14  0220  WBC  16.8*  15.8*  HGB  14.9  14.9  PLT  205  188  NA  132*  131*  BUN  19  19  CREATININE  0.47*  0.49*     Problem List:  Patient Active Problem List   Diagnosis Date Noted  . Cancer associated pain 06/09/2014  . Thrush 06/09/2014  . Aspiration pneumonia 06/09/2014  . Acute respiratory failure with hypoxia 06/09/2014  . Brain metastases   . Cerebellar mass   . Hypoxia   . Low O2 saturation   . Lung mass   . Malnutrition of moderate degree 05/28/2014  . S/P biopsy   . Dental abscess   . Primary cancer of right upper lobe of lung   . 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 Would need to have family participate in this but would like to see pt able to engage in this decision making to some degree if possible  Desire for further Chaplaincy support: not at this time  3. Symptom Management:  Pain: On paper pt appears to be in less pain in terms of seeing fewer prn's but concerned that we are missing perhaps a neuropathic component to pain that not adequately addressed by Neurontin and opioids. Methadone might be a good choice as a long acting medication that would address neuropathic pain as well as somatic pain but it is not clear at this time what is out patient follow up will be. Encouraged nursing to please pre-medicate prior to any ADL's and to specifically ask him about his neck. Will continue to monitor prns and up titrate fentanyl TD. Continue Neurontin '300mg'$  TID and decadron 8 mg daily. Will ask supervising MD to check with interventional pain mgt doctor on Mon to see if he has anything to offer. Will further discuss methadone as well  Urinary retention: Reinsert foley. Start Flomax. Will attempt to dc foley after 3 days of flomax  5. Prognosis: < 6 months  5. Discharge Planning: TBD   Care plan was discussed with pt and Dr. Deitra Mayo  Thank you for allowing the Palliative Medicine Team to assist in the care of this patient.   Time In: 1200 Time Out: 1240 Total Time 40 Prolonged Time Billed  no    Greater than 50%  of this time was spent counseling and coordinating care related to the above assessment and plan.   Dory Horn, NP  06/14/2014, 1:03 PM  Please contact Palliative Medicine Team phone at [phone] for questions and concerns.

## 2014-06-14 NOTE — Progress Notes (Signed)
Patient unable to void. bladderscan  575cc in and out cath obtained 650cc of clear yellow urine.

## 2014-06-14 NOTE — Progress Notes (Signed)
Overall stable. No new issues or problems. Continue current management.

## 2014-06-15 LAB — GLUCOSE, CAPILLARY
GLUCOSE-CAPILLARY: 190 mg/dL — AB (ref 65–99)
GLUCOSE-CAPILLARY: 238 mg/dL — AB (ref 65–99)
Glucose-Capillary: 118 mg/dL — ABNORMAL HIGH (ref 65–99)
Glucose-Capillary: 165 mg/dL — ABNORMAL HIGH (ref 65–99)
Glucose-Capillary: 178 mg/dL — ABNORMAL HIGH (ref 65–99)
Glucose-Capillary: 238 mg/dL — ABNORMAL HIGH (ref 65–99)

## 2014-06-15 MED ORDER — SENNOSIDES-DOCUSATE SODIUM 8.6-50 MG PO TABS
1.0000 | ORAL_TABLET | Freq: Every day | ORAL | Status: DC
  Administered 2014-06-15 – 2014-06-17 (×3): 1 via ORAL
  Filled 2014-06-15 (×3): qty 1

## 2014-06-15 MED ORDER — POLYETHYLENE GLYCOL 3350 17 G PO PACK: 17.0000 g | PACK | Freq: Every day | ORAL | Status: DC | PRN

## 2014-06-15 MED ORDER — LORAZEPAM 2 MG/ML IJ SOLN
1.0000 mg | Freq: Every day | INTRAMUSCULAR | Status: DC
  Administered 2014-06-15: 1 mg via INTRAVENOUS
  Filled 2014-06-15: qty 1

## 2014-06-15 MED ORDER — GABAPENTIN 400 MG PO CAPS
400.0000 mg | ORAL_CAPSULE | Freq: Three times a day (TID) | ORAL | Status: DC
  Administered 2014-06-15 – 2014-06-18 (×10): 400 mg via ORAL
  Filled 2014-06-15 (×10): qty 1

## 2014-06-15 MED ORDER — FENTANYL 25 MCG/HR TD PT72
100.0000 ug | MEDICATED_PATCH | TRANSDERMAL | Status: DC
  Administered 2014-06-15 – 2014-06-18 (×2): 100 ug via TRANSDERMAL
  Filled 2014-06-15 (×4): qty 1

## 2014-06-15 NOTE — Progress Notes (Signed)
Overall stable. No new issues or problems. Continue current management.

## 2014-06-15 NOTE — Progress Notes (Signed)
Daily Progress Note   Patient Name: Ray Garcia       Date: 06/15/2014 DOB: 04-02-1960  Age: 54 y.o. MRN#: [id number] Attending Physician: Consuella Lose, MD Primary Care Physician: Braxton Feathers, MD Admit Date: 05/20/2014  Reason for Consultation/Follow-up: Pain control  Only pt in the room. History from RN, chart and pt  Subjective: Still reporting pain behind the right ear. He is still withdrawn but slightly more conversant. Per RN fed himself breakfast and lunch. Chart reviewed. Do not see many prn's for break through pain, but concerned that given his withdrawn state we are under managing his pain. Interval Events: none Length of Stay: 26 days  Current Medications: Scheduled Meds:  . antiseptic oral rinse  7 mL Mouth Rinse BID  . bethanechol  10 mg Oral TID  . dexamethasone  8 mg Intravenous Q12H  . fentaNYL  100 mcg Transdermal Q72H  . folic acid  1 mg Oral Daily  . gabapentin  400 mg Oral TID  . insulin aspart  0-15 Units Subcutaneous 6 times per day  . insulin glargine  15 Units Subcutaneous Daily  . LORazepam  1 mg Intravenous QHS  . multivitamin with minerals  1 tablet Oral Daily  . oxymetazoline  2 spray Right Nare Once  . saccharomyces boulardii  250 mg Oral BID  . senna-docusate  1 tablet Oral QHS  . tamsulosin  0.4 mg Oral QPC supper  . thiamine  100 mg Oral Daily    Continuous Infusions: . sodium chloride 50 mL/hr at 06/14/14 0055    PRN Meds: bisacodyl, diphenhydrAMINE **OR** diphenhydrAMINE, hydrALAZINE, HYDROmorphone (DILAUDID) injection, labetalol, LORazepam, meclizine, naloxone **AND** sodium chloride, ondansetron (ZOFRAN) IV, polyethylene glycol, promethazine, promethazine  Palliative Performance Scale: 40%     Vital Signs: BP 123/88 mmHg  Pulse 74  Temp(Src) 97.7 F (36.5 C) (Oral)  Resp 16  Ht 5' 9.6" (1.768 m)  Wt 92.8 kg (204 lb 9.4 oz)  BMI 29.69 kg/m2  SpO2 98% SpO2: SpO2: 98 % O2 Device: O2 Device: Nasal Cannula O2 Flow  Rate: O2 Flow Rate (L/min): 2 L/min  Intake/output summary:  Intake/Output Summary (Last 24 hours) at 06/15/14 1615 Last data filed at 06/15/14 1222  Gross per 24 hour  Intake    360 ml  Output   1850 ml  Net  -1490 ml   LBM:  06/14/14 Baseline Weight: Weight: 92.8 kg (204 lb 9.4 oz) Most recent weight: Weight: 92.8 kg (204 lb 9.4 oz)  Physical Exam: General: Middle aged well nourished man. He is grimacing. Keeps his eyes close but does speak to me more today Resp: Clear to auscultation anteriorally GI: Distended, hyperactive bowel sounds Cardiac: RRR Musculoskeletal: Any movement with neck cause severe pain. Mild focal tenderness behind right ear and base of neck             Additional Data Reviewed: Recent Labs     06/13/14  0220  WBC  15.8*  HGB  14.9  PLT  188  NA  131*  BUN  19  CREATININE  0.49*     Problem List:  Patient Active Problem List   Diagnosis Date Noted  . Palliative care encounter   . Cancer associated pain 06/09/2014  . Thrush 06/09/2014  . Aspiration pneumonia 06/09/2014  . Acute respiratory failure with hypoxia 06/09/2014  . Brain metastases   . Cerebellar mass   . Hypoxia   . Low O2 saturation   . Lung mass   .  Malnutrition of moderate degree 05/28/2014  . S/P biopsy   . Dental abscess   . Primary cancer of right upper lobe of lung   . 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 Wife not present and would recommend when he is in less pain that we set up a family meeting to review options and topics important to Mr. And Mrs. Stocks  Desire for further Chaplaincy support: no  3. Symptom Management:  Pain: Will increase fentanyl td to 100 mcg. Feel strongly that pt has unmanaged pain and that evidence does support  increasing dosage at 48 hours   Insomnia: Pt states he cannot sleep. He does not appear to be able to ask for prn's that are available to him either for insomnia, anxiety or pain. Will schedule ativan 1 mg qhs   Constipation: Pt has difficulty swallowing. Will change miralax to prn and try dulcolax 10 mg qhs  4. Palliative Prophylaxis:  Stool Softner: colace  5. Prognosis: < 6 months. I have not shared this prognosis with pt  5. Discharge Planning: TBD   Care plan was discussed with pt and RN  Thank you for allowing the Palliative Medicine Team to assist in the care of this patient.   Time In: 1500 Time Out: 1530 30  Prolonged Time Billed  no    Greater than 50%  of this time was spent counseling and coordinating care related to the above assessment and plan.   Dory Horn, NP  06/15/2014, 4:15 PM  Please contact Palliative Medicine Team phone at [phone] for questions and concerns.

## 2014-06-16 LAB — GLUCOSE, CAPILLARY
GLUCOSE-CAPILLARY: 207 mg/dL — AB (ref 65–99)
Glucose-Capillary: 102 mg/dL — ABNORMAL HIGH (ref 65–99)
Glucose-Capillary: 108 mg/dL — ABNORMAL HIGH (ref 65–99)
Glucose-Capillary: 123 mg/dL — ABNORMAL HIGH (ref 65–99)

## 2014-06-16 MED ORDER — LORAZEPAM 1 MG PO TABS
1.0000 mg | ORAL_TABLET | Freq: Every day | ORAL | Status: DC
  Administered 2014-06-16 – 2014-06-17 (×2): 1 mg via ORAL
  Filled 2014-06-16 (×2): qty 1

## 2014-06-16 MED ORDER — INSULIN GLARGINE 100 UNIT/ML ~~LOC~~ SOLN
20.0000 [IU] | Freq: Every day | SUBCUTANEOUS | Status: DC
  Administered 2014-06-17 – 2014-06-18 (×2): 20 [IU] via SUBCUTANEOUS
  Filled 2014-06-16 (×2): qty 0.2

## 2014-06-16 NOTE — Progress Notes (Signed)
Daily Progress Note   Patient Name: Ray Garcia       Date: 06/16/2014 DOB: 14-Nov-1960  Age: 54 y.o. MRN#: [id number] Attending Physician: Consuella Lose, MD Primary Care Physician: Braxton Feathers, MD Admit Date: 05/20/2014  Reason for Consultation/Follow-up: Establishing goals of care, Non pain symptom management and Pain control  Subjective: Ray Garcia is a bit somnolent. He states the pain is present but somewhat controlled. RN reports that he rates his pain as a 10 whenever he is asked. No other c/o or issues Interval Events: DM mgt - on lantus 15 u. Last two days required 16-18 units of supplemental insulin. Plan - increase Lantus to 20 units daily. Stop q4 CBGs, check only if symptomatic.   Length of Stay: 27 days  Current Medications: Scheduled Meds:   antiseptic oral rinse  7 mL Mouth Rinse BID   bethanechol  10 mg Oral TID   dexamethasone  8 mg Intravenous Q12H   fentaNYL  100 mcg Transdermal R48N   folic acid  1 mg Oral Daily   gabapentin  400 mg Oral TID   insulin aspart  0-15 Units Subcutaneous 6 times per day   insulin glargine  15 Units Subcutaneous Daily   LORazepam  1 mg Oral QHS   multivitamin with minerals  1 tablet Oral Daily   oxymetazoline  2 spray Right Nare Once   saccharomyces boulardii  250 mg Oral BID   senna-docusate  1 tablet Oral QHS   tamsulosin  0.4 mg Oral QPC supper   thiamine  100 mg Oral Daily    Continuous Infusions:  sodium chloride 50 mL/hr at 06/14/14 0055    PRN Meds: bisacodyl, diphenhydrAMINE **OR** diphenhydrAMINE, hydrALAZINE, HYDROmorphone (DILAUDID) injection, labetalol, meclizine, naloxone **AND** sodium chloride, ondansetron (ZOFRAN) IV, polyethylene glycol, promethazine, promethazine  Palliative Performance Scale: 50-60%     Vital Signs: BP 125/84 mmHg   Pulse 70   Temp(Src) 97.5 F (36.4 C) (Oral)   Resp 16   Ht 5' 9.6" (1.768 m)   Wt 92.8 kg (204 lb 9.4 oz)   BMI 29.69 kg/m2   SpO2 100% SpO2:  SpO2: 100 % O2 Device: O2 Device: Nasal Cannula O2 Flow Rate: O2 Flow Rate (L/min): 2 L/min  Intake/output summary:  Intake/Output Summary (Last 24 hours) at 06/16/14 1317 Last data filed at 06/16/14 4627  Gross per 24 hour  Intake    240 ml  Output   1300 ml  Net  -1060 ml   LBM:   Baseline Weight: Weight: 92.8 kg (204 lb 9.4 oz) Most recent weight: Weight: 92.8 kg (204 lb 9.4 oz)  Physical Exam:  Sitting up in a chair in no acute distress but somewhat somnolent Resp- normal respirations Neuro - somnolent, speech is hard to understand.              Additional Data Reviewed: No results for input(s): WBC, HGB, PLT, NA, BUN, CREATININE, ALB in the last 72 hours.   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 Symptom Management:  Pain remains refractory: requiring IV dilaudid 2-4 times a day. Fentanyl patch increased to 100 mcg 5/22.   4. Palliative Prophylaxis:  Stool Softner: orders in place for miralax and dulcolax.  5. Prognosis: < 6 months  5. Discharge Planning: Home with Hospice  Ray Garcia with a large primary lung cancer RUL with extensive brain mets. He has refractory pain. Plan: 1. is to simplify his regimen, e.g. Stop CBGs and rely on basal insulin therapy.  2. Consult Dr. Urbano Heir for interventional pain management 3. Make DNR 4. Conference with family 5/24 at noon about goals of care with a trend towards home with hospice.  Care plan was discussed with patient and wife  Thank you for allowing the Palliative Medicine Team to assist in the care of this patient.   Time In: 1307 Time Out: 1403 Total Time 1404 Prolonged Time Billed  no     Greater than 50%  of this time was spent counseling and coordinating care related to the above assessment and plan.   Adella Hare, MD Palliative Care Team   06/16/2014, 1:17 PM  Please contact Palliative Medicine Team phone at [phone] for questions and concerns.

## 2014-06-16 NOTE — Progress Notes (Signed)
Chaplain responded to referral from pt nurse. Chaplain has exisiting relationship with pt and family. Chaplain reminded pt of chaplain services should he need them. Chaplain will continue to follow.   06/16/14 1600  Clinical Encounter Type  Visited With Patient  Visit Type Follow-up;Spiritual support  Referral From Nurse  Spiritual Encounters  Spiritual Needs Emotional;Prayer  Larey Dresser Epifanio Lesches 06/16/2014 4:03 PM

## 2014-06-16 NOTE — Progress Notes (Signed)
Physical Therapy Treatment Patient Details Name: Ray Garcia MRN: [id number] DOB: 28-Apr-1960 Today's Date: 06/16/2014    History of Present Illness This is a 54 y.o. year old male with significant past medical history of cirrhosis, hepatitis C, HTN, dental caries/abscess, ETOH abuse presenting with cerebellar mass. Pt underwent biopsy and has now been to sx for excision of good portion of the tumor.  Palliative Care has started pain management techniques that seem to be helping.    PT Comments    Patient more pleasant this session. Was able to hold full conversation and also patient did laugh during session. Patient with increased motivation to get OOB but no desire to ambulate as lunch had arrived and patient wanted to eat and have pain medicine. Will continue to see and progress as tolerated.  Follow Up Recommendations  Supervision/Assistance - 24 hour     Equipment Recommendations  Rolling walker with 5" wheels;Wheelchair (measurements PT);Hospital bed    Recommendations for Other Services       Precautions / Restrictions Precautions Precautions: Fall Restrictions Weight Bearing Restrictions: No    Mobility  Bed Mobility Overal bed mobility: Needs Assistance Bed Mobility: Rolling;Sidelying to Sit Rolling: Min assist Sidelying to sit: Mod assist       General bed mobility comments: Pt using therapist to assist with mobility. Using rail  Transfers Overall transfer level: Needs assistance Equipment used: Rolling walker (2 wheeled)   Sit to Stand: Mod assist;+2 physical assistance         General transfer comment: assist to power up to stand. mod A at times during mobility due to BLE leg weakness  Ambulation/Gait             General Gait Details: does not wish to ambulate at this time secondary to pain and arrival of lunch   Stairs            Wheelchair Mobility    Modified Rankin (Stroke Patients Only)       Balance   Sitting-balance  support: Feet supported Sitting balance-Leahy Scale: Fair Sitting balance - Comments: tolerated EOB well this session     Standing balance-Leahy Scale: Poor                      Cognition Arousal/Alertness: Awake/alert Behavior During Therapy: Flat affect               Awareness: Emergent Problem Solving: Slow processing      Exercises      General Comments        Pertinent Vitals/Pain Pain Assessment: 0-10 Pain Score: 8  Pain Location: neck and back Pain Descriptors / Indicators: Aching;Discomfort Pain Intervention(s): Limited activity within patient's tolerance;Monitored during session;Repositioned;Patient requesting pain meds-RN notified    Home Living                      Prior Function            PT Goals (current goals can now be found in the care plan section) Acute Rehab PT Goals Patient Stated Goal: to get better PT Goal Formulation: With patient Time For Goal Achievement: 06/19/14 Potential to Achieve Goals: Good Progress towards PT goals: Progressing toward goals    Frequency  Min 3X/week    PT Plan Current plan remains appropriate    Co-evaluation             End of Session Equipment Utilized During Treatment: Gait belt Activity Tolerance: Patient tolerated  treatment well;Patient limited by fatigue Patient left: in chair;with call bell/phone within reach;with nursing/sitter in room (nsg tech to provide supervision for lunch)     Time: 0940-7680 PT Time Calculation (min) (ACUTE ONLY): 16 min  Charges:  $Therapeutic Activity: 8-22 mins                    G CodesDuncan Dull July 02, 2014, 1:26 PM Alben Deeds, [place] DPT  [phone]

## 2014-06-16 NOTE — Progress Notes (Signed)
No issues overnight. Pt says pain is "okay"   EXAM:  BP 123/91 mmHg  Pulse 91  Temp(Src) 98.5 F (36.9 C) (Oral)  Resp 16  Ht 5' 9.6" (1.768 m)  Wt 92.8 kg (204 lb 9.4 oz)  BMI 29.69 kg/m2  SpO2 93%  Awake but drowsy Speech fluent, appropriate  CN grossly intact  5/5 BUE/BLE   IMPRESSION:  54 y.o. 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 May likely go home with home hospice. - Outpatient onc f/u rescheduled for beginning of June.

## 2014-06-16 NOTE — Progress Notes (Signed)
Nutrition Follow-up  DOCUMENTATION CODES:  Non-severe (moderate) malnutrition in context of acute illness/injury  INTERVENTION:  Magic cupTID with meals  NUTRITION DIAGNOSIS:  Increased nutrient needs related to catabolic illness, cancer and cancer related treatments as evidenced by estimated needs.  Ongoing  GOAL:  Patient will meet greater than or equal to 90% of their needs  Being met  MONITOR:  PO intake, Supplement acceptance, Diet advancement, I & O's, Labs  REASON FOR ASSESSMENT:  Malnutrition Screening Tool    ASSESSMENT: 53 y.o. year old male with significant past medical history of cirrhosis, hepatitis C, HTN, dental caries/abscess, ETOH abuse presenting with cerebellar mass.  Pt s/p stereotactic radiosurgery 5/9 and stereotactic suboccipital craniectomy for resection of tumor 5/10.   Pt asleep in chair at time of visit. Per nursing notes, pt has been eating 100% of meals the past few days. Pt appears thinner than previous visit however, no recent weights obtained. Pt being followed by palliative care.   Labs: glucose ranging 102 to 280 mg/dL  Height:  Ht Readings from Last 1 Encounters:  05/20/14 5' 9.6" (1.768 m)    Weight:  Wt Readings from Last 1 Encounters:  05/20/14 204 lb 9.4 oz (92.8 kg)    Ideal Body Weight:  75.5 kg  Wt Readings from Last 10 Encounters:  05/20/14 204 lb 9.4 oz (92.8 kg)    BMI:  Body mass index is 29.69 kg/(m^2).  Estimated Nutritional Needs:  Kcal:  8676-7209  Protein:  100-115 grams  Fluid:  2.3-2.6 L/day  Skin:  Wound (see comment) (closed incision on head)  Diet Order:  DIET - DYS 1 Room service appropriate?: Yes; Fluid consistency:: Pudding Thick  EDUCATION NEEDS:  No education needs identified at this time   Intake/Output Summary (Last 24 hours) at 06/16/14 1612 Last data filed at 06/16/14 0625  Gross per 24 hour  Intake    240 ml  Output   1300 ml  Net  -1060 ml    Last BM:  5/21  Pryor Ochoa RD, LDN Inpatient Clinical Dietitian Pager: [phone] After Hours Pager: [phone]

## 2014-06-17 MED ORDER — HYDROMORPHONE HCL 2 MG PO TABS
1.0000 mg | ORAL_TABLET | ORAL | Status: DC | PRN
  Administered 2014-06-18: 1 mg via ORAL
  Filled 2014-06-17: qty 1

## 2014-06-17 NOTE — Consult Note (Signed)
Reason for Consult:additional pain management   Referring Physician: Dr. Calvert Cantor Zehnder is an 54 y.o. male.  HPI: Ray Garcia is a 54 y.o. right handed male with past medical history of cirrhosis, hepatitis C hypertension,  alcohol and tobacco abuse, who presented 05/20/2014 from a referring hospital with recurrent intermittent headaches as well as nausea and dizziness over a 2 week period.  Cranial CT scan at the other hospital showed abnormal appearance of a left cerebellar hemisphere concerning for mass. Referred to [facility] for further evaluation, where his work up included CT of the chest/abdomen/pelvis showing a 7.7 cm mass located in the right upper lobe. Underwent CT-guided right upper lobe mass biopsy. MRI of the brain showed a 32 x 25 x 37 mm densely cellular left cerebellar mass extending into the left inferior peduncle and medulla. Underwent stereotactic radiosurgery 06/02/2014 with suboccipital craniectomy resection of tumor by Dr. Kathyrn Sheriff on 5/10.  Swallowing and aspiration pneumonia.  He has continued to report substantial right sided neck and head pain, and shoulder pain as well. Palliative care is being consulted, and  Dr. Joella Prince has been working with the patient, modifying his medications.  He was initially maintained on a PCA, and is transitioned to largely by mouth meds with intermittent (2-3 times per day) IV Dilaudid.  Yesterday his fentanyl patch was transitioned from 75 g to 100 g.  He is utilizing gabapentin 300 mg 3 times a day.  I was asked by Dr. Evlyn Courier to see if there were something that I might do to further alleviate the patient's pain, given a still continues to have substantial pain complaints when asked by nursing staff.   This morning, I woke the patient. I asked specifically about his pain overall, reported pain in the right neck and head, and reported pain in the right shoulder area.  His replies were essentially that he had something of a controlled  fall a few days ago, he thought, but other than some generalized soreness he was doing fairly well.  I asked him about the effectiveness of his medicine, and he reports that it seems to be doing fairly well.  I asked him if he had any additional complaints, and he answered in the negative Past Medical History  Diagnosis Date  . ETOH abuse   . Dental caries   . Cirrhosis of liver   . Hepatitis     c  . Arthritis     Past Surgical History  Procedure Laterality Date  . Application of cranial navigation N/A 06/03/2014    Procedure: APPLICATION OF CRANIAL NAVIGATION;  Surgeon: Consuella Lose, MD;  Location: [facility];  Service: Neurosurgery;  Laterality: N/A;  . Craniectomy  06/03/2014    Procedure: CRANIECTOMY POSTERIOR FOSSA DECOMPRESSION FOR TUMOR;  Surgeon: Consuella Lose, MD;  Location: [facility];  Service: Neurosurgery;;    History reviewed. No pertinent family history.  Social History:  reports that he quit smoking about 7 weeks ago. His smoking use included Cigarettes. He has quit using smokeless tobacco. He reports that he does not drink alcohol or use illicit drugs.  Allergies:  Allergies  Allergen Reactions  . Amoxicillin Rash  . Ibuprofen Other (See Comments)    Pt stated ibuprofen made him pass out  . Penicillins Nausea And Vomiting    And a rash    Medications: I have reviewed the patient's current medications.  Results for orders placed or performed during the hospital encounter of 05/20/14 (from the past  48 hour(s))  Glucose, capillary     Status: Abnormal   Collection Time: 06/15/14 11:16 AM  Result Value Ref Range   Glucose-Capillary 190 (H) 65 - 99 mg/dL   Comment 1 Notify RN    Comment 2 Document in Chart   Glucose, capillary     Status: Abnormal   Collection Time: 06/15/14  8:13 PM  Result Value Ref Range   Glucose-Capillary 238 (H) 65 - 99 mg/dL   Comment 1 Notify RN    Comment 2 Document in Chart   Glucose, capillary     Status: Abnormal    Collection Time: 06/15/14  8:39 PM  Result Value Ref Range   Glucose-Capillary 238 (H) 65 - 99 mg/dL  Glucose, capillary     Status: Abnormal   Collection Time: 06/16/14 12:16 AM  Result Value Ref Range   Glucose-Capillary 108 (H) 65 - 99 mg/dL   Comment 1 Notify RN    Comment 2 Document in Chart   Glucose, capillary     Status: Abnormal   Collection Time: 06/16/14  4:17 AM  Result Value Ref Range   Glucose-Capillary 123 (H) 65 - 99 mg/dL   Comment 1 Notify RN    Comment 2 Document in Chart   Glucose, capillary     Status: Abnormal   Collection Time: 06/16/14  8:11 AM  Result Value Ref Range   Glucose-Capillary 102 (H) 65 - 99 mg/dL  Glucose, capillary     Status: Abnormal   Collection Time: 06/16/14 12:12 PM  Result Value Ref Range   Glucose-Capillary 207 (H) 65 - 99 mg/dL    Current facility-administered medications:  .  0.9 %  sodium chloride infusion, , Intravenous, Continuous, Consuella Lose, MD, Last Rate: 50 mL/hr at 06/16/14 2207 .  antiseptic oral rinse (CPC / CETYLPYRIDINIUM CHLORIDE 0.05%) solution 7 mL, 7 mL, Mouth Rinse, BID, Consuella Lose, MD, 7 mL at 06/17/14 0959 .  bethanechol (URECHOLINE) tablet 10 mg, 10 mg, Oral, TID, Leeroy Cha, MD, 10 mg at 06/17/14 0955 .  bisacodyl (DULCOLAX) suppository 10 mg, 10 mg, Rectal, Daily PRN, Leeroy Cha, MD, 10 mg at 06/12/14 1451 .  dexamethasone (DECADRON) injection 8 mg, 8 mg, Intravenous, Q12H, Dory Horn, NP, 8 mg at 06/17/14 0956 .  diphenhydrAMINE (BENADRYL) injection 12.5 mg, 12.5 mg, Intravenous, Q6H PRN, 12.5 mg at 06/14/14 0319 **OR** diphenhydrAMINE (BENADRYL) 12.5 MG/5ML elixir 12.5 mg, 12.5 mg, Oral, Q6H PRN, Brand Males, MD .  fentaNYL (DURAGESIC - dosed mcg/hr) patch 100 mcg, 100 mcg, Transdermal, Q72H, Dory Horn, NP, 100 mcg at 06/15/14 1703 .  folic acid (FOLVITE) tablet 1 mg, 1 mg, Oral, Daily, Deneise Lever, MD, 1 mg at 06/17/14 0955 .  gabapentin (NEURONTIN) capsule 400  mg, 400 mg, Oral, TID, Dory Horn, NP, 400 mg at 06/17/14 0955 .  hydrALAZINE (APRESOLINE) injection 10 mg, 10 mg, Intravenous, Q6H PRN, Maryann Mikhail, DO, 10 mg at 06/03/14 2016 .  HYDROmorphone (DILAUDID) injection 1 mg, 1 mg, Intravenous, Q2H PRN, Dory Horn, NP, 1 mg at 06/17/14 0959 .  insulin glargine (LANTUS) injection 20 Units, 20 Units, Subcutaneous, Daily, Consuella Lose, MD, 20 Units at 06/17/14 1001 .  labetalol (NORMODYNE,TRANDATE) injection 10-40 mg, 10-40 mg, Intravenous, Q10 min PRN, Consuella Lose, MD, 40 mg at 06/04/14 0006 .  LORazepam (ATIVAN) tablet 1 mg, 1 mg, Oral, QHS, Consuella Lose, MD, 1 mg at 06/16/14 2208 .  meclizine (ANTIVERT) tablet 25 mg, 25 mg, Oral, TID  PRN, Cristal Ford, DO .  multivitamin with minerals tablet 1 tablet, 1 tablet, Oral, Daily, Deneise Lever, MD, 1 tablet at 06/17/14 0955 .  naloxone [facility]) injection 0.4 mg, 0.4 mg, Intravenous, PRN **AND** sodium chloride 0.9 % injection 9 mL, 9 mL, Intravenous, PRN, Brand Males, MD .  ondansetron (ZOFRAN) injection 4 mg, 4 mg, Intravenous, Q6H PRN, Brand Males, MD .  oxymetazoline (AFRIN) 0.05 % nasal spray 2 spray, 2 spray, Right Nare, Once, Dianne Dun, NP, 2 spray at 06/01/14 2130 .  polyethylene glycol (MIRALAX / GLYCOLAX) packet 17 g, 17 g, Oral, Daily PRN, Dory Horn, NP .  promethazine (PHENERGAN) injection 25 mg, 25 mg, Intravenous, Q6H PRN, Maryann Mikhail, DO, 12.5 mg at 06/03/14 0050 .  promethazine (PHENERGAN) tablet 12.5-25 mg, 12.5-25 mg, Oral, Q4H PRN, Consuella Lose, MD .  saccharomyces boulardii (FLORASTOR) capsule 250 mg, 250 mg, Oral, BID, Rise Patience, MD, 250 mg at 06/17/14 0955 .  senna-docusate (Senokot-S) tablet 1 tablet, 1 tablet, Oral, QHS, Dory Horn, NP, 1 tablet at 06/16/14 2208 .  tamsulosin (FLOMAX) capsule 0.4 mg, 0.4 mg, Oral, QPC supper, Dory Horn, NP, 0.4 mg at 06/16/14 1726 .   thiamine (VITAMIN B-1) tablet 100 mg, 100 mg, Oral, Daily, 100 mg at 06/17/14 0955 **OR** [DISCONTINUED] thiamine (B-1) injection 100 mg, 100 mg, Intravenous, Daily, Deneise Lever, MD    No results found.  Review of Systems  Constitutional: Negative.   HENT: Negative for congestion, ear discharge, ear pain, hearing loss, nosebleeds, sore throat and tinnitus.   Eyes: Negative.   Respiratory: Negative.  Negative for stridor.   Cardiovascular: Negative.   Gastrointestinal: Positive for nausea.  Skin: Negative.   Neurological: Positive for headaches.  Endo/Heme/Allergies: Negative.   Psychiatric/Behavioral: Negative.    Blood pressure 126/89, pulse 89, temperature 97.7 F (36.5 C), temperature source Oral, resp. rate 20, height 5' 9.6" (1.768 m), weight 92.8 kg (204 lb 9.4 oz), SpO2 96 %. Physical Exam  Constitutional: He appears well-developed and well-nourished.  HENT:  Head: Normocephalic.  Well healed occipital incision  Eyes: EOM are normal. Pupils are equal, round, and reactive to light.  Cardiovascular: Normal rate.   Respiratory: Effort normal.  Neurological: He is alert.  Skin: Skin is warm and dry.  Psychiatric: He has a normal mood and affect.    Assessment/Plan: Metastatic lung cancer  PLAN: As I interview and examine the patient today, he seems to be doing fairly well.It does seem that the modifications his care team have made in his medications are appropriate and effective. He has a disposition planning meeting with his family, and his medical team later today.  I remain available at any time to see this gentleman, and assist in any way that I can  Bonna Gains 06/17/2014, 11:12 AM

## 2014-06-17 NOTE — Progress Notes (Signed)
Met with patient and wife to discuss discharge planning.  [name] has already been in contact with Candie Echevaria of [facility] [phone].  CM spoke with Ms Jory Ee to verify that patient will be discharging home with their services.  Requested orders and clinicals were faxed to [phone].  CM will continue to follow until patient is discharged.

## 2014-06-17 NOTE — Care Management Note (Signed)
Case Management Note  Patient Details  Name: Ray Garcia MRN: [id number] Date of Birth: March 29, 1960  Subjective/Objective:                    Action/Plan:   Expected Discharge Date:                  Expected Discharge Plan:  [place]  In-House Referral:  Clinical Social Work  Discharge planning Services  CM Consult  Post Acute Care Choice:    Choice offered to:     DME Arranged:    DME Agency:     HH Arranged:    [place] Agency:     Status of Service:  In process, will continue to follow  Medicare Important Message Given:    Date Medicare IM Given:    Medicare IM give by:    Date Additional Medicare IM Given:    Additional Medicare Important Message give by:     If discussed at [place] of Stay Meetings, dates discussed:  06/17/14  Additional Comments:  Delrae Sawyers, RN 06/17/2014, 3:07 PM

## 2014-06-17 NOTE — Progress Notes (Signed)
Physical Therapy Treatment Patient Details Name: Ray Garcia MRN: [id number] DOB: 01-12-1961 Today's Date: 06/17/2014    History of Present Illness This is a 54 y.o. year old male with significant past medical history of cirrhosis, hepatitis C, HTN, dental caries/abscess, ETOH abuse presenting with cerebellar mass. Pt underwent biopsy and has now been to sx for excision of good portion of the tumor.  Palliative Care has started pain management techniques that seem to be helping.    PT Comments    Patient demonstrates improvements in functional mobility and activity tolerance today. Patient was able to ambulate in hall with assist. Patient remains ataxic with poor truncal control during gait but improved recognition of instability and need for rest breaks to resent positioning. Patient also demonstrates significant improvement in affect, more engaged, with very positive attitude during session stating "i have a lot of living left to do". Will continue to see and progress as tolerated.  Follow Up Recommendations  Supervision/Assistance - 24 hour     Equipment Recommendations  Rolling walker with 5" wheels;Wheelchair (measurements PT);Hospital bed    Recommendations for Other Services       Precautions / Restrictions Precautions Precautions: Fall Restrictions Weight Bearing Restrictions: No    Mobility  Bed Mobility Overal bed mobility: Needs Assistance Bed Mobility: Supine to Sit;Sit to Supine     Supine to sit: Mod assist;HOB elevated Sit to supine: Min assist   General bed mobility comments: Assist to come to EOB for truncal support, assist to return and reposition in bed  Transfers Overall transfer level: Needs assistance Equipment used: Rolling walker (2 wheeled) Transfers: Sit to/from Stand Sit to Stand: Mod assist;+2 physical assistance         General transfer comment: initially attempted from lower bed height with pt unable to fully powerup to standing. Second  attempt bed height elevated and pt able to smoothly complete sit>stand with mod +2 assist for balance and power up  Ambulation/Gait Ambulation/Gait assistance: Mod assist Ambulation Distance (Feet): 60 Feet Assistive device: Rolling walker (2 wheeled) Gait Pattern/deviations: Step-through pattern;Narrow base of support;Ataxic Gait velocity: decreased Gait velocity interpretation: <1.8 ft/sec, indicative of risk for recurrent falls General Gait Details: does not wish to ambulate at this time secondary to pain and arrival of lunch   Stairs            Wheelchair Mobility    Modified Rankin (Stroke Patients Only)       Balance Overall balance assessment: Needs assistance Sitting-balance support: Feet supported Sitting balance-Leahy Scale: Fair Sitting balance - Comments: washed face sitting EOB with no LOB, single extremity support   Standing balance support: Bilateral upper extremity supported Standing balance-Leahy Scale: Poor Standing balance comment: c/o dizziness in standing <1 minute and requesting to sit. 1 LOB posteriorly with pt able to correct with mod A.                     Cognition Arousal/Alertness: Awake/alert Behavior During Therapy: WFL for tasks assessed/performed Overall Cognitive Status: Impaired/Different from baseline Area of Impairment: Attention;Following commands;Safety/judgement;Problem solving   Current Attention Level: Sustained   Following Commands: Follows one step commands consistently;Follows one step commands with increased time     Problem Solving: Slow processing General Comments: Patient appears more motivated today    Exercises      General Comments        Pertinent Vitals/Pain Pain Assessment: Faces Faces Pain Scale: Hurts even more Pain Location: neck and back region Pain Descriptors /  Indicators: Aching Pain Intervention(s): Monitored during session;Limited activity within patient's tolerance;Premedicated before  session;Repositioned    Home Living                      Prior Function            PT Goals (current goals can now be found in the care plan section) Acute Rehab PT Goals Patient Stated Goal: to get better PT Goal Formulation: With patient Time For Goal Achievement: 06/19/14 Potential to Achieve Goals: Good Progress towards PT goals: Progressing toward goals    Frequency  Min 3X/week    PT Plan Current plan remains appropriate    Co-evaluation             End of Session Equipment Utilized During Treatment: Gait belt Activity Tolerance: Patient tolerated treatment well;Patient limited by fatigue Patient left: in bed;with call bell/phone within reach;with bed alarm set;with nursing/sitter in room (nsg tech to provide supervision for lunch)     Time: 1655-3748 PT Time Calculation (min) (ACUTE ONLY): 19 min  Charges:  $Gait Training: 8-22 mins                    G CodesDuncan Dull 2014/07/08, 3:37 PM Alben Deeds, [place] DPT  [phone]

## 2014-06-17 NOTE — Progress Notes (Signed)
Follow up - goals of care, pain management, disposition  Interval:  No new problems     Patient seen this AM by Dr. Lovenia Shuck for possible interventional pain management - pt denied marked pain, no intervention         Indicated  Subjective: On questioning patient rates neck pain at 6/10 that is constant. He still has shoulder pain. He reports he has been up with PT and OT  Exam: Filed Vitals:   06/17/14 1327  BP: 129/89  Pulse: 73  Temp: 97.7 F (36.5 C)  Resp: 16   Wt Readings from Last 3 Encounters:  05/20/14 92.8 kg (204 lb 9.4 oz)   Gen'l - chronically ill appearing white man who is somnolent vs very flat affect. HEENT - surgical site not examined Cor - RRR Pul - no increased WOB Neuro - mumbles but speech is intelligible. Follows commands. MAE to command  1. Oncology - significant tumor burden. Reviewed path: EGF not done on specimen. Discussed terminal nature of disease and short trajectory if EGF negative.  2. Pain management - rates pain at continuous 6/10. Has been on 100 mcg fentanyl patch starting Sunday. He is on Gabapentin 400 mg TID. He is requiring 3-4 1 mg IV doses of dilaudid per day.     Plan - no change in regimen. Consider increasing fentanyl to 125 mcg ( 100 mcg + 25 mcg patch) 5/25  3. Disposition - patient wants to go home. Discussed options with patient and his wife: he is a good candidate for home with hospice. Home PT and OT will be part of his palliative care plan. He can see oncology to explore possible options but it was made clear that under-taking aggressive treatment may require that he rescind his hospice status. He lives in [place] and is advised that if he wants to consult oncology about treatment options working with [facility] is more practical.  MOST form was discussed and executed - copy to shadow chart: DNR/DNI, no ICU care, no tube feeding. OK for medical treatment limited, IV fluids for a defined period of time.   Plan - home  with hospice as soon as Neuro-surgery feels he can be discharged. He will need Rx for pain meds: fentanyl transdermal and OxyIR for break through.   Plan fully discussed with patient, patient's wife, CM.  Greater than 50% of encounter spent on education and counseling. Time in  1200 - time out  [place] Team [phone]

## 2014-06-17 NOTE — Progress Notes (Signed)
Occupational Therapy Treatment Patient Details Name: Ray Garcia MRN: [id number] DOB: [birth year] Today's Date: 06/17/2014    History of present illness This is a 54 y.o. year old male with significant past medical history of cirrhosis, hepatitis C, HTN, dental caries/abscess, ETOH abuse presenting with cerebellar mass. Pt underwent biopsy and has now been to sx for excision of good portion of the tumor.  Palliative Care has started pain management techniques that seem to be helping.   OT comments  Pt progressing somewhat towards acute OT goals. Pt completed ADLs as detailed below. No family present for session. OT to continue to follow.   Follow Up Recommendations  Supervision/Assistance - 24 hour;Home health OT    Equipment Recommendations  3 in 1 bedside comode;Tub/shower bench;Wheelchair cushion (measurements OT);Wheelchair (measurements OT);Hospital bed    Recommendations for Other Services      Precautions / Restrictions Precautions Precautions: Fall Restrictions Weight Bearing Restrictions: No       Mobility Bed Mobility Overal bed mobility: Needs Assistance Bed Mobility: Supine to Sit;Sit to Supine     Supine to sit: Mod assist;HOB elevated Sit to supine: Min assist   General bed mobility comments: mod A to advance trunk  Transfers Overall transfer level: Needs assistance Equipment used: Rolling walker (2 wheeled) Transfers: Sit to/from Stand Sit to Stand: Mod assist;+2 physical assistance         General transfer comment: initially attempted from lower bed height with pt unable to fully powerup to standing. Second attempt bed height elevated and pt able to smoothly complete sit>stand with mod +2 assist for balance and power up    Balance Overall balance assessment: Needs assistance Sitting-balance support: Feet supported;Single extremity supported Sitting balance-Leahy Scale: Fair Sitting balance - Comments: washed face sitting EOB with no LOB, single  extremity support   Standing balance support: Bilateral upper extremity supported Standing balance-Leahy Scale: Poor Standing balance comment: c/o dizziness in standing <1 minute and requesting to sit. 1 LOB posteriorly with pt able to correct with mod A.                    ADL Overall ADL's : Needs assistance/impaired     Grooming: Wash/dry face;Min guard;Sitting Grooming Details (indicate cue type and reason): washed face while sitting EOB                               General ADL Comments: Pt completed supine>EOB and sit<>stand EOB. Pt cued for techniques to facilitate assisting with mobility. Pt cued for strategies to compensate for trunkal ataxia. Pt stood for <1 minute c/o dizziness and requesting to sit. No family present for session. Enocuraged pt engagement within pain tolerance.      Vision                     Perception     Praxis      Cognition   Behavior During Therapy: Flat affect Overall Cognitive Status: Impaired/Different from baseline Area of Impairment: Attention;Following commands;Safety/judgement;Problem solving   Current Attention Level: Sustained    Following Commands: Follows one step commands consistently;Follows one step commands with increased time     Problem Solving: Slow processing      Extremity/Trunk Assessment               Exercises     Shoulder Instructions       General Comments      Pertinent  Vitals/ Pain       Pain Assessment: Faces Faces Pain Scale: Hurts little more Pain Location: back of head Pain Descriptors / Indicators: Aching Pain Intervention(s): Monitored during session;Limited activity within patient's tolerance;Premedicated before session;Repositioned  Home Living                                          Prior Functioning/Environment              Frequency Min 2X/week     Progress Toward Goals  OT Goals(current goals can now be found in the care  plan section)  Progress towards OT goals: Progressing toward goals  Acute Rehab OT Goals Patient Stated Goal: to get better OT Goal Formulation: With patient/family Time For Goal Achievement: 06/20/14 Potential to Achieve Goals: Good ADL Goals Pt Will Perform Grooming: with supervision;sitting Pt Will Perform Upper Body Bathing: with supervision;sitting Pt Will Perform Upper Body Dressing: with supervision;sitting Pt Will Transfer to Toilet: with min assist;bedside commode;ambulating Pt Will Perform Toileting - Clothing Manipulation and hygiene: with min assist;sit to/from stand Additional ADL Goal #1: Pt will demonstrate emergent awreness of complesatory techniques for trunkal ataxia during ADL with min vc  Plan Discharge plan needs to be updated;Other (comment) (Family plans home with palliative care)    Co-evaluation                 End of Session Equipment Utilized During Treatment: Gait belt;Oxygen;Other (comment) (O2 off upon therpist arrival, donned at end of session)   Activity Tolerance Patient limited by pain;Other (comment) (dizziness in standing)   Patient Left in bed;with call bell/phone within reach;with bed alarm set;with SCD's reapplied   Nurse Communication          Time: 8867-7373 OT Time Calculation (min): 21 min  Charges: OT General Charges $OT Visit: 1 Procedure OT Treatments $Self Care/Home Management : 8-22 mins  Hortencia Pilar 06/17/2014, 1:26 PM

## 2014-06-18 MED ORDER — DEXAMETHASONE 4 MG PO TABS
8.0000 mg | ORAL_TABLET | Freq: Two times a day (BID) | ORAL | Status: DC
  Administered 2014-06-18: 8 mg via ORAL
  Filled 2014-06-18: qty 2

## 2014-06-18 MED ORDER — POLYETHYLENE GLYCOL 3350 17 G PO PACK: 17.0000 g | PACK | Freq: Every day | ORAL | Status: AC | PRN

## 2014-06-18 MED ORDER — DEXAMETHASONE 1 MG PO TABS: 1.0000 mg | ORAL_TABLET | Freq: Two times a day (BID) | ORAL | Status: AC

## 2014-06-18 MED ORDER — MECLIZINE HCL 25 MG PO TABS: 25.0000 mg | ORAL_TABLET | Freq: Three times a day (TID) | ORAL | Status: AC | PRN

## 2014-06-18 MED ORDER — GABAPENTIN 400 MG PO CAPS: 400.0000 mg | ORAL_CAPSULE | Freq: Three times a day (TID) | ORAL | Status: AC

## 2014-06-18 MED ORDER — HYDROMORPHONE HCL 4 MG PO TABS: 4.0000 mg | ORAL_TABLET | ORAL | Status: AC | PRN

## 2014-06-18 MED ORDER — DEXAMETHASONE 4 MG PO TABS: 4.0000 mg | ORAL_TABLET | Freq: Three times a day (TID) | ORAL | Status: AC

## 2014-06-18 MED ORDER — ONDANSETRON HCL 4 MG/2ML IJ SOLN: 4.0000 mg | Freq: Four times a day (QID) | INTRAMUSCULAR | Status: AC | PRN

## 2014-06-18 MED ORDER — DEXAMETHASONE 2 MG PO TABS: 2.0000 mg | ORAL_TABLET | Freq: Three times a day (TID) | ORAL | Status: AC

## 2014-06-18 MED ORDER — FENTANYL 100 MCG/HR TD PT72: 100.0000 ug | MEDICATED_PATCH | TRANSDERMAL | Status: AC

## 2014-06-18 MED ORDER — BETHANECHOL CHLORIDE 10 MG PO TABS: 10.0000 mg | ORAL_TABLET | Freq: Three times a day (TID) | ORAL | Status: AC

## 2014-06-18 MED ORDER — TAMSULOSIN HCL 0.4 MG PO CAPS: 0.4000 mg | ORAL_CAPSULE | Freq: Every day | ORAL | Status: AC

## 2014-06-18 MED ORDER — HYDROMORPHONE HCL 2 MG PO TABS
4.0000 mg | ORAL_TABLET | ORAL | Status: DC | PRN
  Administered 2014-06-18 (×2): 4 mg via ORAL
  Administered 2014-06-18: 6 mg via ORAL
  Administered 2014-06-18: 4 mg via ORAL
  Filled 2014-06-18 (×3): qty 2
  Filled 2014-06-18: qty 3

## 2014-06-18 NOTE — Discharge Summary (Signed)
Physician Discharge Summary  Patient ID: Ray Garcia MRN: [id number] DOB/AGE: [birth year] 54 y.o.  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 Olly Shiner is a 54 y.o. male initially admitted with severe nausea and imbalance. Workup included CT which demonstrated a left cerebellar tumor, and a lung mass. Biopsy confirmed lung non-small cell cancer. He underwent craniectomy for resection and was initially monitored in the ICU. He was at neurologic baseline with the exception of dysphagia leading to an aspiration pneumonia. This was treated with levaquin. He was placed on dysphagia diet. He also had severe postoperative pain ultimately requiring a dilaudid drip and conversion to fentanyl patch with intermittent dilaudid pushes. After consultation with the palliative care service, he and his wife elected to be discharged home with home hospice.  Treatments: Surgery - Suboccipital craniectomy for resection of tumor  Discharge Exam: Blood pressure 127/76, pulse 89, temperature 97 F (36.1 C), temperature source Oral, resp. rate 20, height 5' 9.6" (1.768 m), weight 92.8 kg (204 lb 9.4 oz), SpO2 95 %. Awake, alert, oriented Speech fluent, appropriate CN grossly intact 5/5 BUE/BLE Wound c/d/i  Follow-up: Follow-up in my office [facility] [phone]) in 3-4 weeks  Disposition: Home with home hospice     Medication List    STOP taking these  medications        dexamethasone 4 MG/ML injection  Commonly known as:  DECADRON      TAKE these medications        bethanechol 10 MG tablet  Commonly known as:  URECHOLINE  Take 1 tablet (10 mg total) by mouth 3 (three) times daily.     dexamethasone 4 MG tablet  Commonly known as:  DECADRON  Take 1 tablet (4 mg total) by mouth 3 (three) times daily.     dexamethasone 2 MG tablet  Commonly known as:  DECADRON  Take 1 tablet (2 mg total) by mouth 3 (three) times daily.  Start taking on:  06/21/2014     dexamethasone 1 MG tablet  Commonly known as:  DECADRON  Take 1 tablet (1 mg total) by mouth 2 (two) times daily with a meal.  Start taking on:  06/24/2014     fentaNYL 100 MCG/HR  Commonly known as:  DURAGESIC - dosed mcg/hr  Place 1 patch (100 mcg total) onto the skin every 3 (three) days.     folic acid 1 MG tablet  Commonly known as:  FOLVITE  Take 1 mg by mouth daily.     gabapentin 400 MG capsule  Commonly known as:  NEURONTIN  Take 1 capsule (400 mg total) by mouth 3 (three) times daily.     heparin 5000 UNIT/ML injection  Inject 5,000 Units into the skin 3 (three) times daily.     HYDROmorphone 4 MG tablet  Commonly known as:  DILAUDID  Take 1-1.5 tablets (4-6 mg total) by mouth every 2 (two) hours as needed  for severe pain.     meclizine 25 MG tablet  Commonly known as:  ANTIVERT  Take 1 tablet (25 mg total) by mouth 3 (three) times daily as needed for dizziness.     multivitamin with minerals tablet  Take 1 tablet by mouth daily.     ondansetron 4 MG/2ML Soln injection  Commonly known as:  ZOFRAN  Inject 2 mLs (4 mg total) into the vein every 6 (six) hours as needed for nausea or vomiting.     penicillin v potassium 500 MG tablet  Commonly known as:  VEETID  Take 500 mg by mouth 4 (four) times daily.     polyethylene glycol packet  Commonly known as:  MIRALAX / GLYCOLAX  Take 17 g by mouth daily as needed (constipation).     tamsulosin 0.4 MG Caps  capsule  Commonly known as:  FLOMAX  Take 1 capsule (0.4 mg total) by mouth daily after supper.     thiamine 100 MG tablet  Take 100 mg by mouth daily.       Follow-up Information    Follow up with [facility], Jeannine Pennisi, C, MD In 3 weeks.   Specialty:  Neurosurgery   Contact information:   [address] [place] [address] [phone]       Call [facility].   Specialty:  Oncology   Contact information:   [place] [id number] [place] [phone]      Signed: Jairo Ben 06/18/2014, 6:23 PM

## 2014-06-18 NOTE — Progress Notes (Signed)
Spoke with patient's wife, who states that DME has arrived and she is ready for patient to be transported whenever the physician gives the clearance for discharge.  Message was left with Dr Cleotilde Neer secretary inquiring whether discharge would be today or tomorrow.  Awaiting return call. CM updated bedside RN and asked that she contact patient's wife once Dr Kathyrn Sheriff has decided about discharge.

## 2014-06-18 NOTE — Progress Notes (Signed)
   Sarben with patient's wife via phone regarding discharge.  Per wife, patient's DME is being delivered sometime after 1300 today.  [name] will call CM as soon as equipment has been delivered so that transportation for patient can be arranged.  CSW was updated.

## 2014-06-18 NOTE — Progress Notes (Signed)
Patient discharged home. Transported home with [facility]. Neuro assessment unchanged. Packet including discharge instructions and prescriptions given to transportation.

## 2014-06-18 NOTE — Progress Notes (Addendum)
[name] called and aware of patient's discharge tonight. Transportation set up with [facility]. Patient aware of discharge.   Social work aware of patient's discharge, to bring ambulance transportation sheet.

## 2014-06-18 NOTE — Progress Notes (Signed)
Patient Ray Garcia      DOB: January 19, 1961      CMK:[id number]   Palliative Medicine Team at [facility] Progress Note    Subjective: Pain feels a little worse today. Not noting benefit from oral dilaudid pills. No nausea/vomiting.    Filed Vitals:   06/18/14 0938  BP: 115/92  Pulse: 73  Temp: 97.4 F (36.3 C)  Resp: 20   Physical exam: GEN: alert, NAD CV: RRR ABD: soft, ND EXT: warm  CBC    Component Value Date/Time   WBC 15.8* 06/13/2014 0220   RBC 4.67 06/13/2014 0220   HGB 14.9 06/13/2014 0220   HCT 40.9 06/13/2014 0220   PLT 188 06/13/2014 0220   MCV 87.6 06/13/2014 0220   MCH 31.9 06/13/2014 0220   MCHC 36.4* 06/13/2014 0220   RDW 12.9 06/13/2014 0220   LYMPHSABS 1.7 06/13/2014 0220   MONOABS 0.8 06/13/2014 0220   EOSABS 0.0 06/13/2014 0220   BASOSABS 0.2* 06/13/2014 0220    CMP     Component Value Date/Time   NA 131* 06/13/2014 0220   K 4.5 06/13/2014 0220   CL 94* 06/13/2014 0220   CO2 27 06/13/2014 0220   GLUCOSE 142* 06/13/2014 0220   BUN 19 06/13/2014 0220   CREATININE 0.49* 06/13/2014 0220   CALCIUM 8.9 06/13/2014 0220   PROT 7.4 05/25/2014 0805   ALBUMIN 3.5 05/25/2014 0805   AST 56* 05/25/2014 0805   ALT 132* 05/25/2014 0805   ALKPHOS 60 05/25/2014 0805   BILITOT 1.2 05/25/2014 0805   GFRNONAA >60 06/13/2014 0220   GFRAA >60 06/13/2014 0220     Assessment and plan: 54 yo male with metastatic lung adenoCA   1. Code Status- DNR  2. [place] - plan for home with hospice today with community hospice. Has Onc follow-up in June.    3. Symptom Management  Cancer Related Pain- On fentanyl 146mg/hr patch for few days.  Pain a little worse today. Not used IV dilaudid but oral dilauid not helpful. I think this is because his oral dose is significant smaller than his IV.  I will increase PO dilaudid to 4-'6mg'$  PRN which will get a bit closer to his IV equivalent.  I think it would be fine to d.c him home today with hospice as planned. Continue  gabapentin, steroids. He is on bowel regimen.    ADoran ClayD.O. [facility] Pager: [phone] Phone: [phone]

## 2014-06-18 NOTE — Progress Notes (Signed)
Speech Language Pathology Treatment: Dysphagia  Patient Details Name: Ray Garcia MRN: [id number] DOB: [birth year] Today's Date: 06/18/2014 Time: 3795-5831 SLP Time Calculation (min) (ACUTE ONLY): 10 min  Assessment / Plan / Recommendation Clinical Impression  F/u today for education: D/w pt source for purchasing thickener; maintaining purees/pudding-thick liquids if those consistencies allow him the greatest comfort when eating (those consistencies were determined to be safest/minimize aspiration per instrumental testing), but that if Ray Garcia derives pleasure from other foods, he should certainly eat/drink per his preferences. Pt D/Cing home with hospice today - no further SLP f/u is warranted.     HPI Other Pertinent Information: This is a 54 y.o. year old male with PMH of cirrhosis, hepatitis C, HTN, dental caries/abscess, ETOH abuse, presenting with cerebellar mass. Pt underwent biopsy and is s/p craniectomy for partial resection of tumor. Follow up MRI 5/10 shows that the superior portion of the tumor remains, with extension into the left posterior lateral medulla unchanges. Swallow evaluation requested after RN noted difficulty swallowing at bedside, including coughing and expectoration of eggs from breakfast meal.   Pertinent Vitals    SLP Plan  All goals met    Recommendations Diet recommendations: Dysphagia 1 (puree);Pudding-thick liquid Liquids provided via: Teaspoon Medication Administration: Crushed with puree              Oral Care Recommendations: Oral care BID Follow up Recommendations:  (no SLP f/u) Plan: All goals met  Ray Garcia, [place] CCC/SLP Pager [phone]      Ray Garcia 06/18/2014, 2:22 PM

## 2014-06-30 ENCOUNTER — Telehealth: Payer: Self-pay | Admitting: *Deleted

## 2014-06-30 ENCOUNTER — Telehealth: Payer: Self-pay | Admitting: Hematology and Oncology

## 2014-06-30 ENCOUNTER — Ambulatory Visit: Payer: Self-pay | Admitting: Hematology and Oncology

## 2014-06-30 ENCOUNTER — Other Ambulatory Visit: Payer: Self-pay

## 2014-06-30 ENCOUNTER — Ambulatory Visit: Payer: Self-pay

## 2014-06-30 NOTE — Telephone Encounter (Signed)
Wife states she will try to bring patient here Wednesday at 1300, Dr Alvy Bimler [phone]. No labs Will call us back to confirm.

## 2014-06-30 NOTE — Telephone Encounter (Signed)
Wife states he does have Hospice at home, but wants to know if there is treatment they can do. They would stop Hospice if he can be treated to prolong life a little longer... Was not aware of appt. Live in [place]. Would like to come for appt.

## 2014-06-30 NOTE — Telephone Encounter (Signed)
I can see him at 145 pm Wednesday No need labs New patient to [facility]; he needs to check in early at 1 pm Please schedule

## 2014-06-30 NOTE — Telephone Encounter (Signed)
Sent msg to MD/AM to override MD visit for 06/08 '@1'$ :45 pt aware per 06/06 POF..... KJ

## 2014-07-02 ENCOUNTER — Ambulatory Visit: Payer: Self-pay | Admitting: Hematology and Oncology

## 2014-07-14 ENCOUNTER — Ambulatory Visit: Payer: Self-pay | Admitting: Radiation Oncology

## 2014-07-14 ENCOUNTER — Telehealth: Payer: Self-pay | Admitting: Radiation Oncology

## 2014-07-14 ENCOUNTER — Ambulatory Visit
Admission: RE | Admit: 2014-07-14 | Discharge: 2014-07-14 | Disposition: A | Discharge status: Home / Self Care | Payer: Medicaid Other | Source: Ambulatory Visit | Attending: Radiation Oncology | Admitting: Radiation Oncology

## 2014-07-14 NOTE — Telephone Encounter (Signed)
Phoned patient's home. Spoke with the patient's wife. Explained her husband had a follow up appointment with Dr. Tammi Klippel. She reports he husband expired on Thursday. Express my condolences.

## 2014-07-25 DEATH — deceased

## 2016-07-31 IMAGING — MR MR HEAD WO/W CM
11 of 13 series · 33 of 48 positions shown · IV contrast (Yes   MULTIHANCE)
Comparison: Head CT from [facility] yesterday at 8287 hours

CLINICAL DATA: Dizziness with nausea and vomiting. Known brain
mass.

EXAM:
MRI HEAD WITHOUT AND WITH CONTRAST
TECHNIQUE: Multiplanar, multiecho pulse sequences of the brain and surrounding
structures were obtained without and with intravenous contrast.
CONTRAST:  20mL MULTIHANCE GADOBENATE DIMEGLUMINE 529 MG/ML IV SOLN

[Series 3: T1 · sagittal · 5.0mm · 0.47mm/px · 1 of 23 slices shown]
[im 1/23]
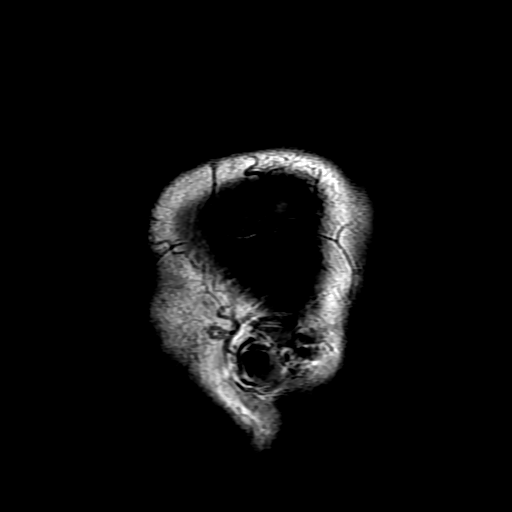

[Series 4: DWI · axial · 3.0mm · 1.09mm/px · z∈[-92,+51]mm · 8 of 98 slices shown (1 of 4)]
[im 1/98]
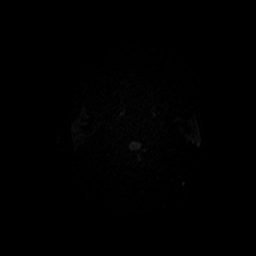
[im 14/98]
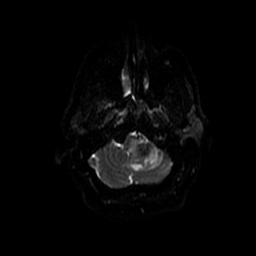
[im 28/98]
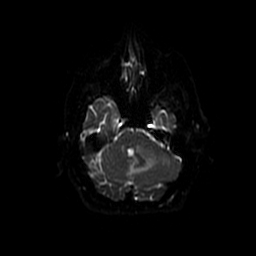
[im 42/98]
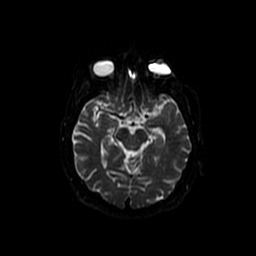
[im 56/98]
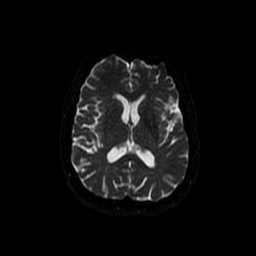
[im 70/98]
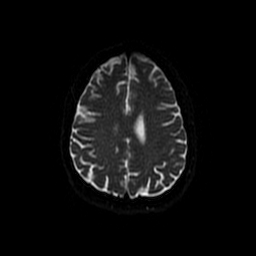
[im 84/98]
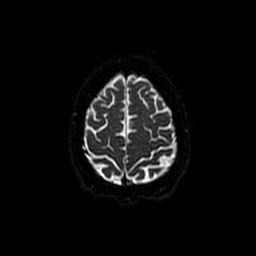
[im 98/98]
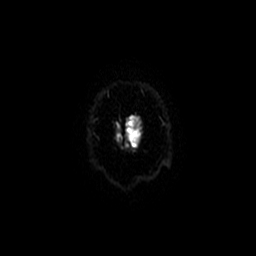

[Series 5: T2 · axial · 5.0mm · 0.43mm/px · z∈[-98,+52]mm · 2 of 26 slices shown]
[im 1/26]
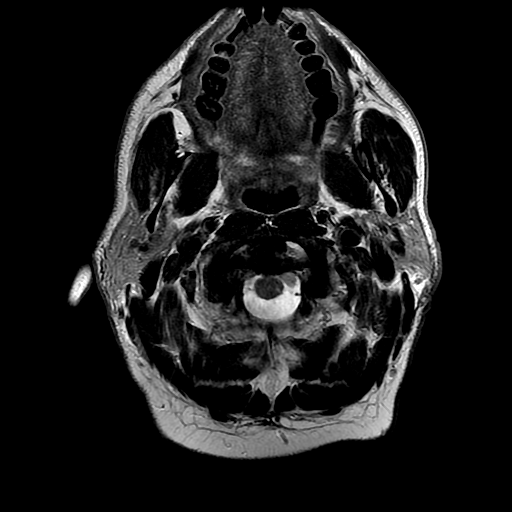
[im 26/26]
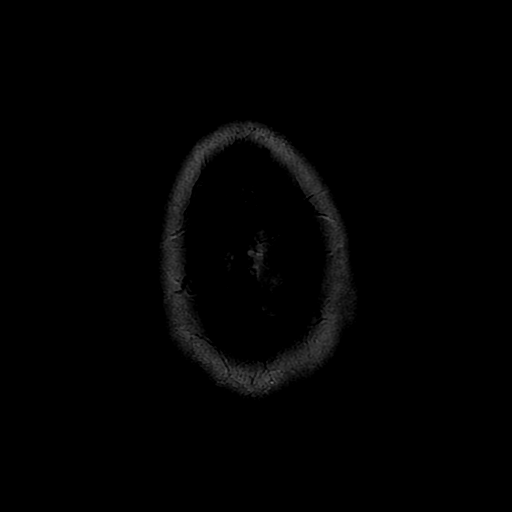

[Series 6: DWI · coronal · 5.0mm · 1.09mm/px · 6 of 66 slices shown (2 of 4)]
[im 1/66]
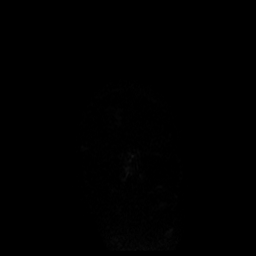
[im 14/66]
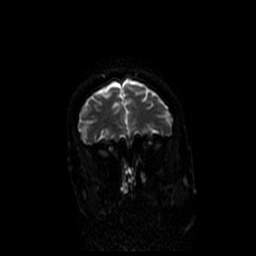
[im 27/66]
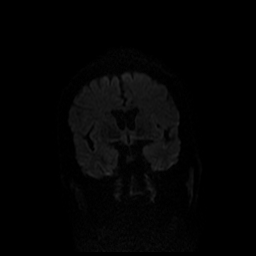
[im 40/66]
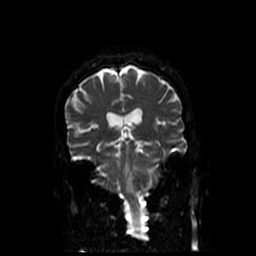
[im 53/66]
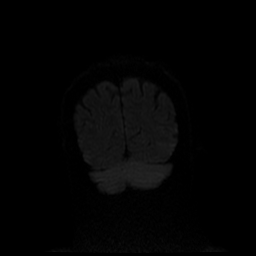
[im 66/66]
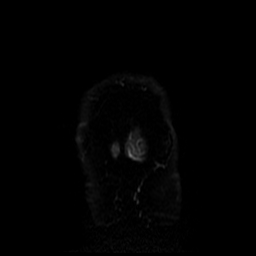

[Series 7: FLAIR · axial · 5.0mm · 0.43mm/px · z∈[-98,+52]mm · 2 of 26 slices shown]
[im 1/26]
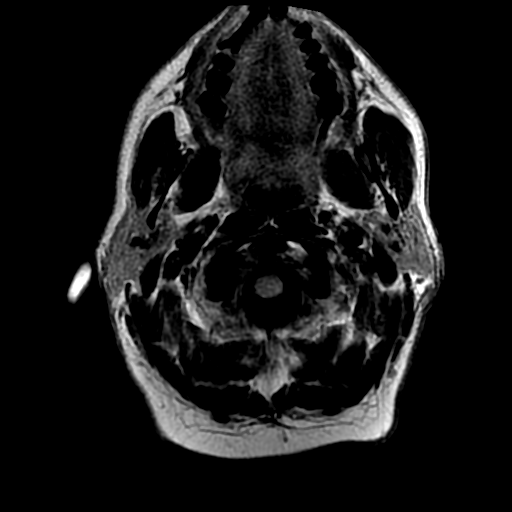
[im 26/26]
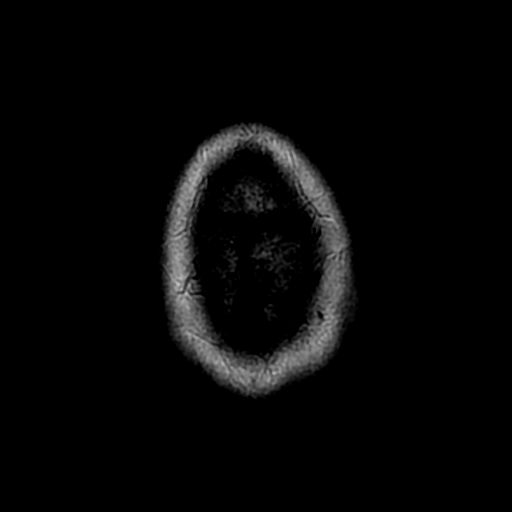

[Series 8: ax mpgr · axial · 5.0mm · 0.43mm/px · 1 of 23 slices shown]
[im 1/23]
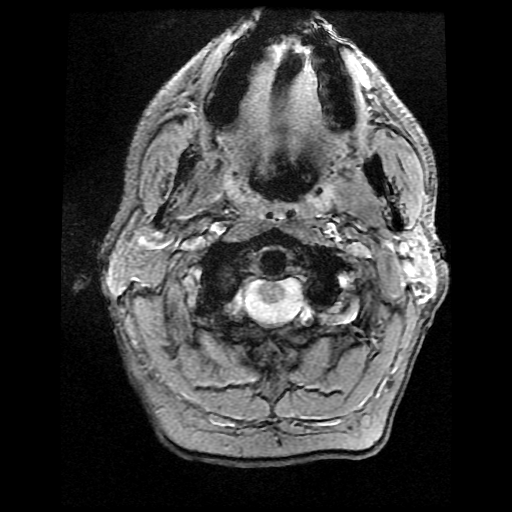

[Series 11: T2 post-contrast · coronal · 5.0mm · 0.39mm/px · 2 of 25 slices shown]
[im 1/25]
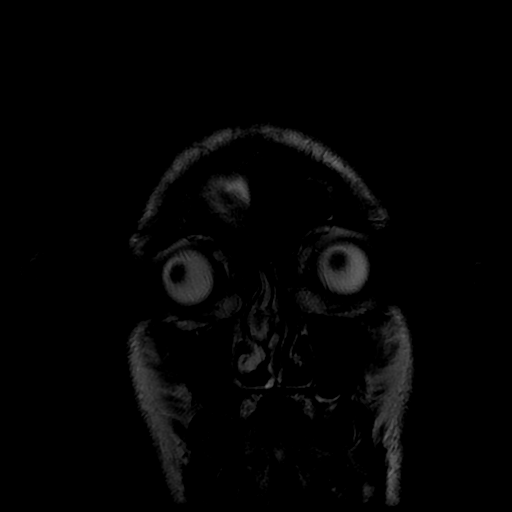
[im 25/25]
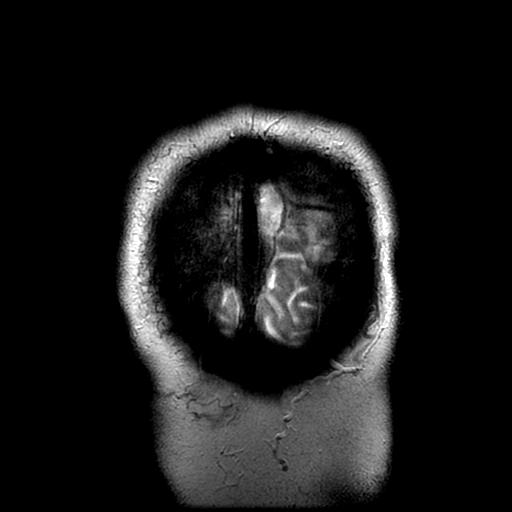

[Series 14: T1 post-contrast · coronal · 5.0mm · 0.45mm/px · 2 of 25 slices shown (1 of 2)]
[im 1/25]
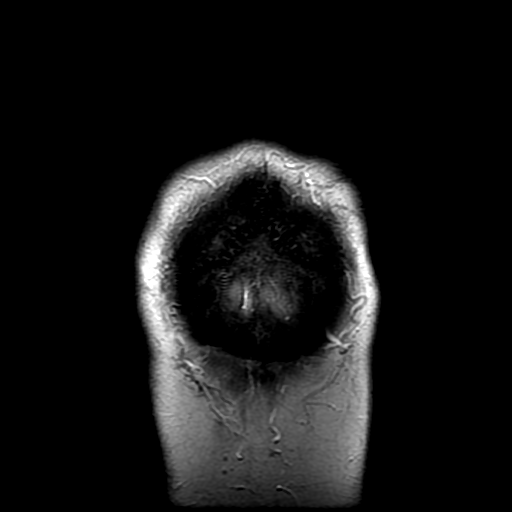
[im 25/25]
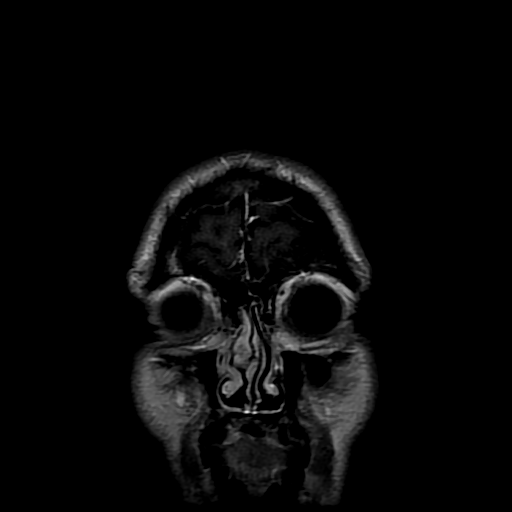

[Series 15: T1 post-contrast · sagittal · 5.0mm · 0.47mm/px · 2 of 23 slices shown (2 of 2)]
[im 1/23]
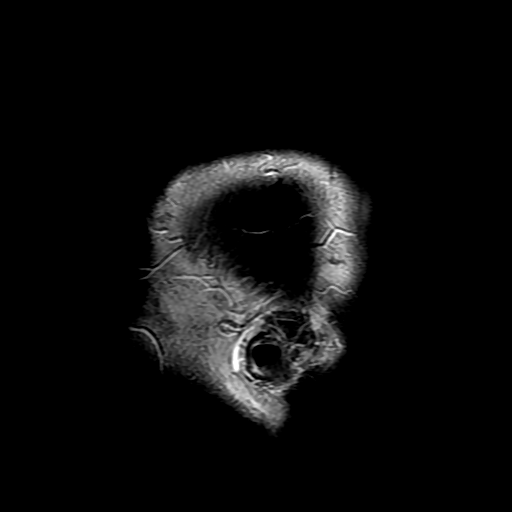
[im 23/23]
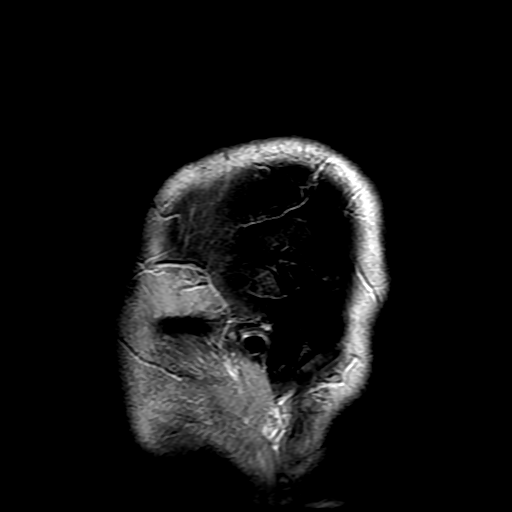

[Series 400: DWI · axial · 3.0mm · 1.09mm/px · z∈[-92,+51]mm · 4 of 49 slices shown (3 of 4)]
[im 1/49]
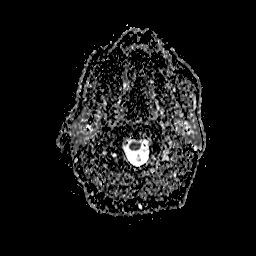
[im 17/49]
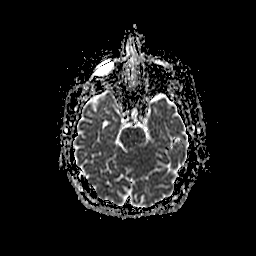
[im 33/49]
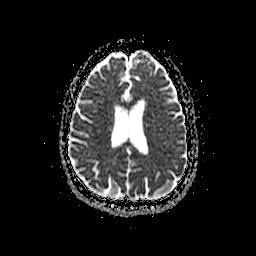
[im 49/49]
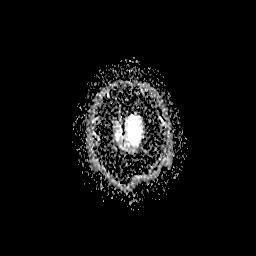

[Series 600: DWI · coronal · 5.0mm · 1.09mm/px · 3 of 33 slices shown (4 of 4)]
[im 1/33]
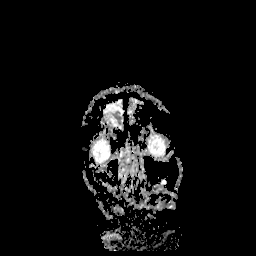
[im 17/33]
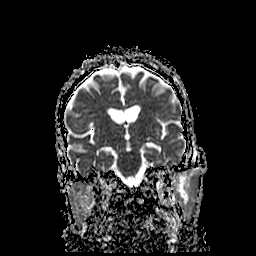
[im 33/33]
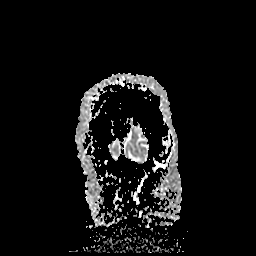

[33 of 48 positions shown; findings below may reference images not displayed]

FINDINGS: Calvarium and upper cervical spine: No focal marrow signal
abnormality.

Orbits: No significant findings.

Sinuses: Retention cysts in the adenoid. No paranasal sinus or
mastoid obstruction.

Brain: There is diffusely enhancing mass located in the lower left
cerebellum with growth across the inferior cerebellar peduncle into
the dorsal lateral medulla. The mass measures 32 x 25 x 37 mm. T2
hypo intensity and partial diffusion restriction suggests dense
cellularity. There could be minimal blood products internally, but
no measurable hematoma. No additional mass lesion is seen. Vasogenic
edema surrounds the mass, with crowding in the posterior fossa and
upper displacement of the cerebellum. The lower fourth ventricle it
is partially effaced, but there is no hydrocephalus.

No infarct, major vessel occlusion, for significant white matter
disease.
IMPRESSION: 1. 32 x 25 x 37 mm densely cellular left cerebellar mass which
extends into the left inferior peduncle and medulla. Given patient's
right upper lobe mass, this is most consistent with a solitary
metastasis.
2. Vasogenic edema partially effaces the lower fourth ventricle. No
hydrocephalus.

## 2016-08-02 IMAGING — CR DG CHEST 1V PORT
1 series · 1 of 1 positions shown · non-contrast
Comparison: None.

CLINICAL DATA: Evaluate for pneumothorax after lung biopsy

EXAM:
PORTABLE CHEST - 1 VIEW

[AP]
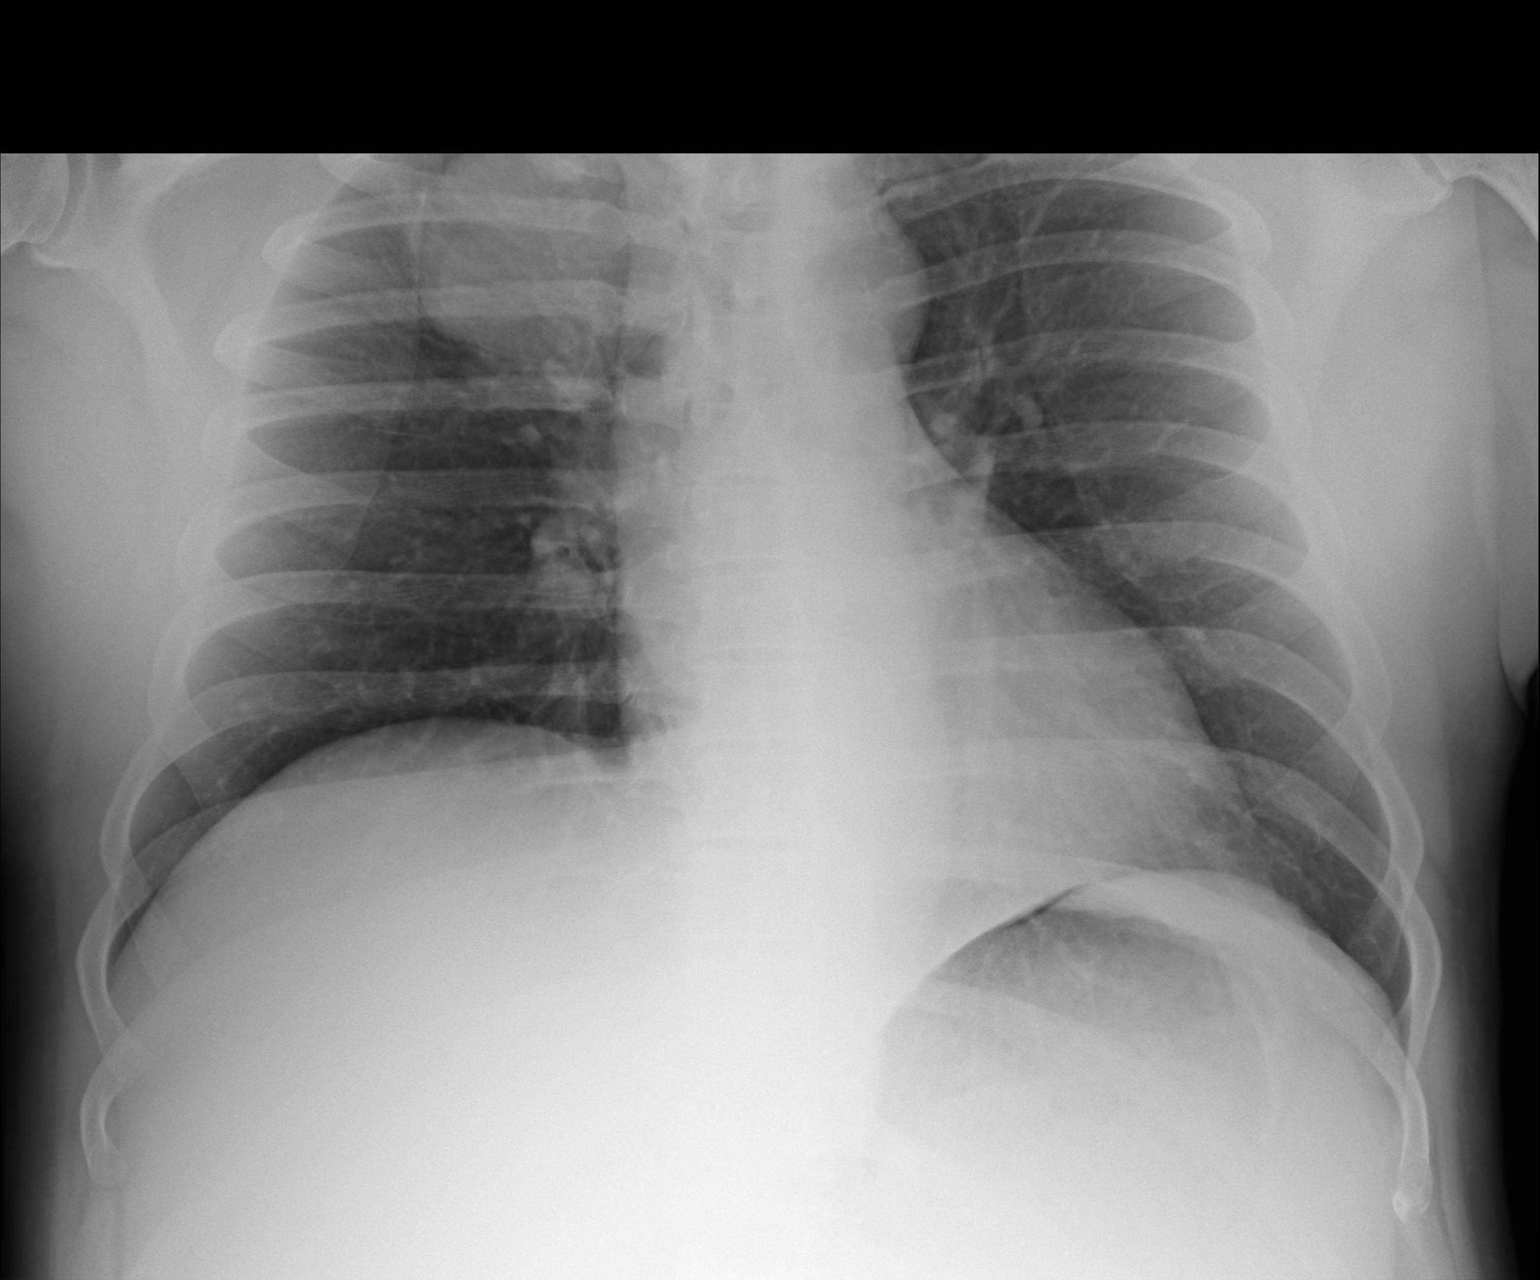

[1 of 1 positions shown; findings below may reference images not displayed]

FINDINGS: The heart size and mediastinal contours are within normal limits.
Right upper lobe lung mass is again noted. No pneumothorax
identified. The visualized skeletal structures are unremarkable.
IMPRESSION: 1. No pneumothorax noted following lung biopsy.
